# Patient Record
Sex: Female | Born: 1970
Health system: Southern US, Community
[De-identification: ages and names within clinical notes are randomized; demographics above are authoritative.]

## PROBLEM LIST (undated history)

## (undated) DIAGNOSIS — F419 Anxiety disorder, unspecified: Secondary | ICD-10-CM

## (undated) DIAGNOSIS — M792 Neuralgia and neuritis, unspecified: Secondary | ICD-10-CM

## (undated) DIAGNOSIS — K219 Gastro-esophageal reflux disease without esophagitis: Secondary | ICD-10-CM

## (undated) DIAGNOSIS — M1990 Unspecified osteoarthritis, unspecified site: Secondary | ICD-10-CM

## (undated) DIAGNOSIS — K589 Irritable bowel syndrome without diarrhea: Secondary | ICD-10-CM

## (undated) DIAGNOSIS — E079 Disorder of thyroid, unspecified: Secondary | ICD-10-CM

## (undated) DIAGNOSIS — J45909 Unspecified asthma, uncomplicated: Secondary | ICD-10-CM

## (undated) DIAGNOSIS — R42 Dizziness and giddiness: Secondary | ICD-10-CM

## (undated) DIAGNOSIS — R519 Headache, unspecified: Secondary | ICD-10-CM

## (undated) DIAGNOSIS — R112 Nausea with vomiting, unspecified: Secondary | ICD-10-CM

## (undated) DIAGNOSIS — Z9889 Other specified postprocedural states: Secondary | ICD-10-CM

## (undated) DIAGNOSIS — F429 Obsessive-compulsive disorder, unspecified: Secondary | ICD-10-CM

## (undated) HISTORY — DX: Unspecified asthma, uncomplicated: J45.909

## (undated) HISTORY — PX: WISDOM TOOTH EXTRACTION: SHX21

## (undated) HISTORY — PX: TUBAL LIGATION: SHX77

## (undated) HISTORY — DX: Unspecified osteoarthritis, unspecified site: M19.90

## (undated) HISTORY — DX: Anxiety disorder, unspecified: F41.9

## (undated) HISTORY — DX: Gastro-esophageal reflux disease without esophagitis: K21.9

## (undated) HISTORY — PX: TONSILLECTOMY: SUR1361

## (undated) HISTORY — DX: Disorder of thyroid, unspecified: E07.9

## (undated) HISTORY — DX: Obsessive-compulsive disorder, unspecified: F42.9

---

## 1998-11-30 ENCOUNTER — Other Ambulatory Visit: Admission: RE | Admit: 1998-11-30 | Discharge: 1998-11-30 | Payer: Self-pay | Admitting: Family Medicine

## 1999-12-27 ENCOUNTER — Encounter: Payer: Self-pay | Admitting: Family Medicine

## 1999-12-27 ENCOUNTER — Ambulatory Visit (HOSPITAL_COMMUNITY): Admission: RE | Admit: 1999-12-27 | Discharge: 1999-12-27 | Payer: Self-pay | Admitting: Family Medicine

## 2000-01-24 ENCOUNTER — Other Ambulatory Visit: Admission: RE | Admit: 2000-01-24 | Discharge: 2000-01-24 | Payer: Self-pay | Admitting: Family Medicine

## 2000-02-19 ENCOUNTER — Inpatient Hospital Stay (HOSPITAL_COMMUNITY): Admission: AD | Admit: 2000-02-19 | Discharge: 2000-02-19 | Payer: Self-pay | Admitting: Family Medicine

## 2000-03-05 ENCOUNTER — Ambulatory Visit (HOSPITAL_COMMUNITY): Admission: RE | Admit: 2000-03-05 | Discharge: 2000-03-05 | Payer: Self-pay | Admitting: Family Medicine

## 2000-03-05 ENCOUNTER — Encounter: Payer: Self-pay | Admitting: Family Medicine

## 2000-08-12 ENCOUNTER — Inpatient Hospital Stay (HOSPITAL_COMMUNITY): Admission: AD | Admit: 2000-08-12 | Discharge: 2000-08-12 | Payer: Self-pay | Admitting: Family Medicine

## 2000-08-14 ENCOUNTER — Inpatient Hospital Stay (HOSPITAL_COMMUNITY): Admission: AD | Admit: 2000-08-14 | Discharge: 2000-08-17 | Payer: Self-pay | Admitting: Family Medicine

## 2001-08-30 ENCOUNTER — Encounter: Payer: Self-pay | Admitting: Family Medicine

## 2001-08-30 ENCOUNTER — Inpatient Hospital Stay (HOSPITAL_COMMUNITY): Admission: AD | Admit: 2001-08-30 | Discharge: 2001-08-30 | Payer: Self-pay | Admitting: Family Medicine

## 2001-09-14 ENCOUNTER — Other Ambulatory Visit: Admission: RE | Admit: 2001-09-14 | Discharge: 2001-09-14 | Payer: Self-pay | Admitting: Family Medicine

## 2001-12-21 ENCOUNTER — Ambulatory Visit (HOSPITAL_COMMUNITY): Admission: RE | Admit: 2001-12-21 | Discharge: 2001-12-21 | Payer: Self-pay | Admitting: Family Medicine

## 2001-12-21 ENCOUNTER — Encounter: Payer: Self-pay | Admitting: Family Medicine

## 2002-02-17 ENCOUNTER — Encounter: Payer: Self-pay | Admitting: Family Medicine

## 2002-02-17 ENCOUNTER — Ambulatory Visit (HOSPITAL_COMMUNITY): Admission: RE | Admit: 2002-02-17 | Discharge: 2002-02-17 | Payer: Self-pay | Admitting: Family Medicine

## 2002-06-23 ENCOUNTER — Inpatient Hospital Stay (HOSPITAL_COMMUNITY): Admission: AD | Admit: 2002-06-23 | Discharge: 2002-06-25 | Payer: Self-pay | Admitting: Family Medicine

## 2004-04-06 ENCOUNTER — Other Ambulatory Visit: Admission: RE | Admit: 2004-04-06 | Discharge: 2004-04-06 | Payer: Self-pay | Admitting: Family Medicine

## 2004-05-09 ENCOUNTER — Ambulatory Visit (HOSPITAL_COMMUNITY): Admission: RE | Admit: 2004-05-09 | Discharge: 2004-05-09 | Payer: Self-pay | Admitting: Family Medicine

## 2004-08-14 ENCOUNTER — Ambulatory Visit (HOSPITAL_COMMUNITY): Admission: RE | Admit: 2004-08-14 | Discharge: 2004-08-14 | Payer: Self-pay | Admitting: Family Medicine

## 2004-10-12 ENCOUNTER — Inpatient Hospital Stay (HOSPITAL_COMMUNITY): Admission: AD | Admit: 2004-10-12 | Discharge: 2004-10-13 | Payer: Self-pay | Admitting: Family Medicine

## 2004-12-14 ENCOUNTER — Ambulatory Visit: Payer: Self-pay | Admitting: Family Medicine

## 2004-12-14 ENCOUNTER — Inpatient Hospital Stay (HOSPITAL_COMMUNITY): Admission: AD | Admit: 2004-12-14 | Discharge: 2004-12-16 | Payer: Self-pay | Admitting: Family Medicine

## 2006-03-28 ENCOUNTER — Other Ambulatory Visit: Admission: RE | Admit: 2006-03-28 | Discharge: 2006-03-28 | Payer: Self-pay | Admitting: Family Medicine

## 2011-04-23 ENCOUNTER — Other Ambulatory Visit: Payer: Self-pay | Admitting: Obstetrics and Gynecology

## 2011-04-23 DIAGNOSIS — N6452 Nipple discharge: Secondary | ICD-10-CM

## 2011-05-14 ENCOUNTER — Ambulatory Visit
Admission: RE | Admit: 2011-05-14 | Discharge: 2011-05-14 | Disposition: A | Discharge status: Home / Self Care | Payer: 59 | Source: Ambulatory Visit | Attending: Obstetrics and Gynecology | Admitting: Obstetrics and Gynecology

## 2011-05-14 DIAGNOSIS — N6452 Nipple discharge: Secondary | ICD-10-CM

## 2011-09-12 ENCOUNTER — Other Ambulatory Visit: Payer: Self-pay | Admitting: Family Medicine

## 2011-09-12 ENCOUNTER — Ambulatory Visit (HOSPITAL_COMMUNITY)
Admission: RE | Admit: 2011-09-12 | Discharge: 2011-09-12 | Disposition: A | Discharge status: Home / Self Care | Payer: 59 | Source: Ambulatory Visit | Attending: Family Medicine | Admitting: Family Medicine

## 2011-09-12 DIAGNOSIS — M79605 Pain in left leg: Secondary | ICD-10-CM

## 2011-09-12 DIAGNOSIS — M79609 Pain in unspecified limb: Secondary | ICD-10-CM | POA: Insufficient documentation

## 2011-09-12 DIAGNOSIS — M7989 Other specified soft tissue disorders: Secondary | ICD-10-CM

## 2011-09-27 ENCOUNTER — Other Ambulatory Visit: Payer: Self-pay | Admitting: Family Medicine

## 2011-09-27 DIAGNOSIS — Z1231 Encounter for screening mammogram for malignant neoplasm of breast: Secondary | ICD-10-CM

## 2011-12-06 ENCOUNTER — Ambulatory Visit: Payer: 59 | Attending: Physician Assistant | Admitting: Physical Therapy

## 2011-12-06 DIAGNOSIS — R5381 Other malaise: Secondary | ICD-10-CM | POA: Insufficient documentation

## 2011-12-06 DIAGNOSIS — M25569 Pain in unspecified knee: Secondary | ICD-10-CM | POA: Insufficient documentation

## 2011-12-06 DIAGNOSIS — IMO0001 Reserved for inherently not codable concepts without codable children: Secondary | ICD-10-CM | POA: Insufficient documentation

## 2011-12-10 ENCOUNTER — Encounter: Payer: 59 | Admitting: Physical Therapy

## 2012-01-16 ENCOUNTER — Ambulatory Visit
Admission: RE | Admit: 2012-01-16 | Discharge: 2012-01-16 | Disposition: A | Discharge status: Home / Self Care | Payer: 59 | Source: Ambulatory Visit | Attending: Family Medicine | Admitting: Family Medicine

## 2012-01-16 DIAGNOSIS — Z1231 Encounter for screening mammogram for malignant neoplasm of breast: Secondary | ICD-10-CM

## 2012-05-25 ENCOUNTER — Encounter (INDEPENDENT_AMBULATORY_CARE_PROVIDER_SITE_OTHER): Payer: Self-pay | Admitting: Surgery

## 2012-05-26 ENCOUNTER — Encounter (INDEPENDENT_AMBULATORY_CARE_PROVIDER_SITE_OTHER): Payer: Self-pay | Admitting: Surgery

## 2012-05-26 ENCOUNTER — Ambulatory Visit (INDEPENDENT_AMBULATORY_CARE_PROVIDER_SITE_OTHER): Payer: Commercial Managed Care - PPO | Admitting: Surgery

## 2012-05-26 VITALS — BP 94/68 | HR 73 | Temp 98.6°F | Resp 18 | Ht 67.0 in | Wt 153.0 lb

## 2012-05-26 DIAGNOSIS — R599 Enlarged lymph nodes, unspecified: Secondary | ICD-10-CM

## 2012-05-26 DIAGNOSIS — R590 Localized enlarged lymph nodes: Secondary | ICD-10-CM

## 2012-05-26 NOTE — Progress Notes (Signed)
Subjective:     Patient ID: Katherine Hays, female   DOB: 1971-04-22, 41 y.o.   MRN: [id number]  HPI She is referred by Dr. Christell Constant for evaluation of a tender nodule in her left groin. She reports has been there approximately 2 months. She denies any trauma to her lower extremities. She has had no constitutional symptoms. She denies fevers or chills. She denies any erythema or drainage of this area.   Review of Systems     Objective:   Physical Exam On exam, there is a less than 1 cm large lymph node in her left groin. It is nontender today and there is no erythema    Assessment:     Left inguinal lymphadenopathy    Plan:     I believe this is a hyperplastic lymph node. She has no other adenopathy. I discussed expected management versus surgical excision for histologic evaluation. She wishes to follow this expectantly. I believe this is totally reasonable. She will give some more time and if it is still causing her discomfort, she will come back and see me and we'll readdress excision. Again, I have a CBC on her which is completely normal

## 2012-10-15 ENCOUNTER — Encounter: Payer: Self-pay | Admitting: Gastroenterology

## 2012-10-27 ENCOUNTER — Encounter: Payer: Self-pay | Admitting: Gastroenterology

## 2012-10-27 ENCOUNTER — Ambulatory Visit (INDEPENDENT_AMBULATORY_CARE_PROVIDER_SITE_OTHER): Payer: 59 | Admitting: Gastroenterology

## 2012-10-27 ENCOUNTER — Other Ambulatory Visit (INDEPENDENT_AMBULATORY_CARE_PROVIDER_SITE_OTHER): Payer: 59

## 2012-10-27 VITALS — BP 120/78 | HR 88 | Ht 66.5 in | Wt 157.0 lb

## 2012-10-27 DIAGNOSIS — R197 Diarrhea, unspecified: Secondary | ICD-10-CM

## 2012-10-27 LAB — CBC WITH DIFFERENTIAL/PLATELET
Basophils Relative: 0.5 % (ref 0.0–3.0)
Eosinophils Absolute: 0.2 10*3/uL (ref 0.0–0.7)
HCT: 38.3 % (ref 36.0–46.0)
MCHC: 33.8 g/dL (ref 30.0–36.0)
MCV: 88.1 fl (ref 78.0–100.0)
Monocytes Relative: 7.9 % (ref 3.0–12.0)
Neutro Abs: 2.7 10*3/uL (ref 1.4–7.7)
Neutrophils Relative %: 52.5 % (ref 43.0–77.0)
Platelets: 275 10*3/uL (ref 150.0–400.0)
RBC: 4.35 Mil/uL (ref 3.87–5.11)
RDW: 12.7 % (ref 11.5–14.6)
WBC: 5.1 10*3/uL (ref 4.5–10.5)

## 2012-10-27 LAB — COMPREHENSIVE METABOLIC PANEL
Calcium: 8.9 mg/dL (ref 8.4–10.5)
Glucose, Bld: 97 mg/dL (ref 70–99)
Potassium: 4 mEq/L (ref 3.5–5.1)
Sodium: 138 mEq/L (ref 135–145)

## 2012-10-27 MED ORDER — PEG-KCL-NACL-NASULF-NA ASC-C 100 G PO SOLR: 1.0000 | Freq: Once | ORAL | Status: DC

## 2012-10-27 NOTE — Progress Notes (Signed)
HPI: This is a    very pleasant 42 year old woman whom I am meeting for the first time today.  2007 diarrhea for 2 weeks, eventually proven to have salmonella.  Ever since then intermittent diarrhea.  Recently minor rectal incontinence.  No rectal bleeding.    She will have 2-3 loose, mushy, watery stools per day.  Very rarely solid stools. Minor cramping in RUQ.    Has not tried anything to slow down her stools not imodium or fiber or probiotics.  Nephew has crohn's  Colicy pains in RUQ, occurs 1 time per month, stays about 2-3 seconds.   Drinks 3-4 sodas, coffee in am, tea at lunch.  Non etoh drinker.   Review of systems: Pertinent positive and negative review of systems were noted in the above HPI section. Complete review of systems was performed and was otherwise normal.    Past Medical History  Diagnosis Date  . Anxiety   . Asthma     Past Surgical History  Procedure Laterality Date  . Tubal ligation    . Tonsillectomy      Current Outpatient Prescriptions  Medication Sig Dispense Refill  . ALPRAZolam (XANAX PO) Take by mouth.      . Sertraline HCl (ZOLOFT PO) Take by mouth.       No current facility-administered medications for this visit.    Allergies as of 10/27/2012 - Review Complete 10/27/2012  Allergen Reaction Noted  . Codeine  05/25/2012    Family History  Problem Relation Age of Onset  . Liver disease Father   . Pancreatic cancer Maternal Uncle   . Diabetes Maternal Grandfather   . Breast cancer Paternal Grandmother   . Heart disease Paternal Grandfather   . Hyperthyroidism Mother   . Hypertension Brother   . Crohn's disease      Nephew    History   Social History  . Marital Status: Married    Spouse Name: N/A    Number of Children: 3  . Years of Education: N/A   Occupational History  . Front Office [employer]   Social History Main Topics  . Smoking status: Never Smoker   . Smokeless tobacco: Never Used  . Alcohol Use: No  .  Drug Use: No  . Sexually Active: Not on file   Other Topics Concern  . Not on file   Social History Narrative  . No narrative on file       Physical Exam: BP 120/78  Pulse 88  Ht 5' 6.5" (1.689 m)  Wt 157 lb (71.215 kg)  BMI 24.96 kg/m2  LMP 10/20/2012 Constitutional: generally well-appearing Psychiatric: alert and oriented x3 Eyes: extraocular movements intact Mouth: oral pharynx moist, no lesions Neck: supple no lymphadenopathy Cardiovascular: heart regular rate and rhythm Lungs: clear to auscultation bilaterally Abdomen: soft, nontender, nondistended, no obvious ascites, no peritoneal signs, normal bowel sounds Extremities: no lower extremity edema bilaterally Skin: no lesions on visible extremities    Assessment and plan: 43 y.o. 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 She does have Crohn's in her family and inflammatory conditions could cause similar problems as hers. She drinks 6 caffeinated beverages per day and that can also contribute loose stools. I would like to proceed with colonoscopy at her soonest convenience. In the meantime she will try to cut back on her caffeine and then possibly try a single Imodium once daily. She will get a basic set of labs today  including CBC, complete metabolic profile, celiac sprue panel, stool testing.

## 2012-10-27 NOTE — Patient Instructions (Addendum)
You will be set up for a colonoscopy for diarrhea (LEC moderate sedation). You will have labs checked today in the basement lab.  Please head down after you check out with the front desk  (stool tests for c. Diff by pcr, routine culture, ova parasites, celiac panel, cbc, cmet). Caffeine can contribute to diarrhea, try cutting back if you can. If cutting back on caffeine doesn't help, then try a single OTC imodium once every morning.                                                We are excited to introduce MyChart, a new best-in-class service that provides you online access to important information in your electronic medical record. We want to make it easier for you to view your health information - all in one secure location - when and where you need it. We expect MyChart will enhance the quality of care and service we provide.  When you register for MyChart, you can:    View your test results.    Request appointments and receive appointment reminders via email.    Request medication renewals.    View your medical history, allergies, medications and immunizations.    Communicate with your physician's office through a password-protected site.    Conveniently print information such as your medication lists.  To find out if MyChart is right for you, please talk to a member of our clinical staff today. We will gladly answer your questions about this free health and wellness tool.  If you are age 30 or older and want a member of your family to have access to your record, you must provide written consent by completing a proxy form available at our office. Please speak to our clinical staff about guidelines regarding accounts for patients younger than age 56.  As you activate your MyChart account and need any technical assistance, please call the MyChart technical support line at [phone]) or email your question to [email or web address]. If you email your question(s),  please include your name, a return phone number and the best time to reach you.  If you have non-urgent health-related questions, you can send a message to our office through MyChart at [email or web address]. If you have a medical emergency, call 911.  Thank you for using MyChart as your new health and wellness resource!   MyChart licensed from [employer],  [id number]. Patents Pending.

## 2012-10-28 LAB — CELIAC PANEL 10
Endomysial Screen: NEGATIVE
Gliadin IgA: 4.8 U/mL (ref <20)
Gliadin IgG: 10.8 U/mL (ref <20)
IgA: 167 mg/dL (ref 69–380)
Tissue Transglut Ab: 7.2 U/mL (ref <20)
Tissue Transglutaminase Ab, IgA: 3.8 U/mL (ref <20)

## 2012-11-02 ENCOUNTER — Encounter: Payer: Self-pay | Admitting: Gastroenterology

## 2012-11-02 ENCOUNTER — Ambulatory Visit (AMBULATORY_SURGERY_CENTER): Payer: 59 | Admitting: Gastroenterology

## 2012-11-02 VITALS — BP 98/58 | HR 67 | Temp 98.3°F | Resp 18 | Ht 66.5 in | Wt 157.0 lb

## 2012-11-02 DIAGNOSIS — D126 Benign neoplasm of colon, unspecified: Secondary | ICD-10-CM

## 2012-11-02 DIAGNOSIS — R197 Diarrhea, unspecified: Secondary | ICD-10-CM

## 2012-11-02 MED ORDER — SODIUM CHLORIDE 0.9 % IV SOLN: 500.0000 mL | INTRAVENOUS | Status: DC

## 2012-11-02 NOTE — Patient Instructions (Addendum)
YOU HAD AN ENDOSCOPIC PROCEDURE TODAY AT THE [facility]: Refer to the procedure report that was given to you for any specific questions about what was found during the examination.  If the procedure report does not answer your questions, please call your gastroenterologist to clarify.  If you requested that your care partner not be given the details of your procedure findings, then the procedure report has been included in a sealed envelope for you to review at your convenience later.  YOU SHOULD EXPECT: Some feelings of bloating in the abdomen. Passage of more gas than usual.  Walking can help get rid of the air that was put into your GI tract during the procedure and reduce the bloating. If you had a lower endoscopy (such as a colonoscopy or flexible sigmoidoscopy) you may notice spotting of blood in your stool or on the toilet paper. If you underwent a bowel prep for your procedure, then you may not have a normal bowel movement for a few days.  DIET: Your first meal following the procedure should be a light meal and then it is ok to progress to your normal diet.  A half-sandwich or bowl of soup is an example of a good first meal.  Heavy or fried foods are harder to digest and may make you feel nauseous or bloated.  Likewise meals heavy in dairy and vegetables can cause extra gas to form and this can also increase the bloating.  Drink plenty of fluids but you should avoid alcoholic beverages for 24 hours.  ACTIVITY: Your care partner should take you home directly after the procedure.  You should plan to take it easy, moving slowly for the rest of the day.  You can resume normal activity the day after the procedure however you should NOT DRIVE or use heavy machinery for 24 hours (because of the sedation medicines used during the test).    SYMPTOMS TO REPORT IMMEDIATELY: A gastroenterologist can be reached at any hour.  During normal business hours, 8:30 AM to 5:00 PM Monday through Friday,  call [phone].  After hours and on weekends, please call the GI answering service at [phone] who will take a message and have the physician on call contact you.   Following lower endoscopy (colonoscopy or flexible sigmoidoscopy):  Excessive amounts of blood in the stool  Significant tenderness or worsening of abdominal pains  Swelling of the abdomen that is new, acute  Fever of 100F or higher  Following upper endoscopy (EGD)  Vomiting of blood or coffee ground material  New chest pain or pain under the shoulder blades  Painful or persistently difficult swallowing  New shortness of breath  Fever of 100F or higher  Black, tarry-looking stools  FOLLOW UP: If any biopsies were taken you will be contacted by phone or by letter within the next 1-3 weeks.  Call your gastroenterologist if you have not heard about the biopsies in 3 weeks.  Our staff will call the home number listed on your records the next business day following your procedure to check on you and address any questions or concerns that you may have at that time regarding the information given to you following your procedure. This is a courtesy call and so if there is no answer at the home number and we have not heard from you through the emergency physician on call, we will assume that you have returned to your regular daily activities without incident.  SIGNATURES/CONFIDENTIALITY: You and/or your care   partner have signed paperwork which will be entered into your electronic medical record.  These signatures attest to the fact that that the information above on your After Visit Summary has been reviewed and is understood.  Full responsibility of the confidentiality of this discharge information lies with you and/or your care-partner.  

## 2012-11-02 NOTE — Op Note (Signed)
[facility] [address] [place], [address]   COLONOSCOPY PROCEDURE REPORT  PATIENT: Katherine Hays, Katherine Hays  MR#: [id number] BIRTHDATE: [birth year] , 41  yrs. old GENDER: Female ENDOSCOPIST: Rachael Fee, MD REFERRED XB:JYNW Sheron Nightingale, N.P. PROCEDURE DATE:  11/02/2012 PROCEDURE:   Colonoscopy with biopsy ASA CLASS:   Class II INDICATIONS:chronic loose stools, cousin with Crohn's. MEDICATIONS: Fentanyl 100 mcg IV, Versed 10 mg IV, and These medications were titrated to patient response per physician's verbal order  DESCRIPTION OF PROCEDURE:   After the risks benefits and alternatives of the procedure were thoroughly explained, informed consent was obtained.  A digital rectal exam revealed no abnormalities of the rectum.   The LB [id number]  endoscope was introduced through the anus and advanced to the terminal ileum which was intubated for a short distance. No adverse events experienced.   The quality of the prep was good.  The instrument was then slowly withdrawn as the colon was fully examined.   COLON FINDINGS: The mucosa appeared normal in the terminal ileum. A normal appearing cecum, ileocecal valve, and appendiceal orifice were identified.  The ascending, hepatic flexure, transverse, splenic flexure, descending, sigmoid colon and rectum appeared unremarkable.  No polyps or cancers were seen.  The colonic mucosa was randomly biopsied and sent to pathology.  Retroflexed views revealed no abnormalities. The time to cecum=4 minutes 38 seconds. Withdrawal time=7 minutes 00 seconds.  The scope was withdrawn and the procedure completed. COMPLICATIONS: There were no complications.  ENDOSCOPIC IMPRESSION: 1.   Normal mucosa in the terminal ileum 2.   Normal colon, biopsied to check for microscopic colitis.  RECOMMENDATIONS: Await final biopsy report.  Please start once daily imodium, one pill shortly after waking up every morning.   eSigned:   Rachael Fee, MD 11/02/2012 3:44 PM

## 2012-11-02 NOTE — Progress Notes (Signed)
Patient did not experience any of the following events: a burn prior to discharge; a fall within the facility; wrong site/side/patient/procedure/implant event; or a hospital transfer or hospital admission upon discharge from the facility. (G8907) Patient did not have preoperative order for IV antibiotic SSI prophylaxis. (G8918)  

## 2012-11-03 ENCOUNTER — Telehealth: Payer: Self-pay

## 2012-11-03 NOTE — Telephone Encounter (Signed)
  Follow up Call-  Call back number 11/02/2012  Post procedure Call Back phone  # [phone]  Permission to leave phone message Yes     Patient questions:  Do you have a fever, pain , or abdominal swelling? no Pain Score  0 *  Have you tolerated food without any problems? yes  Have you been able to return to your normal activities? yes  Do you have any questions about your discharge instructions: Diet   no Medications  no Follow up visit  no  Do you have questions or concerns about your Care? no  Actions: * If pain score is 4 or above: No action needed, pain <4.

## 2012-11-06 ENCOUNTER — Encounter: Payer: Self-pay | Admitting: Gastroenterology

## 2012-11-07 ENCOUNTER — Encounter: Payer: Self-pay | Admitting: Nurse Practitioner

## 2012-11-17 ENCOUNTER — Encounter: Payer: Self-pay | Admitting: *Deleted

## 2012-11-18 ENCOUNTER — Other Ambulatory Visit: Payer: Self-pay | Admitting: Obstetrics and Gynecology

## 2012-12-14 ENCOUNTER — Other Ambulatory Visit: Payer: Self-pay | Admitting: *Deleted

## 2012-12-14 MED ORDER — ALPRAZOLAM 0.5 MG PO TABS: ORAL_TABLET | ORAL | Status: DC

## 2012-12-14 NOTE — Telephone Encounter (Signed)
Called in.

## 2012-12-14 NOTE — Telephone Encounter (Signed)
LAST RF 09/18/12. LAST OV 08/28/12 WITH [facility]. PLEASE CALL IN AND LET PT KNOW

## 2012-12-14 NOTE — Telephone Encounter (Signed)
Please call in Xanax 0.5 mg 1 po bid #[phone] refills

## 2013-01-04 ENCOUNTER — Telehealth: Payer: Self-pay | Admitting: Nurse Practitioner

## 2013-01-04 MED ORDER — FLUCONAZOLE 150 MG PO TABS: 150.0000 mg | ORAL_TABLET | Freq: Once | ORAL | Status: DC

## 2013-01-04 NOTE — Telephone Encounter (Signed)
rx sent to pharmacy

## 2013-02-15 ENCOUNTER — Other Ambulatory Visit: Payer: Self-pay | Admitting: Nurse Practitioner

## 2013-02-16 NOTE — Telephone Encounter (Signed)
Last seen 08/2012   [facility]    If approved phone in and have nurse notify patient

## 2013-02-16 NOTE — Telephone Encounter (Signed)
Please call in xanax rx with 2 refills 

## 2013-02-16 NOTE — Telephone Encounter (Signed)
RX called to pharmacy.

## 2013-04-12 ENCOUNTER — Encounter: Payer: Self-pay | Admitting: General Practice

## 2013-04-12 ENCOUNTER — Ambulatory Visit (INDEPENDENT_AMBULATORY_CARE_PROVIDER_SITE_OTHER): Payer: 59 | Admitting: General Practice

## 2013-04-12 VITALS — BP 102/63 | HR 78 | Temp 97.1°F | Ht 67.0 in | Wt 159.0 lb

## 2013-04-12 DIAGNOSIS — J069 Acute upper respiratory infection, unspecified: Secondary | ICD-10-CM

## 2013-04-12 DIAGNOSIS — J322 Chronic ethmoidal sinusitis: Secondary | ICD-10-CM

## 2013-04-12 MED ORDER — AZITHROMYCIN 250 MG PO TABS: ORAL_TABLET | ORAL | Status: DC

## 2013-04-12 NOTE — Patient Instructions (Signed)

## 2013-04-12 NOTE — Progress Notes (Signed)
  Subjective:    Patient ID: Katherine Hays, female    DOB: [birth year], 42 y.o.   MRN: [id number]  Sinusitis This is a new problem. The current episode started yesterday. The problem has been gradually worsening since onset. There has been no fever. Associated symptoms include congestion, coughing, sinus pressure and sneezing. Pertinent negatives include no chills, headaches, shortness of breath or sore throat. Past treatments include nothing.  reports last menstrual cycle 2 weeks ago.     Review of Systems  Constitutional: Negative for fever and chills.  HENT: Positive for congestion, rhinorrhea, sneezing, postnasal drip and sinus pressure. Negative for sore throat.   Respiratory: Positive for cough. Negative for chest tightness and shortness of breath.   Cardiovascular: Negative for chest pain and palpitations.  Neurological: Negative for dizziness, weakness and headaches.       Objective:   Physical Exam  Constitutional: She is oriented to person, place, and time. She appears well-developed and well-nourished.  HENT:  Head: Normocephalic and atraumatic.  Right Ear: External ear normal.  Left Ear: External ear normal.  Nose: Right sinus exhibits maxillary sinus tenderness. Left sinus exhibits maxillary sinus tenderness.  Eyes: EOM are normal. Pupils are equal, round, and reactive to light.  Cardiovascular: Normal rate, regular rhythm and normal heart sounds.   Pulmonary/Chest: Effort normal and breath sounds normal.  Neurological: She is alert and oriented to person, place, and time.  Skin: Skin is warm and dry.  Psychiatric: She has a normal mood and affect.          Assessment & Plan:  1. Ethmoid sinusitis and 2. Acute upper respiratory infections of unspecified site - azithromycin (ZITHROMAX) 250 MG tablet; Take as directed  Dispense: 6 tablet; Refill: 0 -increase fluid intake -RTO if symptoms worsen or unresolved -Patient verbalized understanding Coralie Keens, FNP-C

## 2013-06-01 ENCOUNTER — Ambulatory Visit (INDEPENDENT_AMBULATORY_CARE_PROVIDER_SITE_OTHER): Payer: 59 | Admitting: *Deleted

## 2013-06-01 DIAGNOSIS — Z23 Encounter for immunization: Secondary | ICD-10-CM

## 2013-06-22 ENCOUNTER — Other Ambulatory Visit (HOSPITAL_COMMUNITY)
Admission: RE | Admit: 2013-06-22 | Discharge: 2013-06-22 | Disposition: A | Discharge status: Home / Self Care | Payer: 59 | Source: Ambulatory Visit | Attending: Advanced Practice Midwife | Admitting: Advanced Practice Midwife

## 2013-06-22 ENCOUNTER — Ambulatory Visit (INDEPENDENT_AMBULATORY_CARE_PROVIDER_SITE_OTHER): Payer: 59 | Admitting: Advanced Practice Midwife

## 2013-06-22 ENCOUNTER — Ambulatory Visit (INDEPENDENT_AMBULATORY_CARE_PROVIDER_SITE_OTHER): Payer: 59 | Admitting: General Practice

## 2013-06-22 ENCOUNTER — Encounter: Payer: Self-pay | Admitting: General Practice

## 2013-06-22 ENCOUNTER — Ambulatory Visit (INDEPENDENT_AMBULATORY_CARE_PROVIDER_SITE_OTHER): Payer: 59

## 2013-06-22 ENCOUNTER — Encounter: Payer: Self-pay | Admitting: Advanced Practice Midwife

## 2013-06-22 VITALS — BP 105/66 | HR 73 | Temp 97.0°F | Wt 156.0 lb

## 2013-06-22 VITALS — BP 110/70 | Ht 66.5 in | Wt 159.0 lb

## 2013-06-22 DIAGNOSIS — M25519 Pain in unspecified shoulder: Secondary | ICD-10-CM

## 2013-06-22 DIAGNOSIS — M542 Cervicalgia: Secondary | ICD-10-CM

## 2013-06-22 DIAGNOSIS — M25512 Pain in left shoulder: Secondary | ICD-10-CM

## 2013-06-22 DIAGNOSIS — Z01419 Encounter for gynecological examination (general) (routine) without abnormal findings: Secondary | ICD-10-CM | POA: Insufficient documentation

## 2013-06-22 DIAGNOSIS — IMO0002 Reserved for concepts with insufficient information to code with codable children: Secondary | ICD-10-CM

## 2013-06-22 DIAGNOSIS — Z1151 Encounter for screening for human papillomavirus (HPV): Secondary | ICD-10-CM | POA: Insufficient documentation

## 2013-06-22 DIAGNOSIS — S46912A Strain of unspecified muscle, fascia and tendon at shoulder and upper arm level, left arm, initial encounter: Secondary | ICD-10-CM

## 2013-06-22 MED ORDER — CYCLOBENZAPRINE HCL 5 MG PO TABS: 5.0000 mg | ORAL_TABLET | Freq: Two times a day (BID) | ORAL | Status: DC | PRN

## 2013-06-22 MED ORDER — IBUPROFEN 800 MG PO TABS: 800.0000 mg | ORAL_TABLET | Freq: Three times a day (TID) | ORAL | Status: DC | PRN

## 2013-06-22 NOTE — Progress Notes (Signed)
  Subjective:    Patient ID: Katherine Hays, female    DOB: 26-Dec-1970, 42 y.o.   MRN: [id number]  HPI Patient presents today with complaints of left neck and shoulder pain. She reports the onset as yesterday. Reports neck and shoulder pain began after attempting a shoulder roll in karate class. She reports taking ibuprofen last night with minimal relief.     Review of Systems  Constitutional: Negative for fever and chills.  Respiratory: Negative for chest tightness and shortness of breath.   Cardiovascular: Negative for chest pain and palpitations.  Musculoskeletal: Positive for myalgias and neck pain. Negative for back pain and neck stiffness.       Left neck and shoulder pain  Neurological: Negative for dizziness, weakness and headaches.       Objective:   Physical Exam  Constitutional: She is oriented to person, place, and time. She appears well-developed and well-nourished.  HENT:  Head: Normocephalic and atraumatic.  Neck: No thyromegaly present.  Grimacing noted with rotation of neck toward the left side  Cardiovascular: Normal rate, regular rhythm and normal heart sounds.   Pulmonary/Chest: Effort normal and breath sounds normal.  Musculoskeletal: She exhibits tenderness. She exhibits no edema.  Left trapezius muscle tenderness with palpation and rotation of neck  Lymphadenopathy:    She has no cervical adenopathy.  Neurological: She is alert and oriented to person, place, and time.  Skin: Skin is warm and dry.  Psychiatric: She has a normal mood and affect.   [facility] reading (PRIMARY) by Coralie Keens, FNP-C, no fracture or dislocation noted.                                       Assessment & Plan:  1. Neck pain on left side  - DG Cervical Spine Complete; Future  2. Left shoulder pain  - DG Cervical Spine Complete; Future  3. Muscle strain of left shoulder, initial encounter  - cyclobenzaprine (FLEXERIL) 5 MG tablet; Take 1 tablet (5 mg total) by mouth  2 (two) times daily as needed for muscle spasms.  Dispense: 30 tablet; Refill: 0 - ibuprofen (ADVIL,MOTRIN) 800 MG tablet; Take 1 tablet (800 mg total) by mouth every 8 (eight) hours as needed for pain.  Dispense: 30 tablet; Refill: 0 -information sheet provided and discussed on muscle strain -RTO if symptoms worsen or unresolved -Patient verbalized understanding Coralie Keens, FNP-C

## 2013-06-22 NOTE — Patient Instructions (Signed)
Muscle Strain  Muscle strain occurs when a muscle is stretched beyond its normal length. A small number of muscle fibers generally are torn. This is especially common in athletes. This happens when a sudden, violent force placed on a muscle stretches it too far. Usually, recovery from muscle strain takes 1 to 2 weeks. Complete healing will take 5 to 6 weeks.   HOME CARE INSTRUCTIONS    While awake, apply ice to the sore muscle for the first 2 days after the injury.   Put ice in a plastic bag.   Place a towel between your skin and the bag.   Leave the ice on for 15-20 minutes each hour.   Do not use the strained muscle for several days, until you no longer have pain.   You may wrap the injured area with an elastic bandage for comfort. Be careful not to wrap it too tightly. This may interfere with blood circulation or increase swelling.   Only take over-the-counter or prescription medicines for pain, discomfort, or fever as directed by your caregiver.  SEEK MEDICAL CARE IF:   You have increasing pain or swelling in the injured area.  MAKE SURE YOU:    Understand these instructions.   Will watch your condition.   Will get help right away if you are not doing well or get worse.  Document Released: 08/05/2005 Document Revised: 10/28/2011 Document Reviewed: 08/17/2011  ExitCare Patient Information 2014 [employer].

## 2013-06-22 NOTE — Progress Notes (Signed)
Katherine Hays 42 y.o.  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  Saw MD today for shoulder injury in Karate class.  Had colonoscopy this summer for chronic diarrhea s/p salmonella (gone after prep!).  Has been with same partner for 27 years.  Gets financial incentive for getting pap q year.   Past Medical History: Past Medical History  Diagnosis Date  . Anxiety   . Asthma   . OCD (obsessive compulsive disorder)     Past Surgical History: Past Surgical History  Procedure Laterality Date  . Tubal ligation    . Tonsillectomy    . Wisdom tooth extraction      Family History: Family History  Problem Relation Age of Onset  . Liver disease Father     cirrhosis  . Pancreatic cancer Maternal Uncle   . Diabetes Maternal Grandfather   . Stroke Maternal Grandfather   . Breast cancer Paternal Grandmother   . Cancer Paternal Grandmother     breast  . Heart disease Paternal Grandfather   . Stroke Paternal Grandfather   . Hyperthyroidism Mother   . Hypertension Brother   . Crohn's disease      Nephew    Social History: History  Substance Use Topics  . Smoking status: Never Smoker   . Smokeless tobacco: Never Used  . Alcohol Use: No    Allergies:  Allergies  Allergen Reactions  . Codeine       Review of Systems   Patient denies any headaches, blurred vision, shortness of breath, chest pain, abdominal pain, problems with bowel movements, urination, or intercourse.   Physical Exam: General:  Well developed, well nourished, no acute distress Skin:  Warm and dry Neck:  Midline trachea, normal thyroid Lungs; Clear to auscultation bilaterally Breast:  No dominant palpable mass, retraction, or nipple discharge.  Gets mammograms q year Cardiovascular: Regular rate and rhythm Abdomen:  Soft, non tender, no hepatosplenomegaly Pelvic:  External genitalia is normal in appearance.  The vagina is normal in appearance.   The cervix is bulbous.  Uterus is felt to be normal size, shape, and contour.  No       adnexal masses or tenderness noted. Rectal: declined   Extremities:  No swelling or varicosities noted Psych:  No mood changes   Impression:  Normal GYN exam  Plan: Will get fasting labs at work

## 2013-06-25 ENCOUNTER — Telehealth: Payer: Self-pay | Admitting: *Deleted

## 2013-06-25 MED ORDER — FLUCONAZOLE 150 MG PO TABS: 150.0000 mg | ORAL_TABLET | Freq: Once | ORAL | Status: DC

## 2013-06-25 NOTE — Telephone Encounter (Signed)
Would like a diflucan. Sure she has a yeast infection

## 2013-06-30 ENCOUNTER — Other Ambulatory Visit: Payer: Self-pay | Admitting: General Practice

## 2013-06-30 ENCOUNTER — Other Ambulatory Visit (INDEPENDENT_AMBULATORY_CARE_PROVIDER_SITE_OTHER): Payer: 59

## 2013-06-30 DIAGNOSIS — N390 Urinary tract infection, site not specified: Secondary | ICD-10-CM

## 2013-06-30 LAB — POCT URINALYSIS DIPSTICK
Bilirubin, UA: NEGATIVE
Glucose, UA: NEGATIVE
Ketones, UA: NEGATIVE
Urobilinogen, UA: NEGATIVE
pH, UA: 7.5

## 2013-06-30 LAB — POCT UA - MICROSCOPIC ONLY: Crystals, Ur, HPF, POC: NEGATIVE

## 2013-06-30 MED ORDER — CIPROFLOXACIN HCL 500 MG PO TABS: 500.0000 mg | ORAL_TABLET | Freq: Two times a day (BID) | ORAL | Status: DC

## 2013-06-30 NOTE — Progress Notes (Signed)
Employee bloodwork ordered in [facility]  Account billed

## 2013-06-30 NOTE — Addendum Note (Signed)
Addended by: Roselyn Reef on: 06/30/2013 08:45 AM   Modules accepted: Orders

## 2013-07-02 ENCOUNTER — Other Ambulatory Visit: Payer: Self-pay | Admitting: Nurse Practitioner

## 2013-07-02 LAB — CMP14+EGFR
ALT: 12 IU/L (ref 0–32)
AST: 19 IU/L (ref 0–40)
Albumin/Globulin Ratio: 1.8 (ref 1.1–2.5)
Albumin: 4.3 g/dL (ref 3.5–5.5)
Alkaline Phosphatase: 66 IU/L (ref 39–117)
BUN/Creatinine Ratio: 12 (ref 9–23)
CO2: 24 mmol/L (ref 18–29)
Chloride: 97 mmol/L (ref 97–108)
GFR calc Af Amer: 122 mL/min/{1.73_m2} (ref >59)
Potassium: 4.5 mmol/L (ref 3.5–5.2)
Sodium: 137 mmol/L (ref 134–144)
Total Bilirubin: 0.2 mg/dL (ref 0.0–1.2)

## 2013-07-02 LAB — CBC WITH DIFFERENTIAL
Basophils Absolute: 0 10*3/uL (ref 0.0–0.2)
Basos: 1 %
Eosinophils Absolute: 0.2 10*3/uL (ref 0.0–0.4)
Hemoglobin: 13.1 g/dL (ref 11.1–15.9)
Immature Grans (Abs): 0 10*3/uL (ref 0.0–0.1)
Lymphs: 28 %
MCHC: 32.7 g/dL (ref 31.5–35.7)
Monocytes: 9 %
Neutrophils Relative %: 59 %
WBC: 6.4 10*3/uL (ref 3.4–10.8)

## 2013-07-02 LAB — NMR, LIPOPROFILE
Cholesterol: 170 mg/dL (ref <200)
LDL Particle Number: 1368 nmol/L — ABNORMAL HIGH (ref <1000)
LDL Size: 20.8 nm (ref >20.5)
LDLC SERPL CALC-MCNC: 107 mg/dL — ABNORMAL HIGH (ref <100)
LP-IR Score: 44 (ref <=45)
Small LDL Particle Number: 523 nmol/L (ref <=527)

## 2013-07-02 LAB — THYROID PANEL WITH TSH
T3 Uptake Ratio: 28 % (ref 24–39)
T4, Total: 6.4 ug/dL (ref 4.5–12.0)

## 2013-07-02 LAB — URINE CULTURE

## 2013-07-05 ENCOUNTER — Telehealth: Payer: Self-pay | Admitting: Advanced Practice Midwife

## 2013-07-08 NOTE — Telephone Encounter (Signed)
Pt informed pap negative for HPV, negative for lesions and malignancy.

## 2013-07-26 ENCOUNTER — Encounter: Payer: Self-pay | Admitting: General Practice

## 2013-07-26 ENCOUNTER — Ambulatory Visit (INDEPENDENT_AMBULATORY_CARE_PROVIDER_SITE_OTHER): Payer: 59 | Admitting: General Practice

## 2013-07-26 VITALS — BP 117/70 | HR 82 | Temp 98.4°F

## 2013-07-26 DIAGNOSIS — R079 Chest pain, unspecified: Secondary | ICD-10-CM

## 2013-07-26 DIAGNOSIS — K219 Gastro-esophageal reflux disease without esophagitis: Secondary | ICD-10-CM

## 2013-07-26 MED ORDER — ESOMEPRAZOLE MAGNESIUM 20 MG PO PACK: 20.0000 mg | PACK | Freq: Every day | ORAL | Status: DC

## 2013-07-26 NOTE — Progress Notes (Signed)
   Subjective:    Patient ID: Katherine Hays, female    DOB: [birth year], 42 y.o.   MRN: [id number]  Gastrophageal Reflux She complains of belching, chest pain and heartburn. She reports no abdominal pain, no coughing, no nausea or no sore throat. This is a new problem. The current episode started 1 to 4 weeks ago. The problem occurs occasionally. The problem has been unchanged. The heartburn duration is several minutes. The heartburn is located in the substernum. The heartburn is of mild intensity. The heartburn does not wake her from sleep. The heartburn does not limit her activity. The heartburn doesn't change with position. The symptoms are aggravated by lying down and certain foods. Pertinent negatives include no anemia, fatigue, muscle weakness or orthopnea. There are no known risk factors. She has tried an antacid for the symptoms. The treatment provided moderate relief. Past procedures do not include an abdominal ultrasound or an EGD. Past invasive treatments do not include gastroplasty.      Review of Systems  Constitutional: Negative for chills and fatigue.  HENT: Negative for sore throat and trouble swallowing.   Respiratory: Negative for cough and chest tightness.   Cardiovascular: Positive for chest pain. Negative for palpitations.       Heartburn/indigestion sensation   Gastrointestinal: Positive for heartburn. Negative for nausea, vomiting, abdominal pain, diarrhea, constipation and blood in stool.  Genitourinary: Negative for dysuria and difficulty urinating.  Musculoskeletal: Negative for muscle weakness.  Neurological: Negative for dizziness, weakness and headaches.       Objective:   Physical Exam  Constitutional: She is oriented to person, place, and time. She appears well-developed and well-nourished.  HENT:  Head: Normocephalic and atraumatic.  Mouth/Throat: Oropharynx is clear and moist.  Neck: Normal range of motion. Neck supple. No thyromegaly present.    Cardiovascular: Normal rate, regular rhythm and normal heart sounds.   Pulmonary/Chest: Effort normal and breath sounds normal. No respiratory distress. She exhibits no tenderness.  Abdominal: Soft. Bowel sounds are normal. She exhibits no distension. There is no tenderness.  Lymphadenopathy:    She has no cervical adenopathy.  Neurological: She is alert and oriented to person, place, and time.  Skin: Skin is warm and dry.  Psychiatric: She has a normal mood and affect.     [facility] reading (PRIMARY) by Coralie Keens, FNP-C, no acute process noted.                       Assessment & Plan:  1. Chest pain  - EKG 12-Lead (normal sinus rhythm)  2. GERD (gastroesophageal reflux disease)  - esomeprazole (NEXIUM) 20 MG packet; Take 20 mg by mouth daily before breakfast.  Dispense: 16 each; Refill: 0 (lot # [id number], exp 09/2015, # 16 tablets) -gerd diet discussed and information sheet provided -RTO if symptoms worsen and follow up in two weeks -Patient verbalized understanding Coralie Keens, FNP-C

## 2013-07-26 NOTE — Patient Instructions (Signed)

## 2013-09-13 ENCOUNTER — Other Ambulatory Visit: Payer: Self-pay | Admitting: Nurse Practitioner

## 2013-09-27 ENCOUNTER — Other Ambulatory Visit: Payer: Self-pay | Admitting: Nurse Practitioner

## 2013-09-28 NOTE — Telephone Encounter (Signed)
Last seen 07/26/13  Mae  If approved route to nurse to call into  [place]  [phone]

## 2013-09-30 ENCOUNTER — Other Ambulatory Visit: Payer: Self-pay | Admitting: General Practice

## 2013-10-01 ENCOUNTER — Other Ambulatory Visit: Payer: Self-pay | Admitting: *Deleted

## 2013-10-01 NOTE — Telephone Encounter (Signed)
Patient last seen in office on 07-26-14. Rx last filled on 07-02-13 for #60. Please advise. If approved please route to Pool B so nurse can phone in to [place]

## 2013-10-03 MED ORDER — ALPRAZOLAM 0.5 MG PO TABS: 0.5000 mg | ORAL_TABLET | Freq: Every evening | ORAL | Status: DC | PRN

## 2013-10-03 NOTE — Telephone Encounter (Signed)
Please call in xanax with 1 refills 

## 2013-10-04 NOTE — Telephone Encounter (Signed)
rx called into pharmacy

## 2013-11-01 ENCOUNTER — Other Ambulatory Visit: Payer: Self-pay | Admitting: General Practice

## 2013-11-02 NOTE — Telephone Encounter (Signed)
Last seen 07/26/13  Mae  Requesting 90 day supply

## 2013-11-08 ENCOUNTER — Encounter: Payer: Self-pay | Admitting: Nurse Practitioner

## 2013-11-08 ENCOUNTER — Ambulatory Visit (INDEPENDENT_AMBULATORY_CARE_PROVIDER_SITE_OTHER): Payer: 59 | Admitting: Nurse Practitioner

## 2013-11-08 VITALS — BP 105/70 | HR 77 | Temp 97.7°F | Ht 66.5 in | Wt 159.0 lb

## 2013-11-08 DIAGNOSIS — R05 Cough: Secondary | ICD-10-CM

## 2013-11-08 DIAGNOSIS — J069 Acute upper respiratory infection, unspecified: Secondary | ICD-10-CM

## 2013-11-08 DIAGNOSIS — R509 Fever, unspecified: Secondary | ICD-10-CM

## 2013-11-08 DIAGNOSIS — R059 Cough, unspecified: Secondary | ICD-10-CM

## 2013-11-08 LAB — POCT INFLUENZA A/B
Influenza A, POC: NEGATIVE
Influenza B, POC: NEGATIVE

## 2013-11-08 MED ORDER — AZITHROMYCIN 250 MG PO TABS: ORAL_TABLET | ORAL | Status: DC

## 2013-11-08 NOTE — Progress Notes (Signed)
   Subjective:    Patient ID: Katherine Hays, female    DOB: [birth year], 43 y.o.   MRN: [id number]  HPI Patient children were sick over the weekend- thought maybe that her son had the flu and was given tamiflu- SHe has developed a low grade fever with cough and congestion. Wanted to make sure that she did not have the flu.    Review of Systems  HENT: Positive for congestion and sinus pressure. Negative for sore throat and trouble swallowing.   Respiratory: Positive for cough.   Cardiovascular: Negative.   Musculoskeletal: Negative.   Psychiatric/Behavioral: Negative.   All other systems reviewed and are negative.       Objective:   Physical Exam  Constitutional: She is oriented to person, place, and time. She appears well-developed and well-nourished.  HENT:  Right Ear: External ear normal.  Left Ear: External ear normal.  Nose: Nose normal.  Mouth/Throat: Oropharynx is clear and moist.  Eyes: Pupils are equal, round, and reactive to light.  Neck: Normal range of motion. Neck supple.  Cardiovascular: Normal rate, regular rhythm and normal heart sounds.   Pulmonary/Chest: Effort normal and breath sounds normal.  Abdominal: Soft. Bowel sounds are normal.  Lymphadenopathy:    She has no cervical adenopathy.  Neurological: She is alert and oriented to person, place, and time.  Skin: Skin is warm and dry.  Psychiatric: She has a normal mood and affect. Her behavior is normal. Judgment and thought content normal.   BP 105/70  Pulse 77  Temp(Src) 97.7 F (36.5 C) (Oral)  Ht 5' 6.5" (1.689 m)  Wt 159 lb (72.122 kg)  BMI 25.28 kg/m2 Results for orders placed in visit on 11/08/13  POCT INFLUENZA A/B      Result Value Ref Range   Influenza A, POC Negative     Influenza B, POC Negative            Assessment & Plan:   1. Fever, unspecified   2. Upper respiratory infection with cough and congestion    Meds ordered this encounter  Medications  . azithromycin  (ZITHROMAX Z-PAK) 250 MG tablet    Sig: As directed    Dispense:  6 each    Refill:  0    Order Specific Question:  Supervising Provider    Answer:  Chipper Herb [[id number]]   1. Take meds as prescribed 2. Use a cool mist humidifier especially during the winter months and when heat has been humid. 3. Use saline nose sprays frequently 4. Saline irrigations of the nose can be very helpful if done frequently.  * 4X daily for 1 week*  * Use of a nettie pot can be helpful with this. Follow directions with this* 5. Drink plenty of fluids 6. Keep thermostat turn down low 7.For any cough or congestion  Use plain Mucinex- regular strength or max strength is fine   * Children- consult with Pharmacist for dosing 8. For fever or aces or pains- take tylenol or ibuprofen appropriate for age and weight.  * for fevers greater than 101 orally you may alternate ibuprofen and tylenol every  3 hours.   Mary-Margaret Hassell Done, FNP

## 2013-11-08 NOTE — Patient Instructions (Signed)

## 2013-11-10 MED ORDER — METHYLPREDNISOLONE ACETATE 80 MG/ML IJ SUSP
80.0000 mg | Freq: Once | INTRAMUSCULAR | Status: AC
  Administered 2013-11-10: 80 mg via INTRAMUSCULAR

## 2013-11-10 NOTE — Addendum Note (Signed)
Addended by: Chevis Pretty on: 11/10/2013 09:23 AM   Modules accepted: Orders

## 2013-11-24 ENCOUNTER — Ambulatory Visit: Payer: 59 | Admitting: Nurse Practitioner

## 2013-12-08 ENCOUNTER — Other Ambulatory Visit: Payer: Self-pay | Admitting: Nurse Practitioner

## 2013-12-08 MED ORDER — DOXYCYCLINE HYCLATE 100 MG PO TABS: 100.0000 mg | ORAL_TABLET | Freq: Two times a day (BID) | ORAL | Status: DC

## 2013-12-28 ENCOUNTER — Other Ambulatory Visit (INDEPENDENT_AMBULATORY_CARE_PROVIDER_SITE_OTHER): Payer: 59

## 2013-12-28 ENCOUNTER — Other Ambulatory Visit: Payer: Self-pay | Admitting: *Deleted

## 2013-12-28 DIAGNOSIS — T148 Other injury of unspecified body region: Secondary | ICD-10-CM

## 2013-12-28 DIAGNOSIS — Z0000 Encounter for general adult medical examination without abnormal findings: Secondary | ICD-10-CM

## 2013-12-28 DIAGNOSIS — M2550 Pain in unspecified joint: Secondary | ICD-10-CM

## 2013-12-28 DIAGNOSIS — W57XXXA Bitten or stung by nonvenomous insect and other nonvenomous arthropods, initial encounter: Secondary | ICD-10-CM

## 2013-12-28 NOTE — Progress Notes (Signed)
Pt came in for labs only 

## 2013-12-30 LAB — LIPID PANEL
Chol/HDL Ratio: 4.2 ratio units (ref 0.0–4.4)
Cholesterol, Total: 173 mg/dL (ref 100–199)
HDL: 41 mg/dL (ref >39)
LDL Calculated: 113 mg/dL — ABNORMAL HIGH (ref 0–99)
Triglycerides: 93 mg/dL (ref 0–149)
VLDL Cholesterol Cal: 19 mg/dL (ref 5–40)

## 2013-12-30 LAB — CMP14+EGFR
ALBUMIN: 4.4 g/dL (ref 3.5–5.5)
ALT: 12 IU/L (ref 0–32)
AST: 18 IU/L (ref 0–40)
Albumin/Globulin Ratio: 2.3 (ref 1.1–2.5)
Alkaline Phosphatase: 62 IU/L (ref 39–117)
BILIRUBIN TOTAL: 0.3 mg/dL (ref 0.0–1.2)
BUN/Creatinine Ratio: 16 (ref 9–23)
BUN: 11 mg/dL (ref 6–24)
CO2: 23 mmol/L (ref 18–29)
CREATININE: 0.68 mg/dL (ref 0.57–1.00)
Calcium: 9.2 mg/dL (ref 8.7–10.2)
Chloride: 105 mmol/L (ref 97–108)
GFR calc non Af Amer: 107 mL/min/{1.73_m2} (ref >59)
GFR, EST AFRICAN AMERICAN: 123 mL/min/{1.73_m2} (ref >59)
Globulin, Total: 1.9 g/dL (ref 1.5–4.5)
Glucose: 91 mg/dL (ref 65–99)
Potassium: 4.4 mmol/L (ref 3.5–5.2)
Sodium: 143 mmol/L (ref 134–144)
TOTAL PROTEIN: 6.3 g/dL (ref 6.0–8.5)

## 2013-12-30 LAB — ARTHRITIS PANEL
Anti Nuclear Antibody(ANA): NEGATIVE
Rhuematoid fact SerPl-aCnc: 13.1 IU/mL (ref 0.0–13.9)
Sed Rate: 2 mm/hr (ref 0–32)
Uric Acid: 4.5 mg/dL (ref 2.5–7.1)

## 2013-12-30 LAB — ROCKY MTN SPOTTED FVR ABS PNL(IGG+IGM)
RMSF IgG: NEGATIVE
RMSF IgM: 0.7 index (ref 0.00–0.89)

## 2013-12-30 LAB — LYME AB/WESTERN BLOT REFLEX

## 2013-12-31 ENCOUNTER — Encounter: Payer: Self-pay | Admitting: Family Medicine

## 2013-12-31 ENCOUNTER — Ambulatory Visit (INDEPENDENT_AMBULATORY_CARE_PROVIDER_SITE_OTHER): Payer: 59 | Admitting: Family Medicine

## 2013-12-31 ENCOUNTER — Ambulatory Visit (INDEPENDENT_AMBULATORY_CARE_PROVIDER_SITE_OTHER): Payer: 59

## 2013-12-31 VITALS — BP 111/72 | HR 70 | Temp 99.1°F | Ht 65.0 in | Wt 157.0 lb

## 2013-12-31 DIAGNOSIS — M25519 Pain in unspecified shoulder: Secondary | ICD-10-CM

## 2013-12-31 DIAGNOSIS — M25512 Pain in left shoulder: Secondary | ICD-10-CM

## 2013-12-31 MED ORDER — NAPROXEN 500 MG PO TABS: 500.0000 mg | ORAL_TABLET | Freq: Two times a day (BID) | ORAL | Status: DC

## 2014-01-03 NOTE — Progress Notes (Signed)
   Subjective:    Patient ID: Katherine Hays, female    DOB: [birth year], 43 y.o.   MRN: [id number]  HPI  This 43 y.o. female presents for evaluation of c/o left shoulder pain.  Review of Systems    No chest pain, SOB, HA, dizziness, vision change, N/V, diarrhea, constipation, dysuria, urinary urgency or frequency, myalgias, arthralgias or rash.  Objective:   Physical Exam Vital signs noted  Well developed well nourished female.  HEENT - Head atraumatic Normocephalic Respiratory - Lungs CTA bilateral Cardiac - RRR S1 and S2 without murmur MS - TTP with rom left shoulder  Left shoulder xray - No fracture left shoulder     Assessment & Plan:  Left shoulder pain - Plan: DG Shoulder Left, naproxen (NAPROSYN) 500 MG tablet po bid x 10 days Circumduction exercises.  Lysbeth Penner FNP

## 2014-01-04 ENCOUNTER — Other Ambulatory Visit: Payer: Self-pay | Admitting: Family Medicine

## 2014-01-12 ENCOUNTER — Other Ambulatory Visit (INDEPENDENT_AMBULATORY_CARE_PROVIDER_SITE_OTHER): Payer: 59

## 2014-01-12 ENCOUNTER — Encounter: Payer: Self-pay | Admitting: Physician Assistant

## 2014-01-12 ENCOUNTER — Telehealth: Payer: Self-pay | Admitting: Gastroenterology

## 2014-01-12 ENCOUNTER — Ambulatory Visit (INDEPENDENT_AMBULATORY_CARE_PROVIDER_SITE_OTHER): Payer: 59 | Admitting: Physician Assistant

## 2014-01-12 VITALS — BP 102/74 | HR 82 | Ht 67.0 in | Wt 162.0 lb

## 2014-01-12 DIAGNOSIS — K625 Hemorrhage of anus and rectum: Secondary | ICD-10-CM

## 2014-01-12 DIAGNOSIS — R197 Diarrhea, unspecified: Secondary | ICD-10-CM

## 2014-01-12 DIAGNOSIS — F411 Generalized anxiety disorder: Secondary | ICD-10-CM | POA: Insufficient documentation

## 2014-01-12 DIAGNOSIS — Z8659 Personal history of other mental and behavioral disorders: Secondary | ICD-10-CM | POA: Insufficient documentation

## 2014-01-12 LAB — CBC WITH DIFFERENTIAL/PLATELET
BASOS ABS: 0 10*3/uL (ref 0.0–0.1)
BASOS PCT: 0.4 % (ref 0.0–3.0)
EOS ABS: 0.2 10*3/uL (ref 0.0–0.7)
Eosinophils Relative: 2.3 % (ref 0.0–5.0)
HEMATOCRIT: 37.6 % (ref 36.0–46.0)
HEMOGLOBIN: 12.7 g/dL (ref 12.0–15.0)
LYMPHS ABS: 2.1 10*3/uL (ref 0.7–4.0)
LYMPHS PCT: 30.1 % (ref 12.0–46.0)
MCHC: 33.8 g/dL (ref 30.0–36.0)
MCV: 89.3 fl (ref 78.0–100.0)
Monocytes Absolute: 0.7 10*3/uL (ref 0.1–1.0)
Monocytes Relative: 10.6 % (ref 3.0–12.0)
NEUTROS ABS: 4 10*3/uL (ref 1.4–7.7)
Neutrophils Relative %: 56.6 % (ref 43.0–77.0)
Platelets: 269 10*3/uL (ref 150.0–400.0)
RBC: 4.21 Mil/uL (ref 3.87–5.11)
RDW: 13 % (ref 11.5–15.5)
WBC: 7 10*3/uL (ref 4.0–10.5)

## 2014-01-12 LAB — HIGH SENSITIVITY CRP: CRP, High Sensitivity: 1.5 mg/L (ref 0.000–5.000)

## 2014-01-12 MED ORDER — HYDROCORTISONE ACETATE 25 MG RE SUPP: RECTAL | Status: DC

## 2014-01-12 MED ORDER — GLYCOPYRROLATE 2 MG PO TABS: ORAL_TABLET | ORAL | Status: DC

## 2014-01-12 NOTE — Patient Instructions (Signed)
Please go to the basement level to have your labs drawn.   We sent prescripions to [facility]. 1. Anusol HC Suppositories 2. Robinul Forte tablets for craming and loose stools.  Call us back if rectal bleeding reoccurs.

## 2014-01-12 NOTE — Progress Notes (Signed)
Subjective:    Patient ID: Katherine Hays, female    DOB: 1970-12-09, 43 y.o.   MRN: [id number]  HPI  Katherine Hays is a pleasant 43 year old white female known to Dr. Ardis Hughs. She had been seen in the past for complaints of diarrhea. She did have proven Salmonella in 2007. She underwent colonoscopy in March of 2014 this was a normal exam. She had random biopsies done as well to rule out microscopic colitis and these were negative. Celiac panel also negative. Patient comes in today stating that she felt somewhat better after the colonoscopy for few months and now over the past 6 months she feels that her symptoms have recurred. She says she has lower abdominal  discomfort and generally has 2 loose to liquid bowel movements per day. She says she has a lot of her emergency in very occasionally will have some seepage of stool. Yesterday while at work she developed abdominal cramping some urgency and had a loose stool which contained red blood and mucus. She has not had any recurrence since and has had no changes in her lower abdominal  discomfort. This episode specifically was not associated with diaphoresis nausea vomiting etc. Patient had been taking naproxen regularly for the past 3-4 days for shoulder pain no other regular aspirin or NSAIDs. Family history is pertinent for a cousin with Crohn's disease.    Review of Systems  Constitutional: Negative.   HENT: Negative.   Eyes: Negative.   Respiratory: Negative.   Gastrointestinal: Positive for diarrhea and blood in stool.  Endocrine: Negative.   Genitourinary: Negative.   Musculoskeletal: Negative.   Allergic/Immunologic: Negative.   Neurological: Negative.   Hematological: Negative.    Outpatient Prescriptions Prior to Visit  Medication Sig Dispense Refill  . ALPRAZolam (XANAX) 0.5 MG tablet Take 1 tablet (0.5 mg total) by mouth at bedtime as needed for anxiety.  60 tablet  0  . naproxen (NAPROSYN) 500 MG tablet Take 1 tablet (500 mg  total) by mouth 2 (two) times daily with a meal.  30 tablet  0  . sertraline (ZOLOFT) 50 MG tablet TAKE 1 TABLET BY MOUTH DAILY  90 tablet  0  . Albuterol Sulfate (PROAIR HFA IN) Inhale into the lungs as needed.      . ALPRAZolam (XANAX PO) Take 0.5 mg by mouth at bedtime.       . ALPRAZolam (XANAX) 0.5 MG tablet TAKE 1 TABLET BY MOUTH TWICE DAILY  60 tablet  2  . azithromycin (ZITHROMAX Z-PAK) 250 MG tablet As directed  6 each  0  . cyclobenzaprine (FLEXERIL) 5 MG tablet Take 1 tablet (5 mg total) by mouth 2 (two) times daily as needed for muscle spasms.  30 tablet  0  . doxycycline (VIBRA-TABS) 100 MG tablet Take 1 tablet (100 mg total) by mouth 2 (two) times daily.  28 tablet  0  . esomeprazole (NEXIUM) 20 MG packet Take 20 mg by mouth daily before breakfast.  16 each  0  . ibuprofen (ADVIL,MOTRIN) 800 MG tablet Take 1 tablet (800 mg total) by mouth every 8 (eight) hours as needed for pain.  30 tablet  0   No facility-administered medications prior to visit.   Allergies  Allergen Reactions  . Codeine        Patient Active Problem List   Diagnosis Date Noted  . History of OCD (obsessive compulsive disorder) 01/12/2014  . Anxiety state, unspecified 01/12/2014  . Inguinal lymphadenopathy 05/26/2012   History  Substance Use  Topics  . Smoking status: Never Smoker   . Smokeless tobacco: Never Used  . Alcohol Use: No   family history includes Breast cancer in her paternal grandmother; Cancer in her paternal grandmother; Crohn's disease in an other family member; Diabetes in her maternal grandfather; Heart disease in her paternal grandfather; Hypertension in her brother; Hyperthyroidism in her mother; Liver disease in her father; Pancreatic cancer in her maternal uncle; Stroke in her maternal grandfather and paternal grandfather.   Objective:   Physical Exam Well-developed white female in no acute distress, pleasant blood pressure 102/74 pulse 82 height 57 inches weight 162. HEENT;  nontraumatic normocephalic EOMI PERRLA sclera anicteric, Neck; supple no JVD, Cardiovascula;r regular rate and rhythm with S1-S2 no murmur or gallop, Pulmonary; clear bilaterally, Abdomen ;soft mildly tender in the lower abdomen, there is no palpable mass or hepatosplenomegaly, No guarding or rebound, Rectal; exam no external lesion noted digital exam reveals decreased anal sphincter tone no blood on the examining glove, no lesion felt, Extremities; no clubbing cyanosis or edema skin warm and dry, Psych ;mood and affect appropriate       Assessment & Plan:  #88  43 year old female with chronic mild diarrhea and lower abdominal discomfort, negative colonoscopy 1 year ago including random biopsies. 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 Prior celiac testing was also negative. #2 single episode of small volume hematochezia-etiology not clear probable local anal rectal source no evidence for proctitis on prior colonoscopy. #3 anxiety #4 decreased anal sphincter tone  Plan;We'll give patient a trial of Robinul Forte 2 mg by mouth every morning to see if this helps with urgency and loose stools #2 check CBC with differential and CRP #3 stop naproxen #4 Anusol-HC suppositories at bedtime x1 week then as needed. #5 patient is advised to call should she have recurrent episodes of rectal bleeding may need to consider flexible sigmoidoscopy.

## 2014-01-12 NOTE — Telephone Encounter (Signed)
The pt has been scheduled with Nicoletta Ba for today at 3 pm she is aware

## 2014-01-13 NOTE — Progress Notes (Signed)
i agree with the plan above 

## 2014-02-14 ENCOUNTER — Other Ambulatory Visit: Payer: Self-pay | Admitting: General Practice

## 2014-03-18 ENCOUNTER — Telehealth: Payer: Self-pay | Admitting: Nurse Practitioner

## 2014-03-18 MED ORDER — ALPRAZOLAM 0.5 MG PO TABS: 0.5000 mg | ORAL_TABLET | Freq: Every evening | ORAL | Status: DC | PRN

## 2014-03-18 NOTE — Telephone Encounter (Signed)
Please call in xanax 0.5 mg BID prn #60 with 1 refills

## 2014-03-19 NOTE — Telephone Encounter (Signed)
Called in.

## 2014-05-12 ENCOUNTER — Ambulatory Visit (INDEPENDENT_AMBULATORY_CARE_PROVIDER_SITE_OTHER): Payer: 59 | Admitting: Nurse Practitioner

## 2014-05-12 ENCOUNTER — Encounter: Payer: Self-pay | Admitting: Nurse Practitioner

## 2014-05-12 VITALS — BP 94/66 | HR 78 | Temp 97.0°F | Ht 67.0 in | Wt 161.0 lb

## 2014-05-12 DIAGNOSIS — J069 Acute upper respiratory infection, unspecified: Secondary | ICD-10-CM

## 2014-05-12 DIAGNOSIS — R0989 Other specified symptoms and signs involving the circulatory and respiratory systems: Secondary | ICD-10-CM

## 2014-05-12 DIAGNOSIS — J029 Acute pharyngitis, unspecified: Secondary | ICD-10-CM

## 2014-05-12 DIAGNOSIS — B9789 Other viral agents as the cause of diseases classified elsewhere: Secondary | ICD-10-CM

## 2014-05-12 LAB — POCT INFLUENZA A/B
INFLUENZA B, POC: NEGATIVE
Influenza A, POC: NEGATIVE

## 2014-05-12 LAB — POCT RAPID STREP A (OFFICE): Rapid Strep A Screen: NEGATIVE

## 2014-05-12 NOTE — Progress Notes (Signed)
   Subjective:    Patient ID: Katherine Hays, female    DOB: [birth year], 43 y.o.   MRN: [id number]  HPI  Patient in today c/o congestion, headache, achiness, slight nausea and diarrhea. This started 2 days ago and has gotten worse.    Review of Systems  Constitutional: Positive for appetite change and fatigue. Negative for fever.  HENT: Positive for congestion, ear pain, rhinorrhea and sore throat (intermittent).   Respiratory: Positive for cough (productive in the mornings).   Cardiovascular: Negative.   Gastrointestinal: Positive for nausea and diarrhea.  Musculoskeletal: Negative.   Neurological: Positive for headaches.  Psychiatric/Behavioral: Negative.   All other systems reviewed and are negative.      Objective:   Physical Exam  Constitutional: She is oriented to person, place, and time. She appears well-developed and well-nourished. No distress.  HENT:  Right Ear: Hearing, tympanic membrane, external ear and ear canal normal.  Left Ear: Hearing, tympanic membrane, external ear and ear canal normal.  Nose: Mucosal edema and rhinorrhea present. Right sinus exhibits no maxillary sinus tenderness and no frontal sinus tenderness. Left sinus exhibits no maxillary sinus tenderness and no frontal sinus tenderness.  Mouth/Throat: Uvula is midline and mucous membranes are normal. Posterior oropharyngeal erythema (mild) present.  Eyes: Pupils are equal, round, and reactive to light.  Neck: Normal range of motion. Neck supple.  Cardiovascular: Normal rate, regular rhythm and normal heart sounds.   Pulmonary/Chest: Effort normal and breath sounds normal.  Slight dry cough  Abdominal: Soft. Bowel sounds are normal.  Lymphadenopathy:    She has no cervical adenopathy.  Neurological: She is alert and oriented to person, place, and time.  Skin: Skin is warm and dry.  Psychiatric: She has a normal mood and affect. Her behavior is normal. Judgment and thought content normal.   BP  94/66  Pulse 78  Temp(Src) 97 F (36.1 C) (Oral)  Ht 5\' 7"  (1.702 m)  Wt 161 lb (73.029 kg)  BMI 25.21 kg/m2  . Results for orders placed in visit on 05/12/14  POCT RAPID STREP A (OFFICE)      Result Value Ref Range   Rapid Strep A Screen Negative  Negative  POCT INFLUENZA A/B      Result Value Ref Range   Influenza A, POC Negative     Influenza B, POC Negative           Assessment & Plan:   1. Sore throat   2. Chest congestion   3. Viral URI with cough    1. Take meds as prescribed 2. Use a cool mist humidifier especially during the winter months and when heat has been humid. 3. Use saline nose sprays frequently 4. Saline irrigations of the nose can be very helpful if done frequently.  * 4X daily for 1 week*  * Use of a nettie pot can be helpful with this. Follow directions with this* 5. Drink plenty of fluids 6. Keep thermostat turn down low 7.For any cough or congestion  Use plain Mucinex- regular strength or max strength is fine   * Children- consult with Pharmacist for dosing 8. For fever or aces or pains- take tylenol or ibuprofen appropriate for age and weight.  * for fevers greater than 101 orally you may alternate ibuprofen and tylenol every  3 hours.   Mary-Margaret Hassell Done, FNP

## 2014-05-12 NOTE — Patient Instructions (Signed)

## 2014-05-18 ENCOUNTER — Encounter: Payer: Self-pay | Admitting: Family Medicine

## 2014-05-18 ENCOUNTER — Encounter: Payer: Self-pay | Admitting: *Deleted

## 2014-05-18 ENCOUNTER — Ambulatory Visit (INDEPENDENT_AMBULATORY_CARE_PROVIDER_SITE_OTHER): Payer: 59 | Admitting: Family Medicine

## 2014-05-18 ENCOUNTER — Ambulatory Visit: Payer: 59 | Admitting: Family Medicine

## 2014-05-18 VITALS — BP 112/66 | HR 88 | Temp 99.5°F | Ht 67.0 in | Wt 161.0 lb

## 2014-05-18 DIAGNOSIS — R059 Cough, unspecified: Secondary | ICD-10-CM

## 2014-05-18 DIAGNOSIS — J209 Acute bronchitis, unspecified: Secondary | ICD-10-CM

## 2014-05-18 DIAGNOSIS — R05 Cough: Secondary | ICD-10-CM

## 2014-05-18 LAB — POCT CBC
Granulocyte percent: 70 %G (ref 37–80)
HEMATOCRIT: 37.8 % (ref 37.7–47.9)
HEMOGLOBIN: 12.7 g/dL (ref 12.2–16.2)
Lymph, poc: 2.5 (ref 0.6–3.4)
MCH: 30.1 pg (ref 27–31.2)
MCHC: 33.6 g/dL (ref 31.8–35.4)
MCV: 89.5 fL (ref 80–97)
MPV: 8.7 fL (ref 0–99.8)
POC Granulocyte: 6.4 (ref 2–6.9)
POC LYMPH %: 27.5 % (ref 10–50)
Platelet Count, POC: 297 10*3/uL (ref 142–424)
RBC: 4.2 M/uL (ref 4.04–5.48)
RDW, POC: 12.2 %
WBC: 9.2 10*3/uL (ref 4.6–10.2)

## 2014-05-18 MED ORDER — ALBUTEROL SULFATE (2.5 MG/3ML) 0.083% IN NEBU: 2.5000 mg | INHALATION_SOLUTION | Freq: Four times a day (QID) | RESPIRATORY_TRACT | Status: DC | PRN

## 2014-05-18 MED ORDER — BUDESONIDE-FORMOTEROL FUMARATE 80-4.5 MCG/ACT IN AERO: 2.0000 | INHALATION_SPRAY | Freq: Two times a day (BID) | RESPIRATORY_TRACT | Status: DC

## 2014-05-18 MED ORDER — AZITHROMYCIN 250 MG PO TABS: ORAL_TABLET | ORAL | Status: DC

## 2014-05-18 MED ORDER — PREDNISONE 10 MG PO TABS: ORAL_TABLET | ORAL | Status: DC

## 2014-05-18 NOTE — Patient Instructions (Addendum)
Buy OTC flonase  Take meds as directed Rinse mouth after using Symbicort!!! We will call you about CBC results Out of work today and tomorrow   Drink plenty of fluids Take antibiotic as directed Use inhaler as directed Take Tylenol as needed for aches pains and fever

## 2014-05-18 NOTE — Progress Notes (Signed)
Subjective:    Patient ID: Katherine Hays, female    DOB: [birth year], 43 y.o.   MRN: [id number]  HPI Patient here today for ongoing cough and congestion. The patient is coughing up purulent sputum which is yellow and green in color. She denies fever but does have a fever in the office today. She recently had a shot of Depo-Medrol.        Patient Active Problem List   Diagnosis Date Noted  . History of OCD (obsessive compulsive disorder) 01/12/2014  . Anxiety state, unspecified 01/12/2014  . Inguinal lymphadenopathy 05/26/2012   Outpatient Encounter Prescriptions as of 05/18/2014  Medication Sig  . ALPRAZolam (XANAX) 0.5 MG tablet Take 1 tablet (0.5 mg total) by mouth at bedtime as needed for anxiety.  . sertraline (ZOLOFT) 50 MG tablet TAKE 1 TABLET BY MOUTH DAILY  . glycopyrrolate (ROBINUL) 2 MG tablet Take 1 tab daily in the morning cramping and loose stools.  . hydrocortisone (ANUSOL-HC) 25 MG suppository Use 1 suppository per rectum x 7 days.  . [DISCONTINUED] naproxen (NAPROSYN) 500 MG tablet Take 1 tablet (500 mg total) by mouth 2 (two) times daily with a meal.    Review of Systems  Constitutional: Negative.  Negative for fever.  HENT: Positive for congestion, ear pain (pressure) and sinus pressure.   Eyes: Negative.   Respiratory: Positive for cough and wheezing.   Cardiovascular: Negative.   Gastrointestinal: Negative.   Endocrine: Negative.   Genitourinary: Negative.   Musculoskeletal: Negative.   Skin: Negative.   Allergic/Immunologic: Negative.   Neurological: Positive for headaches.  Hematological: Negative.   Psychiatric/Behavioral: Negative.        Objective:   Physical Exam  Nursing note and vitals reviewed. Constitutional: She is oriented to person, place, and time. She appears well-developed and well-nourished. No distress.  HENT:  Head: Normocephalic and atraumatic.  Right Ear: External ear normal.  Left Ear: External ear normal.    Mouth/Throat: Oropharynx is clear and moist.  Nasal congestion erythema and turbinate swelling bilateral, right red posteriorly without drainage, no sinus tenderness with pressure application, both TMs appear normal without redness  Eyes: Conjunctivae and EOM are normal. Pupils are equal, round, and reactive to light. Right eye exhibits no discharge. Left eye exhibits no discharge. No scleral icterus.  Neck: Normal range of motion. Neck supple. No JVD present. No thyromegaly present.  There are anterior cervical nodes bilaterally  Cardiovascular: Normal rate, regular rhythm and normal heart sounds.  Exam reveals no gallop and no friction rub.   No murmur heard. Pulmonary/Chest: Effort normal and breath sounds normal. No respiratory distress. She has no wheezes. She has no rales. She exhibits no tenderness.  There was no wheezing apparent but with coughing there was bronchial irritation bilaterally in each lung  Musculoskeletal: Normal range of motion. She exhibits no edema.  Lymphadenopathy:    She has cervical adenopathy.  Neurological: She is alert and oriented to person, place, and time.  Skin: Skin is warm and dry. No rash noted.  Psychiatric: She has a normal mood and affect. Her behavior is normal. Judgment and thought content normal.   BP 112/66  Pulse 88  Temp(Src) 99.5 F (37.5 C) (Oral)  Ht 5\' 7"  (1.702 m)  Wt 161 lb (73.029 kg)  BMI 25.21 kg/m2  LMP 05/04/2014  A CBC was done but results were not available when the patient left the office     Assessment & Plan:  1. Cough - POCT CBC  2. Acute bronchitis, unspecified organism  3. Bronchospasm with bronchitis, acute Meds ordered this encounter  Medications  . budesonide-formoterol (SYMBICORT) 80-4.5 MCG/ACT inhaler    Sig: Inhale 2 puffs into the lungs 2 (two) times daily.    Dispense:  1 Inhaler    Refill:  3  . predniSONE (DELTASONE) 10 MG tablet    Sig: TAPER- 1 tab by mouth four times daily for 2 days, 1 tab by  mouth three times daily for 2 days, 1 tab by mouth twice a day for 2 days, then 1 tab by mouth daily for 2 days, then stop.    Dispense:  20 tablet    Refill:  0  . albuterol (PROVENTIL) (2.5 MG/3ML) 0.083% nebulizer solution    Sig: Take 3 mLs (2.5 mg total) by nebulization every 6 (six) hours as needed for wheezing or shortness of breath.    Dispense:  150 mL    Refill:  1  . azithromycin (ZITHROMAX) 250 MG tablet    Sig: As directed    Dispense:  6 tablet    Refill:  0   Patient Instructions  Buy OTC flonase  Take meds as directed Rinse mouth after using Symbicort!!! We will call you about CBC results Out of work today and tomorrow   Drink plenty of fluids Take antibiotic as directed Use inhaler as directed Take Tylenol as needed for aches pains and fever   Arrie Senate MD

## 2014-06-07 ENCOUNTER — Other Ambulatory Visit: Payer: Self-pay | Admitting: Nurse Practitioner

## 2014-06-20 ENCOUNTER — Encounter: Payer: Self-pay | Admitting: Family Medicine

## 2014-08-04 ENCOUNTER — Telehealth: Payer: Self-pay | Admitting: *Deleted

## 2014-08-04 ENCOUNTER — Other Ambulatory Visit: Payer: Self-pay | Admitting: Nurse Practitioner

## 2014-08-04 NOTE — Telephone Encounter (Signed)
Please call in xanax with 1 refills 

## 2014-08-04 NOTE — Telephone Encounter (Signed)
Script for xanax called in.

## 2014-08-04 NOTE — Telephone Encounter (Signed)
Please advise on refill.

## 2014-08-05 NOTE — Telephone Encounter (Signed)
Phoned in.

## 2014-09-12 ENCOUNTER — Telehealth: Payer: Self-pay | Admitting: Nurse Practitioner

## 2014-09-12 MED ORDER — FLUCONAZOLE 150 MG PO TABS: 150.0000 mg | ORAL_TABLET | Freq: Once | ORAL | Status: DC

## 2014-09-12 NOTE — Telephone Encounter (Signed)
Diflucan rx sent to pharmacy.

## 2014-09-27 ENCOUNTER — Other Ambulatory Visit: Payer: Self-pay | Admitting: Nurse Practitioner

## 2014-09-27 MED ORDER — SERTRALINE HCL 50 MG PO TABS: 50.0000 mg | ORAL_TABLET | Freq: Every day | ORAL | Status: DC

## 2014-09-30 ENCOUNTER — Ambulatory Visit (INDEPENDENT_AMBULATORY_CARE_PROVIDER_SITE_OTHER): Payer: 59

## 2014-09-30 ENCOUNTER — Encounter: Payer: Self-pay | Admitting: Nurse Practitioner

## 2014-09-30 ENCOUNTER — Ambulatory Visit (INDEPENDENT_AMBULATORY_CARE_PROVIDER_SITE_OTHER): Payer: 59 | Admitting: Nurse Practitioner

## 2014-09-30 VITALS — BP 103/68 | HR 86 | Temp 97.0°F | Ht 67.0 in | Wt 161.0 lb

## 2014-09-30 DIAGNOSIS — M25511 Pain in right shoulder: Secondary | ICD-10-CM

## 2014-09-30 MED ORDER — MELOXICAM 15 MG PO TABS: 15.0000 mg | ORAL_TABLET | Freq: Every day | ORAL | Status: DC

## 2014-09-30 NOTE — Progress Notes (Signed)
   Subjective:    Patient ID: Katherine Hays, female    DOB: 12/19/1970, 44 y.o.   MRN: [id number]  HPI Patient in today c/o right shoulder pain- started 3-4 months ago- denies any injury tat she is aware of- she does exercise a lot and not sure if that has aggravated the problem. Rates pain 1-2/10. Intermittent- movement causes pain- Keeping it still stops pain. Denies any numbness or tingling distally. Has taken no meds.   Review of Systems  Constitutional: Negative.   HENT: Negative.   Respiratory: Negative.   Cardiovascular: Negative.   Genitourinary: Negative.   Neurological: Negative.   Psychiatric/Behavioral: Negative.   All other systems reviewed and are negative.      Objective:   Physical Exam  Constitutional: She is oriented to person, place, and time. She appears well-developed and well-nourished.  Cardiovascular: Normal rate, regular rhythm and normal heart sounds.   Pulmonary/Chest: Effort normal and breath sounds normal.  Musculoskeletal:  No actual point tenderness FROM passively no pain Active ROM causes pain with abduction and external  Rotation. Grips equal bil   Neurological: She is alert and oriented to person, place, and time. She has normal reflexes.  Skin: Skin is warm and dry.  Psychiatric: She has a normal mood and affect. Her behavior is normal. Judgment and thought content normal.   BP 103/68 mmHg  Pulse 86  Temp(Src) 97 F (36.1 C) (Oral)  Ht 5\' 7"  (1.702 m)  Wt 161 lb (73.029 kg)  BMI 25.21 kg/m2        Assessment & Plan:   1. Right shoulder pain    Meds ordered this encounter  Medications  . meloxicam (MOBIC) 15 MG tablet    Sig: Take 1 tablet (15 mg total) by mouth daily.    Dispense:  30 tablet    Refill:  3    Order Specific Question:  Supervising Provider    Answer:  Chipper Herb [[id number]]   Moist heat Rest rto prn  Mary-Margaret Hassell Done, FNP

## 2014-09-30 NOTE — Patient Instructions (Signed)

## 2014-11-08 ENCOUNTER — Ambulatory Visit: Payer: 59 | Admitting: Physician Assistant

## 2014-11-17 ENCOUNTER — Ambulatory Visit (INDEPENDENT_AMBULATORY_CARE_PROVIDER_SITE_OTHER): Payer: 59 | Admitting: Physician Assistant

## 2014-11-17 ENCOUNTER — Encounter: Payer: Self-pay | Admitting: Physician Assistant

## 2014-11-17 VITALS — BP 104/60 | HR 82 | Temp 97.7°F

## 2014-11-17 DIAGNOSIS — L918 Other hypertrophic disorders of the skin: Secondary | ICD-10-CM

## 2014-11-17 NOTE — Progress Notes (Signed)
   Subjective:    Patient ID: Katherine Hays, female    DOB: 1971-06-03, 44 y.o.   MRN: [id number]  HPI 44 y/o female presents for skin tag on right neck irritated by clothing and jewelry.     Review of Systems  All other systems reviewed and are negative.      Objective:   Physical Exam  Skin:  Flesh colored Skin tags on neck          Assessment & Plan:  1. Skin tags: benign appearing skin tag on right neck removed via shave excision. 2 other skin tags treated with cryosurgery. Advised patien tto use vaseline and otc hydrocortisone if needed for itch.

## 2014-11-22 ENCOUNTER — Ambulatory Visit (INDEPENDENT_AMBULATORY_CARE_PROVIDER_SITE_OTHER): Payer: 59 | Admitting: Family Medicine

## 2014-11-22 ENCOUNTER — Encounter: Payer: Self-pay | Admitting: Family Medicine

## 2014-11-22 VITALS — BP 104/66 | HR 65 | Temp 97.7°F | Ht 67.0 in | Wt 160.0 lb

## 2014-11-22 DIAGNOSIS — R1011 Right upper quadrant pain: Secondary | ICD-10-CM

## 2014-11-22 NOTE — Progress Notes (Signed)
   Subjective:    Patient ID: Katherine Hays, female    DOB: 1970-12-22, 44 y.o.   MRN: [id number]  HPI  44 year old female with right upper quadrant pain and nausea. Symptoms of been present for several days. She was given a GI cocktail in the office prior to being seen in that had no relief of her pain. The pain is described as colicky. There is no radiation but she does complain of some right shoulder pain but denies pain that interscapular Patient Active Problem List   Diagnosis Date Noted  . History of OCD (obsessive compulsive disorder) 01/12/2014  . Anxiety state, unspecified 01/12/2014  . Inguinal lymphadenopathy 05/26/2012   Outpatient Encounter Prescriptions as of 11/22/2014  Medication Sig  . albuterol (PROVENTIL) (2.5 MG/3ML) 0.083% nebulizer solution Take 3 mLs (2.5 mg total) by nebulization every 6 (six) hours as needed for wheezing or shortness of breath.  . ALPRAZolam (XANAX) 0.5 MG tablet TAKE 1 TABLET BY MOUTH TWICE DAILY  . sertraline (ZOLOFT) 50 MG tablet Take 1 tablet (50 mg total) by mouth daily.  . [DISCONTINUED] glycopyrrolate (ROBINUL) 2 MG tablet Take 1 tab daily in the morning cramping and loose stools.  . [DISCONTINUED] hydrocortisone (ANUSOL-HC) 25 MG suppository Use 1 suppository per rectum x 7 days.  . [DISCONTINUED] meloxicam (MOBIC) 15 MG tablet Take 1 tablet (15 mg total) by mouth daily.      Review of Systems  Gastrointestinal: Positive for nausea and abdominal pain.       Objective:   Physical Exam  Abdominal:  Exam confined to abdominal area. There is tenderness in the right upper quadrant but no palpable mass. Her abdomen is generally soft and benign. Bowel sounds are present.     BP 104/66 mmHg  Pulse 65  Temp(Src) 97.7 F (36.5 C) (Oral)  Ht 5\' 7"  (1.702 m)  Wt 160 lb (72.576 kg)  BMI 25.05 kg/m2  LMP 10/25/2014      Assessment & Plan:  1. RUQ pain Will send for ultrasound to rule out gallstones. The meantime I've asked her to  stay on bland or liquid diet. - US Abdomen Complete; Future

## 2014-11-23 ENCOUNTER — Ambulatory Visit (HOSPITAL_COMMUNITY)
Admission: RE | Admit: 2014-11-23 | Discharge: 2014-11-23 | Disposition: A | Discharge status: Home / Self Care | Payer: 59 | Source: Ambulatory Visit | Attending: Family Medicine | Admitting: Family Medicine

## 2014-11-23 ENCOUNTER — Other Ambulatory Visit: Payer: Self-pay | Admitting: Family Medicine

## 2014-11-23 ENCOUNTER — Ambulatory Visit: Payer: 59 | Admitting: Family Medicine

## 2014-11-23 DIAGNOSIS — R1011 Right upper quadrant pain: Secondary | ICD-10-CM | POA: Diagnosis present

## 2014-12-07 ENCOUNTER — Encounter: Payer: Self-pay | Admitting: Physician Assistant

## 2014-12-07 ENCOUNTER — Ambulatory Visit (INDEPENDENT_AMBULATORY_CARE_PROVIDER_SITE_OTHER): Payer: 59 | Admitting: Physician Assistant

## 2014-12-07 ENCOUNTER — Ambulatory Visit: Payer: 59 | Admitting: Physician Assistant

## 2014-12-07 VITALS — BP 113/68 | HR 76 | Temp 97.4°F | Ht 67.0 in | Wt 160.0 lb

## 2014-12-07 DIAGNOSIS — L219 Seborrheic dermatitis, unspecified: Secondary | ICD-10-CM

## 2014-12-07 DIAGNOSIS — L309 Dermatitis, unspecified: Secondary | ICD-10-CM

## 2014-12-07 DIAGNOSIS — L218 Other seborrheic dermatitis: Secondary | ICD-10-CM | POA: Diagnosis not present

## 2014-12-07 MED ORDER — KETOCONAZOLE 2 % EX SHAM: 1.0000 "application " | MEDICATED_SHAMPOO | CUTANEOUS | Status: DC

## 2014-12-07 MED ORDER — HYDROCORTISONE 2.5 % EX CREA: TOPICAL_CREAM | Freq: Two times a day (BID) | CUTANEOUS | Status: DC

## 2014-12-07 NOTE — Progress Notes (Signed)
   Subjective:    Patient ID: Katherine Hays, female    DOB: [birth year], 44 y.o.   MRN: [id number]  HPI 44 y/o female presents with c/o new appearing redness and rash on posterior neck x 2 days. She recently started a new    Review of Systems  Skin: Positive for color change (splotchy redness on back of neck) and rash.       Scaling in scalp       Objective:   Physical Exam  Skin:  Mild scale in scalp Blotching areas of erythema on posterior left neck. Borders are not demarcated No warmth or red streaking Slight scale          Assessment & Plan:  1. Seborrheic dermatitis of scalp  - ketoconazole (NIZORAL) 2 % shampoo; Apply 1 application topically 2 (two) times a week.  Dispense: 120 mL; Refill: 5  2. Dermatitis  - hydrocortisone 2.5 % cream; Apply topically 2 (two) times daily.  Dispense: 30 g; Refill: 0   Can take zyrtec 10mg  for itch q 24 hrs Change to [employer]  Stop using new shampoo   Voshon Petro A. Benjamin Stain PA-C

## 2014-12-07 NOTE — Progress Notes (Signed)
   Subjective:    Patient ID: Katherine Hays, female    DOB: November 12, 1970, 44 y.o.   MRN: [id number]  HPI    Review of Systems     Objective:   Physical Exam        Assessment & Plan:

## 2014-12-07 NOTE — Patient Instructions (Signed)
  USE DOVE SOAP SENSITIVE SKIN  CAN TAKE ZYRTEC 10MG  DAILY FOR ITCH IF NEEDED.      Seborrheic Dermatitis Seborrheic dermatitis involves pink or red skin with greasy, flaky scales. This is often found on the scalp, eyebrows, nose, bearded area, and on or behind the ears. It can also occur on the central chest. It often occurs where there are more oil (sebaceous) glands. This condition is also known as dandruff. When this condition affects a baby's scalp, it is called cradle cap. It may come and go for no known reason. It can occur at any time of life from infancy to old age. CAUSES  The cause is unknown. It is not the result of too little moisture or too much oil. In some people, seborrheic dermatitis flare-ups seem to be triggered by stress. It also commonly occurs in people with certain diseases such as Parkinson's disease or HIV/AIDS. SYMPTOMS   Thick scales on the scalp.  Redness on the face or in the armpits.  The skin may seem oily or dry, but moisturizers do not help.  In infants, seborrheic dermatitis appears as scaly redness that does not seem to bother the baby. In some babies, it affects only the scalp. In others, it also affects the neck creases, armpits, groin, or behind the ears.  In adults and adolescents, seborrheic dermatitis may affect only the scalp. It may look patchy or spread out, with areas of redness and flaking. Other areas commonly affected include:  Eyebrows.  Eyelids.  Forehead.  Skin behind the ears.  Outer ears.  Chest.  Armpits.  Nose creases.  Skin creases under the breasts.  Skin between the buttocks.  Groin.  Some adults and adolescents feel itching or burning in the affected areas. DIAGNOSIS  Your caregiver can usually tell what the problem is by doing a physical exam. TREATMENT   Cortisone (steroid) ointments, creams, and lotions can help decrease inflammation.  Babies can be treated with baby oil to soften the scales, then they  may be washed with baby shampoo. If this does not help, a prescription topical steroid medicine may work.  Adults can use medicated shampoos.  Your caregiver may prescribe corticosteroid cream and shampoo containing an antifungal or yeast medicine (ketoconazole). Hydrocortisone or anti-yeast cream can be rubbed directly onto seborrheic dermatitis patches. Yeast does not cause seborrheic dermatitis, but it seems to add to the problem. In infants, seborrheic dermatitis is often worst during the first year of life. It tends to disappear on its own as the child grows. However, it may return during the teenage years. In adults and adolescents, seborrheic dermatitis tends to be a long-lasting condition that comes and goes over many years. HOME CARE INSTRUCTIONS   Use prescribed medicines as directed.  In infants, do not aggressively remove the scales or flakes on the scalp with a comb or by other means. This may lead to hair loss. SEEK MEDICAL CARE IF:   The problem does not improve from the medicated shampoos, lotions, or other medicines given by your caregiver.  You have any other questions or concerns. Document Released: 08/05/2005 Document Revised: 02/04/2012 Document Reviewed: 12/25/2009 [facility] Patient Information 2015 [place], [place]. This information is not intended to replace advice given to you by your health care provider. Make sure you discuss any questions you have with your health care provider.

## 2014-12-22 ENCOUNTER — Encounter: Payer: Self-pay | Admitting: Physician Assistant

## 2014-12-22 ENCOUNTER — Ambulatory Visit: Payer: 59 | Admitting: Nurse Practitioner

## 2014-12-22 ENCOUNTER — Ambulatory Visit (INDEPENDENT_AMBULATORY_CARE_PROVIDER_SITE_OTHER): Payer: 59 | Admitting: Physician Assistant

## 2014-12-22 ENCOUNTER — Ambulatory Visit (INDEPENDENT_AMBULATORY_CARE_PROVIDER_SITE_OTHER): Payer: 59

## 2014-12-22 VITALS — BP 101/65 | HR 73 | Temp 97.2°F | Ht 67.0 in | Wt 160.0 lb

## 2014-12-22 DIAGNOSIS — R059 Cough, unspecified: Secondary | ICD-10-CM

## 2014-12-22 DIAGNOSIS — R05 Cough: Secondary | ICD-10-CM

## 2014-12-22 DIAGNOSIS — J069 Acute upper respiratory infection, unspecified: Secondary | ICD-10-CM | POA: Diagnosis not present

## 2014-12-22 MED ORDER — AZITHROMYCIN 250 MG PO TABS: ORAL_TABLET | ORAL | Status: DC

## 2014-12-22 MED ORDER — PREDNISONE 10 MG (21) PO TBPK: ORAL_TABLET | ORAL | Status: DC

## 2014-12-22 MED ORDER — ALBUTEROL SULFATE HFA 108 (90 BASE) MCG/ACT IN AERS
2.0000 | INHALATION_SPRAY | Freq: Four times a day (QID) | RESPIRATORY_TRACT | Status: DC | PRN
Start: 2014-12-22 — End: 2015-02-16

## 2014-12-22 MED ORDER — BENZONATATE 100 MG PO CAPS: 100.0000 mg | ORAL_CAPSULE | Freq: Three times a day (TID) | ORAL | Status: DC | PRN

## 2014-12-22 NOTE — Progress Notes (Addendum)
   Subjective:    Patient ID: Katherine Hays, female    DOB: 11-Jan-1971, 44 y.o.   MRN: [id number]  HPI 44 y/o female presents with c/o cough x 1 week. Has tried Musinex, albuterol inhaler and theraflu with no relief.     Review of Systems  Constitutional: Positive for chills. Negative for fever, diaphoresis and fatigue.  HENT: Positive for congestion (nasal and chest), postnasal drip, rhinorrhea, sinus pressure and sneezing. Negative for ear pain and sore throat.   Eyes: Negative.   Respiratory: Positive for cough (productive, worse at night ) and shortness of breath (episodic with cough ). Negative for wheezing.   Cardiovascular: Negative.   Gastrointestinal: Positive for diarrhea (chronic).  All other systems reviewed and are negative.      Objective:   Physical Exam  Constitutional: She is oriented to person, place, and time. She appears well-developed and well-nourished. No distress.  HENT:  Head: Normocephalic.  Cardiovascular: Normal rate, regular rhythm, normal heart sounds and intact distal pulses.  Exam reveals no gallop and no friction rub.   No murmur heard. Pulmonary/Chest: Effort normal. No respiratory distress. She has no wheezes.  Auditory crackles on anterior lobes bilaterally , more prominent on right anterior   Neurological: She is alert and oriented to person, place, and time.  Skin: She is not diaphoretic.  Psychiatric: She has a normal mood and affect. Her behavior is normal. Judgment and thought content normal.  Nursing note and vitals reviewed.         Assessment & Plan:  1. Cough  - DG Chest 2 View; WNL - albuterol (PROVENTIL HFA;VENTOLIN HFA) 108 (90 BASE) MCG/ACT inhaler; Inhale 2 puffs into the lungs every 6 (six) hours as needed for wheezing or shortness of breath.  Dispense: 1 Inhaler; Refill: 0 - predniSONE (STERAPRED UNI-PAK 21 TAB) 10 MG (21) TBPK tablet; Take as directed  Dispense: 21 tablet; Refill: 0 - azithromycin (ZITHROMAX) 250 MG  tablet; 2 pills on day 1, 1 pill on day 2-5  Dispense: 6 tablet; Refill: 0 - benzonatate (TESSALON) 100 MG capsule; Take 1 capsule (100 mg total) by mouth 3 (three) times daily as needed for cough.  Dispense: 21 capsule; Refill: 0  2. Acute upper respiratory infection  - albuterol (PROVENTIL HFA;VENTOLIN HFA) 108 (90 BASE) MCG/ACT inhaler; Inhale 2 puffs into the lungs every 6 (six) hours as needed for wheezing or shortness of breath.  Dispense: 1 Inhaler; Refill: 0 - predniSONE (STERAPRED UNI-PAK 21 TAB) 10 MG (21) TBPK tablet; Take as directed  Dispense: 21 tablet; Refill: 0 - azithromycin (ZITHROMAX) 250 MG tablet; 2 pills on day 1, 1 pill on day 2-5  Dispense: 6 tablet; Refill: 0 - benzonatate (TESSALON) 100 MG capsule; Take 1 capsule (100 mg total) by mouth 3 (three) times daily as needed for cough.  Dispense: 21 capsule; Refill: 0     RTO prn if symptoms worsen or dni.   Durwin Davisson A. Benjamin Stain PA-C

## 2014-12-22 NOTE — Patient Instructions (Signed)
Take OTC plain musinex - Drink plenty of water. Avoid caffienated drinks.

## 2014-12-23 ENCOUNTER — Emergency Department (HOSPITAL_COMMUNITY): Payer: 59

## 2014-12-23 ENCOUNTER — Emergency Department (HOSPITAL_COMMUNITY)
Admission: EM | Admit: 2014-12-23 | Discharge: 2014-12-24 | Disposition: A | Discharge status: Home / Self Care | Payer: 59 | Attending: Emergency Medicine | Admitting: Emergency Medicine

## 2014-12-23 ENCOUNTER — Encounter (HOSPITAL_COMMUNITY): Payer: Self-pay

## 2014-12-23 DIAGNOSIS — R002 Palpitations: Secondary | ICD-10-CM

## 2014-12-23 DIAGNOSIS — Z79899 Other long term (current) drug therapy: Secondary | ICD-10-CM | POA: Insufficient documentation

## 2014-12-23 DIAGNOSIS — I4949 Other premature depolarization: Secondary | ICD-10-CM | POA: Diagnosis not present

## 2014-12-23 DIAGNOSIS — J45909 Unspecified asthma, uncomplicated: Secondary | ICD-10-CM | POA: Insufficient documentation

## 2014-12-23 DIAGNOSIS — F419 Anxiety disorder, unspecified: Secondary | ICD-10-CM | POA: Insufficient documentation

## 2014-12-23 DIAGNOSIS — R079 Chest pain, unspecified: Secondary | ICD-10-CM | POA: Diagnosis present

## 2014-12-23 DIAGNOSIS — R0789 Other chest pain: Secondary | ICD-10-CM

## 2014-12-23 NOTE — ED Notes (Signed)
Pt states she has had an upper resp infection for several days, states she started having some intermittent midsternal chest discomfort with some occasional palpitations.  Pt appears anxious

## 2014-12-24 LAB — BASIC METABOLIC PANEL
Anion gap: 7 (ref 5–15)
BUN: 13 mg/dL (ref 6–20)
CALCIUM: 9.2 mg/dL (ref 8.9–10.3)
CO2: 25 mmol/L (ref 22–32)
Chloride: 108 mmol/L (ref 101–111)
Creatinine, Ser: 0.62 mg/dL (ref 0.44–1.00)
GFR calc Af Amer: >60 mL/min (ref >60)
Glucose, Bld: 138 mg/dL — ABNORMAL HIGH (ref 70–99)
Potassium: 4.2 mmol/L (ref 3.5–5.1)
SODIUM: 140 mmol/L (ref 135–145)

## 2014-12-24 LAB — TROPONIN I

## 2014-12-24 MED ORDER — HYDROCODONE-ACETAMINOPHEN 5-325 MG PO TABS
1.0000 | ORAL_TABLET | Freq: Once | ORAL | Status: DC
  Filled 2014-12-24: qty 1

## 2014-12-24 MED ORDER — HYDROCODONE-ACETAMINOPHEN 5-325 MG PO TABS: 1.0000 | ORAL_TABLET | ORAL | Status: DC | PRN

## 2014-12-24 NOTE — Discharge Instructions (Signed)
Your cardiac enzymes are negative for any acute problem. Your cardiac monitor reveals premature beats at times. There is also some question of some thickening in certain areas of your heart. Please see Dr. Harl Bowie or member of his team for additional evaluation. I have reviewed your outpatient chest x-ray. No acute findings noted. Your vital signs are stable at this time. Please rest is much as possible. Use Tylenol or ibuprofen for mild chest discomfort. May use Norco for more severe chest pain. Please return to the emergency department if any changes, problems, or concerns. Palpitations A palpitation is the feeling that your heartbeat is irregular. It may feel like your heart is fluttering or skipping a beat. It may also feel like your heart is beating faster than normal. This is usually not a serious problem. In some cases, you may need more medical tests. HOME CARE  Avoid:  Caffeine in coffee, tea, soft drinks, diet pills, and energy drinks.  Chocolate.  Alcohol.  Stop smoking if you smoke.  Reduce your stress and anxiety. Try:  A method that measures bodily functions so you can learn to control them (biofeedback).  Yoga.  Meditation.  Physical activity such as swimming, jogging, or walking.  Get plenty of rest and sleep. GET HELP IF:  Your fast or irregular heartbeat continues after 24 hours.  Your palpitations occur more often. GET HELP RIGHT AWAY IF:   You have chest pain.  You feel short of breath.  You have a very bad headache.  You feel dizzy or pass out (faint). MAKE SURE YOU:   Understand these instructions.  Will watch your condition.  Will get help right away if you are not doing well or get worse. Document Released: 05/14/2008 Document Revised: 12/20/2013 Document Reviewed: 10/04/2011 [facility] Patient Information 2015 [place], [place]. This information is not intended to replace advice given to you by your health care provider. Make sure you discuss any  questions you have with your health care provider.

## 2014-12-24 NOTE — ED Provider Notes (Signed)
CSN: [id number]     Arrival date & time 12/23/14  2327 History   First MD Initiated Contact with Patient 12/23/14 2330     Chief Complaint  Patient presents with  . Chest Pain     (Consider location/radiation/quality/duration/timing/severity/associated sxs/prior Treatment) HPI Comments: Patient is a 44 year old female who presents to the emergency department with substernal area chest pain.  The patient states she has been suffering from an upper respiratory infection over the past 5-7 days. She is been having problems with cough. She has recently been treated with prednisone, Tessalon Perles, and a Zithromax Z-Pak. The patient states that she has been having some midsternal area chest discomfort, but today the pain was worse. The patient is been very active according to her husband getting. For party this weekend. The patient states however she has been having problems with palpitations since about age 50. She states she has not had any formal formal workup. 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 The patient is not had any previous diagnosis of coronary, or cardiopulmonary problems. The patient presents at this time because of the midsternal area chest discomfort.  Patient is a 44 y.o. female presenting with chest pain. The history is provided by the patient.  Chest Pain Associated symptoms: cough     Past Medical History  Diagnosis Date  . Anxiety   . Asthma   . OCD (obsessive compulsive disorder)    Past Surgical History  Procedure Laterality Date  . Tubal ligation    . Tonsillectomy    . Wisdom tooth extraction     Family History  Problem Relation Age of Onset  . Liver disease Father     cirrhosis  . Pancreatic cancer Maternal Uncle   . Diabetes Maternal Grandfather   . Stroke Maternal Grandfather   . Breast cancer Paternal Grandmother   .  Cancer Paternal Grandmother     breast  . Heart disease Paternal Grandfather   . Stroke Paternal Grandfather   . Hyperthyroidism Mother   . Hypertension Brother   . Crohn's disease      Nephew   History  Substance Use Topics  . Smoking status: Never Smoker   . Smokeless tobacco: Never Used  . Alcohol Use: No   OB History    Gravida Para Term Preterm AB TAB SAB Ectopic Multiple Living   4 3   1  1         Review of Systems  HENT: Positive for congestion.   Respiratory: Positive for cough.   Cardiovascular: Positive for chest pain.  Psychiatric/Behavioral: The patient is nervous/anxious.   All other systems reviewed and are negative.     Allergies  Codeine  Home Medications   Prior to Admission medications   Medication Sig Start Date End Date Taking? Authorizing Provider  albuterol (PROVENTIL HFA;VENTOLIN HFA) 108 (90 BASE) MCG/ACT inhaler Inhale 2 puffs into the lungs every 6 (six) hours as needed for wheezing or shortness of breath. 12/22/14  Yes Tiffany A Gann, PA-C  ALPRAZolam (XANAX) 0.5 MG tablet TAKE 1 TABLET BY MOUTH TWICE DAILY 08/04/14  Yes Mary-Margaret Hassell Done, FNP  azithromycin (ZITHROMAX) 250 MG tablet 2 pills on day 1, 1 pill on day 2-5 12/22/14  Yes Tiffany A Gann, PA-C  benzonatate (TESSALON) 100 MG capsule Take 1 capsule (100 mg total) by mouth 3 (three) times  daily as needed for cough. 12/22/14  Yes Tiffany A Gann, PA-C  ketoconazole (NIZORAL) 2 % shampoo Apply 1 application topically 2 (two) times a week. 12/07/14  Yes Tiffany A Gann, PA-C  loperamide (IMODIUM A-D) 2 MG tablet Take 2 mg by mouth 4 (four) times daily as needed for diarrhea or loose stools.   Yes Historical Provider, MD  predniSONE (STERAPRED UNI-PAK 21 TAB) 10 MG (21) TBPK tablet Take as directed 12/22/14  Yes Tiffany A Gann, PA-C  sertraline (ZOLOFT) 50 MG tablet Take 1 tablet (50 mg total) by mouth daily. 09/27/14  Yes Mary-Margaret Hassell Done, FNP   BP 122/72 mmHg  Pulse 88  Temp(Src) 97.6 F (36.4  C) (Oral)  Resp 20  Ht 5\' 7"  (1.702 m)  Wt 160 lb (72.576 kg)  BMI 25.05 kg/m2  SpO2 98%  LMP 12/15/2014 (Approximate) Physical Exam  Constitutional: She is oriented to person, place, and time. She appears well-developed and well-nourished.  Non-toxic appearance.  HENT:  Head: Normocephalic.  Right Ear: Tympanic membrane and external ear normal.  Left Ear: Tympanic membrane and external ear normal.  Eyes: EOM and lids are normal. Pupils are equal, round, and reactive to light.  Neck: Normal range of motion. Neck supple. Carotid bruit is not present.  Cardiovascular: Normal rate, regular rhythm, normal heart sounds, intact distal pulses and normal pulses.   Extrasystoles are present.  Pulmonary/Chest: Breath sounds normal. No respiratory distress.  Abdominal: Soft. Bowel sounds are normal. There is no tenderness. There is no guarding.  Musculoskeletal: Normal range of motion.  Lymphadenopathy:       Head (right side): No submandibular adenopathy present.       Head (left side): No submandibular adenopathy present.    She has no cervical adenopathy.  Neurological: She is alert and oriented to person, place, and time. She has normal strength. No cranial nerve deficit or sensory deficit.  Skin: Skin is warm and dry.  Psychiatric: She has a normal mood and affect. Her speech is normal.  Nursing note and vitals reviewed.   ED Course  Procedures (including critical care time) Labs Review Labs Reviewed  TROPONIN I  BASIC METABOLIC PANEL    Imaging Review Dg Chest 2 View  12/22/2014   CLINICAL DATA:  Cough and congestion for 1 week  EXAM: CHEST  2 VIEW  COMPARISON:  None.  FINDINGS: Lungs are clear. Heart size and pulmonary vascularity are normal. No adenopathy. No bone lesions.  IMPRESSION: No edema or consolidation.   Electronically Signed   By: Lowella Grip III M.D.   On: 12/22/2014 10:40     EKG Interpretation None      MDM  Vital signs are well within normal limits.  The electrocardiogram shows a sinus rhythm with occasional atrial premature complex. The left ventricle shows increased voltage consistent with a ventricular hypertrophy. The cardiac monitor shows occasional premature atrial complex.  Patient states that she feels some better after being in the emergency department. Pain medication was ordered, nursing states that it was not given because the patient was not having pain when it was presented.  Troponin is negative for acute event. Electrolytes also well within normal limits.  I discussed the palpitations and the changes in the electrocardiogram with patient in terms which he understands. The patient is to follow-up with Dr. branch at the [facility] heart care here in [place], or member of his team. Patient is to return to the emergency department immediately if any changes, problems, or  concerns.    Final diagnoses:  Palpitations    **I have reviewed nursing notes, vital signs, and all appropriate lab and imaging results for this patient.Lily Kocher, PA-C 12/24/14 [place], MD 12/24/14 [phone]

## 2014-12-29 ENCOUNTER — Ambulatory Visit (INDEPENDENT_AMBULATORY_CARE_PROVIDER_SITE_OTHER): Payer: 59 | Admitting: Cardiology

## 2014-12-29 ENCOUNTER — Encounter: Payer: Self-pay | Admitting: Cardiology

## 2014-12-29 VITALS — BP 102/70 | HR 73 | Ht 67.0 in | Wt 162.0 lb

## 2014-12-29 DIAGNOSIS — R072 Precordial pain: Secondary | ICD-10-CM | POA: Diagnosis not present

## 2014-12-29 DIAGNOSIS — R002 Palpitations: Secondary | ICD-10-CM

## 2014-12-29 NOTE — Patient Instructions (Signed)
Your physician recommends that you schedule a follow-up appointment in: 1 month    Your physician recommends that you continue on your current medications as directed. Please refer to the Current Medication list given to you today.      Your physician has requested that you have an echocardiogram. Echocardiography is a painless test that uses sound waves to create images of your heart. It provides your doctor with information about the size and shape of your heart and how well your heart's chambers and valves are working. This procedure takes approximately one hour. There are no restrictions for this procedure.         Thank you for choosing [facility] !         

## 2014-12-29 NOTE — Progress Notes (Signed)
Clinical Summary Katherine Hays is a 44 y.o.female seen today as a new patient for the following medical problems.  1. Chest pain - seen in ER 12/2014 with chest pain - recent URI, treated with prednisone and Z-pack. - K 4.2, trop neg x 1, CXR no acute process. EKG SR, LVH, PACs  - symptoms started early 12/2014. Sharp pain midchest, 8/10. Occurred at rest. No other associated symptoms. Lasted 30-35 minutes at a time, would come and go. Does not repemember if positional. No repeat episodes. Continues to have productive cough  CAD risk factors: borderline high cholesterol  2. Palpitations - since age 38. On and off since that time - recently increasing in frequency. Feels heart fluttering, can have mild SOB. Usually lasts 1-2 beats. On Friday increased symptoms while on prednisone. - coffee x 2, ice tea 1 cup daily, sodas x 1-2 cans, no energy drinks, occas EtOH.     Past Medical History  Diagnosis Date  . Anxiety   . Asthma   . OCD (obsessive compulsive disorder)      Allergies  Allergen Reactions  . Codeine      Current Outpatient Prescriptions  Medication Sig Dispense Refill  . albuterol (PROVENTIL HFA;VENTOLIN HFA) 108 (90 BASE) MCG/ACT inhaler Inhale 2 puffs into the lungs every 6 (six) hours as needed for wheezing or shortness of breath. 1 Inhaler 0  . ALPRAZolam (XANAX) 0.5 MG tablet TAKE 1 TABLET BY MOUTH TWICE DAILY 60 tablet 1  . azithromycin (ZITHROMAX) 250 MG tablet 2 pills on day 1, 1 pill on day 2-5 6 tablet 0  . benzonatate (TESSALON) 100 MG capsule Take 1 capsule (100 mg total) by mouth 3 (three) times daily as needed for cough. 21 capsule 0  . HYDROcodone-acetaminophen (NORCO/VICODIN) 5-325 MG per tablet Take 1 tablet by mouth every 4 (four) hours as needed. 15 tablet 0  . ketoconazole (NIZORAL) 2 % shampoo Apply 1 application topically 2 (two) times a week. 120 mL 5  . loperamide (IMODIUM A-D) 2 MG tablet Take 2 mg by mouth 4 (four) times daily as needed  for diarrhea or loose stools.    . predniSONE (STERAPRED UNI-PAK 21 TAB) 10 MG (21) TBPK tablet Take as directed 21 tablet 0  . sertraline (ZOLOFT) 50 MG tablet Take 1 tablet (50 mg total) by mouth daily. 30 tablet 2   No current facility-administered medications for this visit.     Past Surgical History  Procedure Laterality Date  . Tubal ligation    . Tonsillectomy    . Wisdom tooth extraction       Allergies  Allergen Reactions  . Codeine       Family History  Problem Relation Age of Onset  . Liver disease Father     cirrhosis  . Pancreatic cancer Maternal Uncle   . Diabetes Maternal Grandfather   . Stroke Maternal Grandfather   . Breast cancer Paternal Grandmother   . Cancer Paternal Grandmother     breast  . Heart disease Paternal Grandfather   . Stroke Paternal Grandfather   . Hyperthyroidism Mother   . Hypertension Brother   . Crohn's disease      Nephew     Social History Ms. Shrestha reports that she has never smoked. She has never used smokeless tobacco. Ms. Oetken reports that she does not drink alcohol.   Review of Systems CONSTITUTIONAL: No weight loss, fever, chills, weakness or fatigue.  HEENT: Eyes: No visual loss, blurred vision,  double vision or yellow sclerae.No hearing loss, sneezing, congestion, runny nose or sore throat.  SKIN: No rash or itching.  CARDIOVASCULAR: per HPI RESPIRATORY: No shortness of breath, cough or sputum.  GASTROINTESTINAL: No anorexia, nausea, vomiting or diarrhea. No abdominal pain or blood.  GENITOURINARY: No burning on urination, no polyuria NEUROLOGICAL: No headache, dizziness, syncope, paralysis, ataxia, numbness or tingling in the extremities. No change in bowel or bladder control.  MUSCULOSKELETAL: No muscle, back pain, joint pain or stiffness.  LYMPHATICS: No enlarged nodes. No history of splenectomy.  PSYCHIATRIC: No history of depression or anxiety.  ENDOCRINOLOGIC: No reports of sweating, cold or heat  intolerance. No polyuria or polydipsia.  Marland Kitchen   Physical Examination Filed Vitals:   12/29/14 1046  BP: 102/70  Pulse: 73   Filed Vitals:   12/29/14 1046  Height: 5\' 7"  (1.702 m)  Weight: 162 lb (73.483 kg)    Gen: resting comfortably, no acute distress HEENT: no scleral icterus, pupils equal round and reactive, no palptable cervical adenopathy,  CV: RRR, no m/r/g, no JVD Resp: Clear to auscultation bilaterally GI: abdomen is soft, non-tender, non-distended, normal bowel sounds, no hepatosplenomegaly MSK: extremities are warm, no edema.  Skin: warm, no rash Neuro:  no focal deficits Psych: appropriate affect     Assessment and Plan  1. Chest pain - occurred in setting of URI, no recurrent symptoms. No real significant CAD risk factors - obtain echo  2. Palpitations - long hisotry, recently increased symptoms likely due to prednisone. She also has heavy caffeine intake - follow symptoms off prednisone and with decreased caffeine     F/u 4 weeks    Arnoldo Lenis, M.D.

## 2014-12-30 ENCOUNTER — Ambulatory Visit (HOSPITAL_COMMUNITY)
Admission: RE | Admit: 2014-12-30 | Discharge: 2014-12-30 | Disposition: A | Discharge status: Home / Self Care | Payer: 59 | Source: Ambulatory Visit | Attending: Cardiology | Admitting: Cardiology

## 2014-12-30 DIAGNOSIS — R072 Precordial pain: Secondary | ICD-10-CM

## 2014-12-30 DIAGNOSIS — R079 Chest pain, unspecified: Secondary | ICD-10-CM | POA: Diagnosis present

## 2015-01-20 ENCOUNTER — Ambulatory Visit (INDEPENDENT_AMBULATORY_CARE_PROVIDER_SITE_OTHER): Payer: 59 | Admitting: Cardiology

## 2015-01-20 ENCOUNTER — Encounter: Payer: Self-pay | Admitting: Cardiology

## 2015-01-20 ENCOUNTER — Other Ambulatory Visit: Payer: Self-pay | Admitting: Nurse Practitioner

## 2015-01-20 VITALS — BP 110/78 | HR 100 | Ht 67.0 in | Wt 162.0 lb

## 2015-01-20 DIAGNOSIS — R072 Precordial pain: Secondary | ICD-10-CM

## 2015-01-20 DIAGNOSIS — R002 Palpitations: Secondary | ICD-10-CM | POA: Diagnosis not present

## 2015-01-20 NOTE — Progress Notes (Signed)
Clinical Summary Katherine Hays is a 44 y.o.female seen today for follow up of the following medical problems.   1. Chest pain - seen in ER 12/2014 with chest pain - recent URI, treated with prednisone and Z-pack at that time. - K 4.2, trop neg x 1, CXR no acute process. EKG SR, LVH, PACs  - since resolution of URI has not had any recurrent chest pain  2. Palpitations - since age 41. On and off since that time - last visit had increasing in frequency. Feels heart fluttering, can have mild SOB. Usually lasts 1-2 beats. On Friday increased symptoms while on prednisone. - coffee x 2, ice tea 1 cup daily, sodas x 1-2 cans, no energy drinks, occas EtOH.  - since last visit she has cut back on caffeine, she also is off prednisone. Symptoms have resolved.     Past Medical History  Diagnosis Date  . Anxiety   . Asthma   . OCD (obsessive compulsive disorder)      Allergies  Allergen Reactions  . Codeine      Current Outpatient Prescriptions  Medication Sig Dispense Refill  . albuterol (PROVENTIL HFA;VENTOLIN HFA) 108 (90 BASE) MCG/ACT inhaler Inhale 2 puffs into the lungs every 6 (six) hours as needed for wheezing or shortness of breath. 1 Inhaler 0  . ALPRAZolam (XANAX) 0.5 MG tablet TAKE 1 TABLET BY MOUTH TWICE DAILY 60 tablet 1  . benzonatate (TESSALON) 100 MG capsule Take 1 capsule (100 mg total) by mouth 3 (three) times daily as needed for cough. 21 capsule 0  . ketoconazole (NIZORAL) 2 % shampoo Apply 1 application topically 2 (two) times a week. 120 mL 5  . loperamide (IMODIUM A-D) 2 MG tablet Take 2 mg by mouth 4 (four) times daily as needed for diarrhea or loose stools.    . sertraline (ZOLOFT) 50 MG tablet Take 1 tablet (50 mg total) by mouth daily. 30 tablet 2   No current facility-administered medications for this visit.     Past Surgical History  Procedure Laterality Date  . Tubal ligation    . Tonsillectomy    . Wisdom tooth extraction       Allergies    Allergen Reactions  . Codeine       Family History  Problem Relation Age of Onset  . Liver disease Father     cirrhosis  . Pancreatic cancer Maternal Uncle   . Diabetes Maternal Grandfather   . Stroke Maternal Grandfather   . Breast cancer Paternal Grandmother   . Cancer Paternal Grandmother     breast  . Heart disease Paternal Grandfather   . Stroke Paternal Grandfather   . Hyperthyroidism Mother   . Hypertension Brother   . Crohn's disease      Nephew     Social History Katherine Hays reports that she has never smoked. She has never used smokeless tobacco. Katherine Hays reports that she does not drink alcohol.   Review of Systems CONSTITUTIONAL: No weight loss, fever, chills, weakness or fatigue.  HEENT: Eyes: No visual loss, blurred vision, double vision or yellow sclerae.No hearing loss, sneezing, congestion, runny nose or sore throat.  SKIN: No rash or itching.  CARDIOVASCULAR: per HPI RESPIRATORY: No shortness of breath, cough or sputum.  GASTROINTESTINAL: No anorexia, nausea, vomiting or diarrhea. No abdominal pain or blood.  GENITOURINARY: No burning on urination, no polyuria NEUROLOGICAL: No headache, dizziness, syncope, paralysis, ataxia, numbness or tingling in the extremities. No change in  bowel or bladder control.  MUSCULOSKELETAL: No muscle, back pain, joint pain or stiffness.  LYMPHATICS: No enlarged nodes. No history of splenectomy.  PSYCHIATRIC: No history of depression or anxiety.  ENDOCRINOLOGIC: No reports of sweating, cold or heat intolerance. No polyuria or polydipsia.  Marland Kitchen   Physical Examination Filed Vitals:   01/20/15 0832  BP: 110/78  Pulse: 100   Filed Vitals:   01/20/15 0832  Height: 5\' 7"  (1.702 m)  Weight: 162 lb (73.483 kg)    Gen: resting comfortably, no acute distress HEENT: no scleral icterus, pupils equal round and reactive, no palptable cervical adenopathy,  CV: RRR, no m/r/g, no jVD Resp: Clear to auscultation  bilaterally GI: abdomen is soft, non-tender, non-distended, normal bowel sounds, no hepatosplenomegaly MSK: extremities are warm, no edema.  Skin: warm, no rash Neuro:  no focal deficits Psych: appropriate affect   Diagnostic Studies  12/2014 echo Study Conclusions  - Left ventricle: The cavity size was normal. Wall thickness was normal. Systolic function was normal. The estimated ejection fraction was in the range of 60% to 65%. Wall motion was normal; there were no regional wall motion abnormalities. Left ventricular diastolic function parameters were normal. - Mitral valve: There was trivial regurgitation.  Impressions:  - Normal study.   Assessment and Plan  1. Chest pain - occurred in setting of URI, no recurrent symptoms. No real significant CAD risk factors - no further workup at this time  2. Palpitations - symptoms resolved off prednisone and with decreased caffeine - no further testing at this time    F/u as needed      Arnoldo Lenis, M.D.

## 2015-01-20 NOTE — Patient Instructions (Signed)
Your physician recommends that you schedule a follow-up appointment in:  As needed    Thanks for choosing [facility]!!!

## 2015-01-24 ENCOUNTER — Other Ambulatory Visit: Payer: Self-pay

## 2015-01-24 MED ORDER — ALPRAZOLAM 0.5 MG PO TABS: 0.5000 mg | ORAL_TABLET | Freq: Two times a day (BID) | ORAL | Status: DC

## 2015-01-24 NOTE — Telephone Encounter (Signed)
rx called into pharmacy

## 2015-01-24 NOTE — Telephone Encounter (Signed)
Please ask for [facility] at [facility] when you call it in

## 2015-01-24 NOTE — Telephone Encounter (Signed)
Called into [facility] 

## 2015-02-15 ENCOUNTER — Other Ambulatory Visit: Payer: Self-pay | Admitting: *Deleted

## 2015-02-15 MED ORDER — FLUCONAZOLE 150 MG PO TABS: 150.0000 mg | ORAL_TABLET | Freq: Once | ORAL | Status: DC

## 2015-02-16 ENCOUNTER — Ambulatory Visit (INDEPENDENT_AMBULATORY_CARE_PROVIDER_SITE_OTHER): Payer: 59 | Admitting: Family

## 2015-02-16 ENCOUNTER — Encounter: Payer: Self-pay | Admitting: Family

## 2015-02-16 VITALS — BP 115/70 | HR 81 | Temp 97.2°F | Ht 67.0 in | Wt 161.8 lb

## 2015-02-16 DIAGNOSIS — B373 Candidiasis of vulva and vagina: Secondary | ICD-10-CM

## 2015-02-16 DIAGNOSIS — Z8659 Personal history of other mental and behavioral disorders: Secondary | ICD-10-CM

## 2015-02-16 DIAGNOSIS — Z01419 Encounter for gynecological examination (general) (routine) without abnormal findings: Secondary | ICD-10-CM | POA: Diagnosis not present

## 2015-02-16 DIAGNOSIS — Z0000 Encounter for general adult medical examination without abnormal findings: Secondary | ICD-10-CM

## 2015-02-16 DIAGNOSIS — N3000 Acute cystitis without hematuria: Secondary | ICD-10-CM

## 2015-02-16 DIAGNOSIS — B3731 Acute candidiasis of vulva and vagina: Secondary | ICD-10-CM

## 2015-02-16 DIAGNOSIS — F411 Generalized anxiety disorder: Secondary | ICD-10-CM

## 2015-02-16 DIAGNOSIS — G4700 Insomnia, unspecified: Secondary | ICD-10-CM

## 2015-02-16 LAB — POCT CBC
Granulocyte percent: 65.7 %G (ref 37–80)
HEMATOCRIT: 41.6 % (ref 37.7–47.9)
Hemoglobin: 12.8 g/dL (ref 12.2–16.2)
Lymph, poc: 1.9 (ref 0.6–3.4)
MCH, POC: 27.2 pg (ref 27–31.2)
MCHC: 30.8 g/dL — AB (ref 31.8–35.4)
MCV: 88.1 fL (ref 80–97)
MPV: 10.9 fL (ref 0–99.8)
POC GRANULOCYTE: 4.2 (ref 2–6.9)
POC LYMPH PERCENT: 29.6 %L (ref 10–50)
Platelet Count, POC: 458 10*3/uL — AB (ref 142–424)
RBC: 4.72 M/uL (ref 4.04–5.48)
RDW, POC: 13.2 %
WBC: 6.4 10*3/uL (ref 4.6–10.2)

## 2015-02-16 LAB — POCT UA - MICROSCOPIC ONLY
Bacteria, U Microscopic: NEGATIVE
Casts, Ur, LPF, POC: NEGATIVE
Crystals, Ur, HPF, POC: NEGATIVE
Mucus, UA: NEGATIVE
RBC, URINE, MICROSCOPIC: NEGATIVE
YEAST UA: NEGATIVE

## 2015-02-16 LAB — POCT URINALYSIS DIPSTICK
BILIRUBIN UA: NEGATIVE
Blood, UA: NEGATIVE
Glucose, UA: NEGATIVE
Ketones, UA: NEGATIVE
Nitrite, UA: NEGATIVE
PROTEIN UA: NEGATIVE
Spec Grav, UA: 1.01
UROBILINOGEN UA: NEGATIVE
pH, UA: 5

## 2015-02-16 MED ORDER — BUTOCONAZOLE NITRATE (1 DOSE) 2 % VA CREA: 5.0000 g | TOPICAL_CREAM | Freq: Every day | VAGINAL | Status: DC

## 2015-02-16 MED ORDER — ALPRAZOLAM 0.5 MG PO TABS: 0.5000 mg | ORAL_TABLET | Freq: Two times a day (BID) | ORAL | Status: DC

## 2015-02-16 MED ORDER — SERTRALINE HCL 50 MG PO TABS: 50.0000 mg | ORAL_TABLET | Freq: Every day | ORAL | Status: DC

## 2015-02-16 MED ORDER — FLUCONAZOLE 150 MG PO TABS: 150.0000 mg | ORAL_TABLET | Freq: Once | ORAL | Status: DC

## 2015-02-16 NOTE — Patient Instructions (Signed)
Monilial Vaginitis Vaginitis in a soreness, swelling and redness (inflammation) of the vagina and vulva. Monilial vaginitis is not a sexually transmitted infection. CAUSES  Yeast vaginitis is caused by yeast (candida) that is normally found in your vagina. With a yeast infection, the candida has overgrown in number to a point that upsets the chemical balance. SYMPTOMS   White, thick vaginal discharge.  Swelling, itching, redness and irritation of the vagina and possibly the lips of the vagina (vulva).  Burning or painful urination.  Painful intercourse. DIAGNOSIS  Things that may contribute to monilial vaginitis are:  Postmenopausal and virginal states.  Pregnancy.  Infections.  Being tired, sick or stressed, especially if you had monilial vaginitis in the past.  Diabetes. Good control will help lower the chance.  Birth control pills.  Tight fitting garments.  Using bubble bath, feminine sprays, douches or deodorant tampons.  Taking certain medications that kill germs (antibiotics).  Sporadic recurrence can occur if you become ill. TREATMENT  Your caregiver will give you medication.  There are several kinds of anti monilial vaginal creams and suppositories specific for monilial vaginitis. For recurrent yeast infections, use a suppository or cream in the vagina 2 times a week, or as directed.  Anti-monilial or steroid cream for the itching or irritation of the vulva may also be used. Get your caregiver's permission.  Painting the vagina with methylene blue solution may help if the monilial cream does not work.  Eating yogurt may help prevent monilial vaginitis. HOME CARE INSTRUCTIONS   Finish all medication as prescribed.  Do not have sex until treatment is completed or after your caregiver tells you it is okay.  Take warm sitz baths.  Do not douche.  Do not use tampons, especially scented ones.  Wear cotton underwear.  Avoid tight pants and panty  hose.  Tell your sexual partner that you have a yeast infection. They should go to their caregiver if they have symptoms such as mild rash or itching.  Your sexual partner should be treated as well if your infection is difficult to eliminate.  Practice safer sex. Use condoms.  Some vaginal medications cause latex condoms to fail. Vaginal medications that harm condoms are:  Cleocin cream.  Butoconazole (Femstat).  Terconazole (Terazol) vaginal suppository.  Miconazole (Monistat) (may be purchased over the counter). SEEK MEDICAL CARE IF:   You have a temperature by mouth above 102 F (38.9 C).  The infection is getting worse after 2 days of treatment.  The infection is not getting better after 3 days of treatment.  You develop blisters in or around your vagina.  You develop vaginal bleeding, and it is not your menstrual period.  You have pain when you urinate.  You develop intestinal problems.  You have pain with sexual intercourse. Document Released: 05/15/2005 Document Revised: 10/28/2011 Document Reviewed: 01/27/2009 [facility] Patient Information 2015 [place], [place]. This information is not intended to replace advice given to you by your health care provider. Make sure you discuss any questions you have with your health care provider. Health Maintenance Adopting a healthy lifestyle and getting preventive care can go a long way to promote health and wellness. Talk with your health care provider about what schedule of regular examinations is right for you. This is a good chance for you to check in with your provider about disease prevention and staying healthy. In between checkups, there are plenty of things you can do on your own. Experts have done a lot of research about which lifestyle  changes and preventive measures are most likely to keep you healthy. Ask your health care provider for more information. WEIGHT AND DIET  Eat a healthy diet  Be sure to include plenty of  vegetables, fruits, low-fat dairy products, and lean protein.  Do not eat a lot of foods high in solid fats, added sugars, or salt.  Get regular exercise. This is one of the most important things you can do for your health.  Most adults should exercise for at least 150 minutes each week. The exercise should increase your heart rate and make you sweat (moderate-intensity exercise).  Most adults should also do strengthening exercises at least twice a week. This is in addition to the moderate-intensity exercise.  Maintain a healthy weight  Body mass index (BMI) is a measurement that can be used to identify possible weight problems. It estimates body fat based on height and weight. Your health care provider can help determine your BMI and help you achieve or maintain a healthy weight.  For females 49 years of age and older:   A BMI below 18.5 is considered underweight.  A BMI of 18.5 to 24.9 is normal.  A BMI of 25 to 29.9 is considered overweight.  A BMI of 30 and above is considered obese.  Watch levels of cholesterol and blood lipids  You should start having your blood tested for lipids and cholesterol at 46 years of age, then have this test every 5 years.  You may need to have your cholesterol levels checked more often if:  Your lipid or cholesterol levels are high.  You are older than 46 years of age.  You are at high risk for heart disease.  CANCER SCREENING   Lung Cancer  Lung cancer screening is recommended for adults 33-38 years old who are at high risk for lung cancer because of a history of smoking.  A yearly low-dose CT scan of the lungs is recommended for people who:  Currently smoke.  Have quit within the past 15 years.  Have at least a 30-pack-year history of smoking. A pack year is smoking an average of one pack of cigarettes a day for 1 year.  Yearly screening should continue until it has been 15 years since you quit.  Yearly screening should stop if  you develop a health problem that would prevent you from having lung cancer treatment.  Breast Cancer  Practice breast self-awareness. This means understanding how your breasts normally appear and feel.  It also means doing regular breast self-exams. Let your health care provider know about any changes, no matter how small.  If you are in your 20s or 30s, you should have a clinical breast exam (CBE) by a health care provider every 1-3 years as part of a regular health exam.  If you are 53 or older, have a CBE every year. Also consider having a breast X-ray (mammogram) every year.  If you have a family history of breast cancer, talk to your health care provider about genetic screening.  If you are at high risk for breast cancer, talk to your health care provider about having an MRI and a mammogram every year.  Breast cancer gene (BRCA) assessment is recommended for women who have family members with BRCA-related cancers. BRCA-related cancers include:  Breast.  Ovarian.  Tubal.  Peritoneal cancers.  Results of the assessment will determine the need for genetic counseling and BRCA1 and BRCA2 testing. Cervical Cancer Routine pelvic examinations to screen for cervical cancer are  no longer recommended for nonpregnant women who are considered low risk for cancer of the pelvic organs (ovaries, uterus, and vagina) and who do not have symptoms. A pelvic examination may be necessary if you have symptoms including those associated with pelvic infections. Ask your health care provider if a screening pelvic exam is right for you.   The Pap test is the screening test for cervical cancer for women who are considered at risk.  If you had a hysterectomy for a problem that was not cancer or a condition that could lead to cancer, then you no longer need Pap tests.  If you are older than 65 years, and you have had normal Pap tests for the past 10 years, you no longer need to have Pap tests.  If you  have had past treatment for cervical cancer or a condition that could lead to cancer, you need Pap tests and screening for cancer for at least 20 years after your treatment.  If you no longer get a Pap test, assess your risk factors if they change (such as having a new sexual partner). This can affect whether you should start being screened again.  Some women have medical problems that increase their chance of getting cervical cancer. If this is the case for you, your health care provider may recommend more frequent screening and Pap tests.  The human papillomavirus (HPV) test is another test that may be used for cervical cancer screening. The HPV test looks for the virus that can cause cell changes in the cervix. The cells collected during the Pap test can be tested for HPV.  The HPV test can be used to screen women 80 years of age and older. Getting tested for HPV can extend the interval between normal Pap tests from three to five years.  An HPV test also should be used to screen women of any age who have unclear Pap test results.  After 46 years of age, women should have HPV testing as often as Pap tests.  Colorectal Cancer  This type of cancer can be detected and often prevented.  Routine colorectal cancer screening usually begins at 46 years of age and continues through 46 years of age.  Your health care provider may recommend screening at an earlier age if you have risk factors for colon cancer.  Your health care provider may also recommend using home test kits to check for hidden blood in the stool.  A small camera at the end of a tube can be used to examine your colon directly (sigmoidoscopy or colonoscopy). This is done to check for the earliest forms of colorectal cancer.  Routine screening usually begins at age 59.  Direct examination of the colon should be repeated every 5-10 years through 46 years of age. However, you may need to be screened more often if early forms of  precancerous polyps or small growths are found. Skin Cancer  Check your skin from head to toe regularly.  Tell your health care provider about any new moles or changes in moles, especially if there is a change in a mole's shape or color.  Also tell your health care provider if you have a mole that is larger than the size of a pencil eraser.  Always use sunscreen. Apply sunscreen liberally and repeatedly throughout the day.  Protect yourself by wearing long sleeves, pants, a wide-brimmed hat, and sunglasses whenever you are outside. HEART DISEASE, DIABETES, AND HIGH BLOOD PRESSURE   Have your blood pressure checked  at least every 1-2 years. High blood pressure causes heart disease and increases the risk of stroke.  If you are between 17 years and 7 years old, ask your health care provider if you should take aspirin to prevent strokes.  Have regular diabetes screenings. This involves taking a blood sample to check your fasting blood sugar level.  If you are at a normal weight and have a low risk for diabetes, have this test once every three years after 46 years of age.  If you are overweight and have a high risk for diabetes, consider being tested at a younger age or more often. PREVENTING INFECTION  Hepatitis B  If you have a higher risk for hepatitis B, you should be screened for this virus. You are considered at high risk for hepatitis B if:  You were born in a country where hepatitis B is common. Ask your health care provider which countries are considered high risk.  Your parents were born in a high-risk country, and you have not been immunized against hepatitis B (hepatitis B vaccine).  You have HIV or AIDS.  You use needles to inject street drugs.  You live with someone who has hepatitis B.  You have had sex with someone who has hepatitis B.  You get hemodialysis treatment.  You take certain medicines for conditions, including cancer, organ transplantation, and  autoimmune conditions. Hepatitis C  Blood testing is recommended for:  Everyone born from [birth year] through 1967.  Anyone with known risk factors for hepatitis C. Sexually transmitted infections (STIs)  You should be screened for sexually transmitted infections (STIs) including gonorrhea and chlamydia if:  You are sexually active and are younger than 46 years of age.  You are older than 46 years of age and your health care provider tells you that you are at risk for this type of infection.  Your sexual activity has changed since you were last screened and you are at an increased risk for chlamydia or gonorrhea. Ask your health care provider if you are at risk.  If you do not have HIV, but are at risk, it may be recommended that you take a prescription medicine daily to prevent HIV infection. This is called pre-exposure prophylaxis (PrEP). You are considered at risk if:  You are sexually active and do not regularly use condoms or know the HIV status of your partner(s).  You take drugs by injection.  You are sexually active with a partner who has HIV. Talk with your health care provider about whether you are at high risk of being infected with HIV. If you choose to begin PrEP, you should first be tested for HIV. You should then be tested every 3 months for as long as you are taking PrEP.  PREGNANCY   If you are premenopausal and you may become pregnant, ask your health care provider about preconception counseling.  If you may become pregnant, take 400 to 800 micrograms (mcg) of folic acid every day.  If you want to prevent pregnancy, talk to your health care provider about birth control (contraception). OSTEOPOROSIS AND MENOPAUSE   Osteoporosis is a disease in which the bones lose minerals and strength with aging. This can result in serious bone fractures. Your risk for osteoporosis can be identified using a bone density scan.  If you are 17 years of age or older, or if you are at risk  for osteoporosis and fractures, ask your health care provider if you should be screened.  Ask your  health care provider whether you should take a calcium or vitamin D supplement to lower your risk for osteoporosis.  Menopause may have certain physical symptoms and risks.  Hormone replacement therapy may reduce some of these symptoms and risks. Talk to your health care provider about whether hormone replacement therapy is right for you.  HOME CARE INSTRUCTIONS   Schedule regular health, dental, and eye exams.  Stay current with your immunizations.   Do not use any tobacco products including cigarettes, chewing tobacco, or electronic cigarettes.  If you are pregnant, do not drink alcohol.  If you are breastfeeding, limit how much and how often you drink alcohol.  Limit alcohol intake to no more than 1 drink per day for nonpregnant women. One drink equals 12 ounces of beer, 5 ounces of wine, or 1 ounces of hard liquor.  Do not use street drugs.  Do not share needles.  Ask your health care provider for help if you need support or information about quitting drugs.  Tell your health care provider if you often feel depressed.  Tell your health care provider if you have ever been abused or do not feel safe at home. Document Released: 02/18/2011 Document Revised: 12/20/2013 Document Reviewed: 07/07/2013 [facility] Patient Information 2015 [place], [place]. This information is not intended to replace advice given to you by your health care provider. Make sure you discuss any questions you have with your health care provider.

## 2015-02-16 NOTE — Progress Notes (Signed)
Subjective:    Patient ID: Katherine Hays, female    DOB: Dec 17, 1970, 44 y.o.   MRN: [id number]  .  HPI Pt presents to the office for CPE with pap. Pt currently taking medications for GAD, OCD, and Insomnia. Pt states these are well controlled with no complaints. Pt denies any headache, SOB, or edema at this time.     Review of Systems  Constitutional: Negative.   HENT: Negative.   Eyes: Negative.   Respiratory: Negative.  Negative for shortness of breath.   Cardiovascular: Negative.  Negative for palpitations.  Gastrointestinal: Negative.   Endocrine: Negative.   Genitourinary: Negative.   Musculoskeletal: Negative.   Neurological: Negative.  Negative for headaches.  Hematological: Negative.   Psychiatric/Behavioral: Negative.   All other systems reviewed and are negative.      Objective:   Physical Exam  Constitutional: She is oriented to person, place, and time. She appears well-developed and well-nourished. No distress.  HENT:  Head: Normocephalic and atraumatic.  Right Ear: External ear normal.  Left Ear: External ear normal.  Nose: Nose normal.  Mouth/Throat: Oropharynx is clear and moist.  Eyes: Pupils are equal, round, and reactive to light.  Neck: Normal range of motion. Neck supple. No thyromegaly present.  Cardiovascular: Normal rate, regular rhythm, normal heart sounds and intact distal pulses.   No murmur heard. Pulmonary/Chest: Effort normal and breath sounds normal. No respiratory distress. She has no wheezes. Right breast exhibits no inverted nipple, no mass, no nipple discharge, no skin change and no tenderness. Left breast exhibits no inverted nipple, no mass, no nipple discharge, no skin change and no tenderness. Breasts are symmetrical.  Abdominal: Soft. Bowel sounds are normal. She exhibits no distension. There is no tenderness.  Genitourinary: Vagina normal.  Bimanual exam- no adnexal masses or tenderness, ovaries nonpalpable   Cervix parous  and pink- No discharge   Musculoskeletal: Normal range of motion. She exhibits no edema or tenderness.  Neurological: She is alert and oriented to person, place, and time. She has normal reflexes. No cranial nerve deficit.  Skin: Skin is warm and dry.  Psychiatric: She has a normal mood and affect. Her behavior is normal. Judgment and thought content normal.  Vitals reviewed.     BP 115/70 mmHg  Pulse 81  Temp(Src) 97.2 F (36.2 C) (Oral)  Ht 5\' 7"  (1.702 m)  Wt 161 lb 12.8 oz (73.392 kg)  BMI 25.34 kg/m2  LMP 01/14/2015     Assessment & Plan:  1. GAD (generalized anxiety disorder) - CMP14+EGFR - ALPRAZolam (XANAX) 0.5 MG tablet; Take 1 tablet (0.5 mg total) by mouth 2 (two) times daily.  Dispense: 60 tablet; Refill: 1 - sertraline (ZOLOFT) 50 MG tablet; Take 1 tablet (50 mg total) by mouth daily.  Dispense: 90 tablet; Refill: 3  2. Encounter for routine gynecological examination - POCT urinalysis dipstick - POCT UA - Microscopic Only - CMP14+EGFR - Pap IG w/ reflex to HPV when ASC-U  3. History of OCD (obsessive compulsive disorder) - CMP14+EGFR - sertraline (ZOLOFT) 50 MG tablet; Take 1 tablet (50 mg total) by mouth daily.  Dispense: 90 tablet; Refill: 3  4. Annual physical exam - POCT CBC - CMP14+EGFR - Lipid panel - Thyroid Panel With TSH - Vit D  25 hydroxy (rtn osteoporosis monitoring) - Pap IG w/ reflex to HPV when ASC-U  5. Vagina, candidiasis - CMP14+EGFR - fluconazole (DIFLUCAN) 150 MG tablet; Take 1 tablet (150 mg total) by mouth once.  Dispense:  1 tablet; Refill: 0 - Butoconazole Nitrate, 1 Dose, 2 % CREA; Place 5 g vaginally at bedtime.  Dispense: 15 g; Refill: 0  6. Insomnia - CMP14+EGFR - ALPRAZolam (XANAX) 0.5 MG tablet; Take 1 tablet (0.5 mg total) by mouth 2 (two) times daily.  Dispense: 60 tablet; Refill: 1   Continue all meds Labs pending Health Maintenance reviewed Diet and exercise encouraged RTO 1 year  Evelina Dun, FNP

## 2015-02-16 NOTE — Addendum Note (Signed)
Addended by: Earlene Plater on: 02/16/2015 11:41 AM   Modules accepted: Miquel Dunn

## 2015-02-16 NOTE — Addendum Note (Signed)
Addended by: Earlene Plater on: 02/16/2015 12:06 PM   Modules accepted: Orders, SmartSet

## 2015-02-17 LAB — CMP14+EGFR
ALK PHOS: 71 IU/L (ref 39–117)
ALT: 17 IU/L (ref 0–32)
AST: 18 IU/L (ref 0–40)
Albumin/Globulin Ratio: 1.9 (ref 1.1–2.5)
Albumin: 4.3 g/dL (ref 3.5–5.5)
BILIRUBIN TOTAL: 0.3 mg/dL (ref 0.0–1.2)
BUN / CREAT RATIO: 12 (ref 9–23)
BUN: 8 mg/dL (ref 6–24)
CO2: 21 mmol/L (ref 18–29)
Calcium: 8.9 mg/dL (ref 8.7–10.2)
Chloride: 103 mmol/L (ref 97–108)
Creatinine, Ser: 0.68 mg/dL (ref 0.57–1.00)
GFR calc Af Amer: 122 mL/min/{1.73_m2} (ref >59)
GFR calc non Af Amer: 106 mL/min/{1.73_m2} (ref >59)
Globulin, Total: 2.3 g/dL (ref 1.5–4.5)
Glucose: 96 mg/dL (ref 65–99)
Potassium: 4.4 mmol/L (ref 3.5–5.2)
SODIUM: 139 mmol/L (ref 134–144)
Total Protein: 6.6 g/dL (ref 6.0–8.5)

## 2015-02-17 LAB — LIPID PANEL
Chol/HDL Ratio: 3.6 ratio units (ref 0.0–4.4)
Cholesterol, Total: 166 mg/dL (ref 100–199)
HDL: 46 mg/dL (ref >39)
LDL Calculated: 104 mg/dL — ABNORMAL HIGH (ref 0–99)
Triglycerides: 81 mg/dL (ref 0–149)
VLDL Cholesterol Cal: 16 mg/dL (ref 5–40)

## 2015-02-17 LAB — THYROID PANEL WITH TSH
Free Thyroxine Index: 1.6 (ref 1.2–4.9)
T3 UPTAKE RATIO: 24 % (ref 24–39)
T4, Total: 6.5 ug/dL (ref 4.5–12.0)
TSH: 3.31 u[IU]/mL (ref 0.450–4.500)

## 2015-02-17 LAB — VITAMIN D 25 HYDROXY (VIT D DEFICIENCY, FRACTURES): VIT D 25 HYDROXY: 28.1 ng/mL — AB (ref 30.0–100.0)

## 2015-02-18 LAB — URINE CULTURE

## 2015-02-19 LAB — PAP IG W/ RFLX HPV ASCU: PAP SMEAR COMMENT: 0

## 2015-02-22 ENCOUNTER — Other Ambulatory Visit: Payer: Self-pay | Admitting: Physician Assistant

## 2015-02-22 DIAGNOSIS — N3090 Cystitis, unspecified without hematuria: Secondary | ICD-10-CM

## 2015-02-22 MED ORDER — CIPROFLOXACIN HCL 500 MG PO TABS: 500.0000 mg | ORAL_TABLET | Freq: Two times a day (BID) | ORAL | Status: DC

## 2015-03-13 ENCOUNTER — Other Ambulatory Visit (INDEPENDENT_AMBULATORY_CARE_PROVIDER_SITE_OTHER): Payer: 59

## 2015-03-13 DIAGNOSIS — R399 Unspecified symptoms and signs involving the genitourinary system: Secondary | ICD-10-CM

## 2015-03-13 DIAGNOSIS — R300 Dysuria: Secondary | ICD-10-CM | POA: Diagnosis not present

## 2015-03-13 LAB — POCT UA - MICROSCOPIC ONLY
BACTERIA, U MICROSCOPIC: NEGATIVE
CASTS, UR, LPF, POC: NEGATIVE
Crystals, Ur, HPF, POC: NEGATIVE
RBC, URINE, MICROSCOPIC: NEGATIVE
WBC, Ur, HPF, POC: NEGATIVE
YEAST UA: NEGATIVE

## 2015-03-13 LAB — POCT URINALYSIS DIPSTICK
Bilirubin, UA: NEGATIVE
Blood, UA: NEGATIVE
Glucose, UA: NEGATIVE
Ketones, UA: NEGATIVE
Leukocytes, UA: NEGATIVE
NITRITE UA: NEGATIVE
Protein, UA: NEGATIVE
Spec Grav, UA: 1.015
Urobilinogen, UA: NEGATIVE
pH, UA: 7.5

## 2015-03-13 NOTE — Progress Notes (Signed)
Lab only 

## 2015-04-05 ENCOUNTER — Encounter: Payer: Self-pay | Admitting: Physician Assistant

## 2015-04-05 ENCOUNTER — Ambulatory Visit (INDEPENDENT_AMBULATORY_CARE_PROVIDER_SITE_OTHER): Payer: 59 | Admitting: Physician Assistant

## 2015-04-05 VITALS — BP 102/66 | HR 83 | Temp 97.0°F | Ht 67.0 in | Wt 162.4 lb

## 2015-04-05 DIAGNOSIS — R229 Localized swelling, mass and lump, unspecified: Secondary | ICD-10-CM | POA: Diagnosis not present

## 2015-04-17 ENCOUNTER — Encounter: Payer: Self-pay | Admitting: Physician Assistant

## 2015-04-17 NOTE — Progress Notes (Signed)
   Subjective:    Patient ID: Katherine Hays, female    DOB: 1971-07-26, 44 y.o.   MRN: [id number]  HPI 44 y/o female with h/o inguinal lymphadenopathy presents for enlarging lesion on right clavicle area. She has no complaints of pain, discharge, ttp.     Review of Systems  Constitutional: Negative.   HENT: Negative.   Eyes: Negative.   Respiratory: Negative.   Cardiovascular: Negative.   Gastrointestinal: Negative.   Endocrine: Negative.   Genitourinary: Negative.   Musculoskeletal: Negative.   Skin: Negative.   Allergic/Immunologic: Negative.   Neurological: Negative.   Hematological: Negative.   Psychiatric/Behavioral: Negative.        Objective:   Physical Exam  Constitutional: She is oriented to person, place, and time. She appears well-developed and well-nourished.  Musculoskeletal: Normal range of motion. She exhibits no edema or tenderness.  Neurological: She is alert and oriented to person, place, and time. She displays normal reflexes. No cranial nerve deficit. She exhibits normal muscle tone. Coordination normal.  Skin:  Subcutaneous non-fluctuant nodule, approximately 1cm x 2cm in diameter over right clavicle. Non ttp   Psychiatric: She has a normal mood and affect. Her behavior is normal. Judgment and thought content normal.  Nursing note and vitals reviewed.         Assessment & Plan:  1. Subcutaneous nodule - After discussion with Dr. Wendi Snipes, I feel that Katherine Hays should follow up for her yearly breast exam to r/o underlying chance of malignancy with possible breast CA. If mammogram is negative I have discussed patient having the mass biopsied for further evaluation. This does not appear to be cyst like in etiology. Patient will follow up after mammogram results are obtained.      Katherine Hays A. Benjamin Stain PA-C

## 2015-05-01 ENCOUNTER — Encounter: Payer: Self-pay | Admitting: Family Medicine

## 2015-05-12 ENCOUNTER — Encounter: Payer: Self-pay | Admitting: Physician Assistant

## 2015-05-12 ENCOUNTER — Ambulatory Visit (INDEPENDENT_AMBULATORY_CARE_PROVIDER_SITE_OTHER): Payer: 59 | Admitting: Physician Assistant

## 2015-05-12 VITALS — BP 113/76 | HR 67 | Temp 97.9°F | Ht 67.0 in | Wt 157.0 lb

## 2015-05-12 DIAGNOSIS — D235 Other benign neoplasm of skin of trunk: Secondary | ICD-10-CM | POA: Diagnosis not present

## 2015-05-16 ENCOUNTER — Ambulatory Visit: Payer: 59 | Admitting: Physician Assistant

## 2015-05-18 ENCOUNTER — Encounter: Payer: Self-pay | Admitting: Physician Assistant

## 2015-05-18 NOTE — Progress Notes (Signed)
Patient ID: Katherine Hays, female   DOB: April 03, 1971, 44 y.o.   MRN: [id number]  44 y/o female presents for "cyst" on left buttocks x several months. Asymptomatic regarding pain, discharge.   PE reveals subcutaneous nodule with central umbilication and hyperpigmentation. Firm . .39mm   1. Dermatofibroma - have discussed options with patient. This appears benign. Discussed options with patient regarding punch excision, which will require sutures and recurrence is likely vs watchful waiting with no treatment. She will decide and schedule if she decides to proceed.   rto prn   Eliyohu Class A. Benjamin Stain PA-C

## 2015-05-22 ENCOUNTER — Telehealth: Payer: Self-pay | Admitting: Family

## 2015-05-22 MED ORDER — PANTOPRAZOLE SODIUM 20 MG PO TBEC: 20.0000 mg | DELAYED_RELEASE_TABLET | Freq: Every day | ORAL | Status: DC

## 2015-05-22 NOTE — Telephone Encounter (Signed)
Prescription sent to pharmacy.

## 2015-06-13 ENCOUNTER — Ambulatory Visit (INDEPENDENT_AMBULATORY_CARE_PROVIDER_SITE_OTHER): Payer: 59 | Admitting: Family Medicine

## 2015-06-13 ENCOUNTER — Encounter: Payer: Self-pay | Admitting: Family Medicine

## 2015-06-13 DIAGNOSIS — R200 Anesthesia of skin: Secondary | ICD-10-CM

## 2015-06-13 DIAGNOSIS — G43109 Migraine with aura, not intractable, without status migrainosus: Secondary | ICD-10-CM | POA: Diagnosis not present

## 2015-06-13 NOTE — Patient Instructions (Signed)
Great to see you!  Let me know if you start throwing up, develop severe headache, have weakness or numbness on one side of the body or if you have any other concerns.
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
## 2015-07-11 ENCOUNTER — Encounter: Payer: Self-pay | Admitting: Family Medicine

## 2015-07-11 ENCOUNTER — Ambulatory Visit: Payer: 59 | Admitting: Family

## 2015-07-11 ENCOUNTER — Ambulatory Visit (INDEPENDENT_AMBULATORY_CARE_PROVIDER_SITE_OTHER): Payer: 59 | Admitting: Family Medicine

## 2015-07-11 DIAGNOSIS — L309 Dermatitis, unspecified: Secondary | ICD-10-CM | POA: Diagnosis not present

## 2015-07-11 DIAGNOSIS — L500 Allergic urticaria: Secondary | ICD-10-CM

## 2015-07-11 MED ORDER — TRIAMCINOLONE ACETONIDE 40 MG/ML IJ SUSP
40.0000 mg | Freq: Once | INTRAMUSCULAR | Status: AC
  Administered 2015-07-11: 40 mg via INTRAMUSCULAR

## 2015-07-11 MED ORDER — TRIAMCINOLONE ACETONIDE 0.5 % EX CREA: 1.0000 "application " | TOPICAL_CREAM | Freq: Three times a day (TID) | CUTANEOUS | Status: DC

## 2015-07-11 MED ORDER — TRIAMCINOLONE ACETONIDE 0.5 % EX OINT: 1.0000 "application " | TOPICAL_OINTMENT | Freq: Two times a day (BID) | CUTANEOUS | Status: DC

## 2015-07-11 NOTE — Progress Notes (Addendum)
   HPI  Patient presents today here with complaints of rash.  Patient's when she's had 2 days of itchy red rash on her right chest wall below her right breast, She denies any pain, fever, chills, sweats, difficulty breathing. She states that it is very itchy and has improved slightly with the application of lotion. She cannot think of any new detergents, or other possible allergic exposures.   PMH: Smoking status noted ROS: Per HPI  Objective: BP 124/77 mmHg  Pulse 92  Temp(Src) 97.9 F (36.6 C) (Oral)  Wt 162 lb 12.8 oz (73.846 kg) Gen: NAD, alert, cooperative with exam HEENT: NCAT Ext: No edema, warm Neuro: Alert and oriented, No gross deficits Skin: Right chest wall beneath her breast with approximately 4 cm x 6 cm erythematous macular confluent rash, no excoriations, no fluctuance, no drainage   Assessment and plan:  # Eczematous dermatitis -- Updated, see below Treat with topical steroids and frequent lotion Send prescription for Kenalog ointment Close follow-up if worsening, consider developing shingles as well as yeast dermatitis given the location    Meds ordered this encounter  Medications  . triamcinolone ointment (KENALOG) 0.5 %    Sig: Apply 1 application topically 2 (two) times daily.    Dispense:  30 g    Refill:  0    Laroy Apple, MD [facility] 07/11/2015, 8:26 AM     Interval update  Pateint returned for recheck, rash is spreading onto R shoulder, farther on abdomen, and L shoulder as well.   Exam: Similar to this maculopapular rash on right upper abdomen, right shoulder, left shoulder, the area on her right upper abdomen under her breast has gotten larger.   Assessment and plan More consistent with urticaria Discontinue Kenalog ointment Given shot of IM Kenalog in clinic Double antihistamines with daily Zyrtec, twice a day Pepcid Encouraged her to look for any possible new allergen. Reasons to return for care  were seek emergency medical care reviewed detail  Laroy Apple, MD [facility] 07/11/2015, 5:05 PM

## 2015-07-11 NOTE — Addendum Note (Signed)
Addended by: Timmothy Euler on: 07/11/2015 09:05 AM   Modules accepted: Orders, Medications

## 2015-07-11 NOTE — Addendum Note (Signed)
Addended by: Karle Plumber on: 07/11/2015 04:43 PM   Modules accepted: Orders

## 2015-07-11 NOTE — Patient Instructions (Signed)
  Try daily claritin or zyrtec Twice daily Pepcid (OTC) twice daily Look for the new allergen    Hives Hives are itchy, red, swollen areas of the skin. They can vary in size and location on your body. Hives can come and go for hours or several days (acute hives) or for several weeks (chronic hives). Hives do not spread from person to person (noncontagious). They may get worse with scratching, exercise, and emotional stress. CAUSES   Allergic reaction to food, additives, or drugs.  Infections, including the common cold.  Illness, such as vasculitis, lupus, or thyroid disease.  Exposure to sunlight, heat, or cold.  Exercise.  Stress.  Contact with chemicals. SYMPTOMS   Red or white swollen patches on the skin. The patches may change size, shape, and location quickly and repeatedly.  Itching.  Swelling of the hands, feet, and face. This may occur if hives develop deeper in the skin. DIAGNOSIS  Your caregiver can usually tell what is wrong by performing a physical exam. Skin or blood tests may also be done to determine the cause of your hives. In some cases, the cause cannot be determined. TREATMENT  Mild cases usually get better with medicines such as antihistamines. Severe cases may require an emergency epinephrine injection. If the cause of your hives is known, treatment includes avoiding that trigger.  HOME CARE INSTRUCTIONS   Avoid causes that trigger your hives.  Take antihistamines as directed by your caregiver to reduce the severity of your hives. Non-sedating or low-sedating antihistamines are usually recommended. Do not drive while taking an antihistamine.  Take any other medicines prescribed for itching as directed by your caregiver.  Wear loose-fitting clothing.  Keep all follow-up appointments as directed by your caregiver. SEEK MEDICAL CARE IF:   You have persistent or severe itching that is not relieved with medicine.  You have painful or swollen  joints. SEEK IMMEDIATE MEDICAL CARE IF:   You have a fever.  Your tongue or lips are swollen.  You have trouble breathing or swallowing.  You feel tightness in the throat or chest.  You have abdominal pain. These problems may be the first sign of a life-threatening allergic reaction. Call your local emergency services (911 in [place] MAKE SURE YOU:   Understand these instructions.  Will watch your condition.  Will get help right away if you are not doing well or get worse.   This information is not intended to replace advice given to you by your health care provider. Make sure you discuss any questions you have with your health care provider.   Document Released: 08/05/2005 Document Revised: 08/10/2013 Document Reviewed: 10/29/2011 Elsevier Interactive Patient Education [employer].

## 2015-07-18 ENCOUNTER — Encounter: Payer: Self-pay | Admitting: Family

## 2015-07-18 ENCOUNTER — Ambulatory Visit (INDEPENDENT_AMBULATORY_CARE_PROVIDER_SITE_OTHER): Payer: 59 | Admitting: Family

## 2015-07-18 VITALS — BP 105/66 | HR 73 | Temp 97.3°F | Ht 67.0 in | Wt 159.0 lb

## 2015-07-18 DIAGNOSIS — H8113 Benign paroxysmal vertigo, bilateral: Secondary | ICD-10-CM

## 2015-07-18 MED ORDER — FLUTICASONE PROPIONATE 50 MCG/ACT NA SUSP: 2.0000 | Freq: Every day | NASAL | Status: DC

## 2015-07-18 MED ORDER — MECLIZINE HCL 50 MG PO TABS: 50.0000 mg | ORAL_TABLET | Freq: Three times a day (TID) | ORAL | Status: DC | PRN

## 2015-07-18 NOTE — Progress Notes (Signed)
   Subjective:    Patient ID: Katherine Hays, female    DOB: 07/21/1971, 44 y.o.   MRN: [id number]  Pt presents to the office today with onset of dizziness. Pt states she had something similar "years ago".   Dizziness This is a new problem. The current episode started yesterday. The problem occurs constantly. The problem has been waxing and waning. Associated symptoms include nausea and vomiting. Pertinent negatives include no anorexia, chills, congestion, coughing, headaches, myalgias, swollen glands or visual change. The symptoms are aggravated by bending and twisting. She has tried rest for the symptoms. The treatment provided mild relief.      Review of Systems  Constitutional: Negative.  Negative for chills.  HENT: Negative.  Negative for congestion.   Eyes: Negative.   Respiratory: Negative.  Negative for cough and shortness of breath.   Cardiovascular: Negative.  Negative for palpitations.  Gastrointestinal: Positive for nausea and vomiting. Negative for anorexia.  Endocrine: Negative.   Genitourinary: Negative.   Musculoskeletal: Negative.  Negative for myalgias.  Neurological: Positive for dizziness. Negative for headaches.  Hematological: Negative.   Psychiatric/Behavioral: Negative.   All other systems reviewed and are negative.      Objective:   Physical Exam  Constitutional: She is oriented to person, place, and time. She appears well-developed and well-nourished. No distress.  HENT:  Head: Normocephalic and atraumatic.  Right Ear: External ear normal.  Left Ear: External ear normal.  Mouth/Throat: Oropharynx is clear and moist.  Nasal passage erythemas with mild swelling    Eyes: Pupils are equal, round, and reactive to light.  Neck: Normal range of motion. Neck supple. No thyromegaly present.  Cardiovascular: Normal rate, regular rhythm, normal heart sounds and intact distal pulses.   No murmur heard. Pulmonary/Chest: Effort normal and breath sounds  normal. No respiratory distress. She has no wheezes.  Abdominal: Soft. Bowel sounds are normal. She exhibits no distension. There is no tenderness.  Musculoskeletal: Normal range of motion. She exhibits no edema or tenderness.  Neurological: She is alert and oriented to person, place, and time. She has normal reflexes. No cranial nerve deficit.  Skin: Skin is warm and dry.  Psychiatric: She has a normal mood and affect. Her behavior is normal. Judgment and thought content normal.  Vitals reviewed.   BP 105/66 mmHg  Pulse 73  Temp(Src) 97.3 F (36.3 C) (Oral)  Ht 5\' 7"  (1.702 m)  Wt 159 lb (72.122 kg)  BMI 24.90 kg/m2       Assessment & Plan:  1. BPV (benign positional vertigo), bilateral -Falls precaution discussed -Avoid fast position changes or bending -Exercises discussed and handout given to patient -If continues will refer to PT -RTO prn  - meclizine (ANTIVERT) 50 MG tablet; Take 1 tablet (50 mg total) by mouth 3 (three) times daily as needed.  Dispense: 60 tablet; Refill: 0 - fluticasone (FLONASE) 50 MCG/ACT nasal spray; Place 2 sprays into both nostrils daily.  Dispense: 16 g; Refill: [place], FNP

## 2015-07-18 NOTE — Patient Instructions (Signed)
Benign Positional Vertigo Vertigo is the feeling that you or your surroundings are moving when they are not. Benign positional vertigo is the most common form of vertigo. The cause of this condition is not serious (is benign). This condition is triggered by certain movements and positions (is positional). This condition can be dangerous if it occurs while you are doing something that could endanger you or others, such as driving.  CAUSES In many cases, the cause of this condition is not known. It may be caused by a disturbance in an area of the inner ear that helps your brain to sense movement and balance. This disturbance can be caused by a viral infection (labyrinthitis), head injury, or repetitive motion. RISK FACTORS This condition is more likely to develop in:  Women.  People who are 50 years of age or older. SYMPTOMS Symptoms of this condition usually happen when you move your head or your eyes in different directions. Symptoms may start suddenly, and they usually last for less than a minute. Symptoms may include:  Loss of balance and falling.  Feeling like you are spinning or moving.  Feeling like your surroundings are spinning or moving.  Nausea and vomiting.  Blurred vision.  Dizziness.  Involuntary eye movement (nystagmus). Symptoms can be mild and cause only slight annoyance, or they can be severe and interfere with daily life. Episodes of benign positional vertigo may return (recur) over time, and they may be triggered by certain movements. Symptoms may improve over time. DIAGNOSIS This condition is usually diagnosed by medical history and a physical exam of the head, neck, and ears. You may be referred to a health care provider who specializes in ear, nose, and throat (ENT) problems (otolaryngologist) or a provider who specializes in disorders of the nervous system (neurologist). You may have additional testing, including:  MRI.  A CT scan.  Eye movement tests. Your  health care provider may ask you to change positions quickly while he or she watches you for symptoms of benign positional vertigo, such as nystagmus. Eye movement may be tested with an electronystagmogram (ENG), caloric stimulation, the Dix-Hallpike test, or the roll test.  An electroencephalogram (EEG). This records electrical activity in your brain.  Hearing tests. TREATMENT Usually, your health care provider will treat this by moving your head in specific positions to adjust your inner ear back to normal. Surgery may be needed in severe cases, but this is rare. In some cases, benign positional vertigo may resolve on its own in 2-4 weeks. HOME CARE INSTRUCTIONS Safety  Move slowly.Avoid sudden body or head movements.  Avoid driving.  Avoid operating heavy machinery.  Avoid doing any tasks that would be dangerous to you or others if a vertigo episode would occur.  If you have trouble walking or keeping your balance, try using a cane for stability. If you feel dizzy or unstable, sit down right away.  Return to your normal activities as told by your health care provider. Ask your health care provider what activities are safe for you. General Instructions  Take over-the-counter and prescription medicines only as told by your health care provider.  Avoid certain positions or movements as told by your health care provider.  Drink enough fluid to keep your urine clear or pale yellow.  Keep all follow-up visits as told by your health care provider. This is important. SEEK MEDICAL CARE IF:  You have a fever.  Your condition gets worse or you develop new symptoms.  Your family or friends   notice any behavioral changes.  Your nausea or vomiting gets worse.  You have numbness or a "pins and needles" sensation. SEEK IMMEDIATE MEDICAL CARE IF:  You have difficulty speaking or moving.  You are always dizzy.  You faint.  You develop severe headaches.  You have weakness in your  legs or arms.  You have changes in your hearing or vision.  You develop a stiff neck.  You develop sensitivity to light.   This information is not intended to replace advice given to you by your health care provider. Make sure you discuss any questions you have with your health care provider.   Document Released: 05/13/2006 Document Revised: 04/26/2015 Document Reviewed: 11/28/2014 Elsevier Interactive Patient Education 2016 Elsevier Inc[employer]  

## 2015-07-27 ENCOUNTER — Ambulatory Visit (INDEPENDENT_AMBULATORY_CARE_PROVIDER_SITE_OTHER): Payer: 59 | Admitting: Family Medicine

## 2015-07-27 ENCOUNTER — Encounter: Payer: Self-pay | Admitting: Family Medicine

## 2015-07-27 VITALS — BP 108/71 | HR 68 | Temp 97.1°F | Ht 67.0 in | Wt 162.4 lb

## 2015-07-27 DIAGNOSIS — H8111 Benign paroxysmal vertigo, right ear: Secondary | ICD-10-CM | POA: Diagnosis not present

## 2015-07-27 NOTE — Patient Instructions (Addendum)
Great to see you!  Try the epley maneuvers if you need them  Let me know if the numbness gets worse or anything worries you.  Benign Positional Vertigo Vertigo is the feeling that you or your surroundings are moving when they are not. Benign positional vertigo is the most common form of vertigo. The cause of this condition is not serious (is benign). This condition is triggered by certain movements and positions (is positional). This condition can be dangerous if it occurs while you are doing something that could endanger you or others, such as driving.  CAUSES In many cases, the cause of this condition is not known. It may be caused by a disturbance in an area of the inner ear that helps your brain to sense movement and balance. This disturbance can be caused by a viral infection (labyrinthitis), head injury, or repetitive motion. RISK FACTORS This condition is more likely to develop in:  Women.  People who are 33 years of age or older. SYMPTOMS Symptoms of this condition usually happen when you move your head or your eyes in different directions. Symptoms may start suddenly, and they usually last for less than a minute. Symptoms may include:  Loss of balance and falling.  Feeling like you are spinning or moving.  Feeling like your surroundings are spinning or moving.  Nausea and vomiting.  Blurred vision.  Dizziness.  Involuntary eye movement (nystagmus). Symptoms can be mild and cause only slight annoyance, or they can be severe and interfere with daily life. Episodes of benign positional vertigo may return (recur) over time, and they may be triggered by certain movements. Symptoms may improve over time. DIAGNOSIS This condition is usually diagnosed by medical history and a physical exam of the head, neck, and ears. You may be referred to a health care provider who specializes in ear, nose, and throat (ENT) problems (otolaryngologist) or a provider who specializes in disorders of  the nervous system (neurologist). You may have additional testing, including:  MRI.  A CT scan.  Eye movement tests. Your health care provider may ask you to change positions quickly while he or she watches you for symptoms of benign positional vertigo, such as nystagmus. Eye movement may be tested with an electronystagmogram (ENG), caloric stimulation, the Dix-Hallpike test, or the roll test.  An electroencephalogram (EEG). This records electrical activity in your brain.  Hearing tests. TREATMENT Usually, your health care provider will treat this by moving your head in specific positions to adjust your inner ear back to normal. Surgery may be needed in severe cases, but this is rare. In some cases, benign positional vertigo may resolve on its own in 2-4 weeks. HOME CARE INSTRUCTIONS Safety  Move slowly.Avoid sudden body or head movements.  Avoid driving.  Avoid operating heavy machinery.  Avoid doing any tasks that would be dangerous to you or others if a vertigo episode would occur.  If you have trouble walking or keeping your balance, try using a cane for stability. If you feel dizzy or unstable, sit down right away.  Return to your normal activities as told by your health care provider. Ask your health care provider what activities are safe for you. General Instructions  Take over-the-counter and prescription medicines only as told by your health care provider.  Avoid certain positions or movements as told by your health care provider.  Drink enough fluid to keep your urine clear or pale yellow.  Keep all follow-up visits as told by your health care provider.  This is important. SEEK MEDICAL CARE IF:  You have a fever.  Your condition gets worse or you develop new symptoms.  Your family or friends notice any behavioral changes.  Your nausea or vomiting gets worse.  You have numbness or a "pins and needles" sensation. SEEK IMMEDIATE MEDICAL CARE IF:  You have  difficulty speaking or moving.  You are always dizzy.  You faint.  You develop severe headaches.  You have weakness in your legs or arms.  You have changes in your hearing or vision.  You develop a stiff neck.  You develop sensitivity to light.   This information is not intended to replace advice given to you by your health care provider. Make sure you discuss any questions you have with your health care provider.   Document Released: 05/13/2006 Document Revised: 04/26/2015 Document Reviewed: 11/28/2014 Elsevier Interactive Patient Education [employer].

## 2015-07-27 NOTE — Progress Notes (Signed)
   HPI  Patient presents today here to discuss dizziness and right-sided facial numbness.  Patient explains that for the last 2 weeks she's had dizziness with room spinning sensation when she turns her head to the right. It's worse in the morning and by the time she goes to bed at completely resolved. She was seen a few days ago and have been doing Epley's maneuvers to try and correct the problem on her own without much improvement. She had recurrent symptoms this morning which caused her to come in today.  She also complains of right-sided facial numbness that seems to be associated with vertigo. She has no loss of motor function, no difficulty swallowing or talking Her smile appears normal to her.  She denies fever, chills, sweats, or symptoms of illness  PMH: Smoking status noted ROS: Per HPI  Objective: BP 108/71 mmHg  Pulse 68  Temp(Src) 97.1 F (36.2 C) (Oral)  Ht 5\' 7"  (1.702 m)  Wt 162 lb 6.4 oz (73.664 kg)  BMI 25.43 kg/m2 Gen: NAD, alert, cooperative with exam HEENT: NCAT, EOMI, PERRL, TMs normal bilaterally  CV: RRR, good S1/S2, no murmur Resp: CTABL, no wheezes, non-labored Ext: No edema, warm Neuro: Alert and oriented, vertiginous symptoms reproduced with Epley's maneuver on rightward head turn, strength 5/5 and sensation intact in all 4 extremities, cranial nerves II through XII intact  Epley's maneuvers performed in the clinic with some improvement in symptoms  Assessment and plan:  # benign positional vertigo Discussed Epley's maneuvers, also discussed the possibility of a viral infection involving the vestibule and possibly the trigeminal nerve with her facial numbness. Neurologic exam is very reassuring She has no other signs of illness. Follow-up as needed, return if worsening or does not improve as expected    Laroy Apple, MD [facility] 07/27/2015, 10:27 AM

## 2015-08-09 ENCOUNTER — Telehealth: Payer: Self-pay | Admitting: Family

## 2015-08-09 MED ORDER — FLUCONAZOLE 150 MG PO TABS: 150.0000 mg | ORAL_TABLET | ORAL | Status: DC

## 2015-08-09 NOTE — Telephone Encounter (Signed)
Prescription sent to pharmacy.

## 2015-09-12 ENCOUNTER — Other Ambulatory Visit: Payer: Self-pay | Admitting: Family

## 2015-09-12 ENCOUNTER — Telehealth: Payer: Self-pay | Admitting: *Deleted

## 2015-09-12 ENCOUNTER — Ambulatory Visit (INDEPENDENT_AMBULATORY_CARE_PROVIDER_SITE_OTHER): Payer: 59 | Admitting: Family

## 2015-09-12 ENCOUNTER — Ambulatory Visit (INDEPENDENT_AMBULATORY_CARE_PROVIDER_SITE_OTHER): Payer: 59

## 2015-09-12 VITALS — BP 113/68 | HR 70 | Temp 97.8°F | Ht 67.0 in | Wt 166.0 lb

## 2015-09-12 DIAGNOSIS — M546 Pain in thoracic spine: Secondary | ICD-10-CM

## 2015-09-12 DIAGNOSIS — M545 Low back pain, unspecified: Secondary | ICD-10-CM

## 2015-09-12 LAB — POCT UA - MICROSCOPIC ONLY
Bacteria, U Microscopic: NEGATIVE
CASTS, UR, LPF, POC: NEGATIVE
Crystals, Ur, HPF, POC: NEGATIVE
Mucus, UA: NEGATIVE
YEAST UA: NEGATIVE

## 2015-09-12 LAB — POCT URINALYSIS DIPSTICK
BILIRUBIN UA: NEGATIVE
Glucose, UA: NEGATIVE
Ketones, UA: NEGATIVE
NITRITE UA: NEGATIVE
PH UA: 7
PROTEIN UA: NEGATIVE
Spec Grav, UA: 1.01
UROBILINOGEN UA: NEGATIVE

## 2015-09-12 MED ORDER — NAPROXEN 500 MG PO TABS: 500.0000 mg | ORAL_TABLET | Freq: Two times a day (BID) | ORAL | Status: DC

## 2015-09-12 MED ORDER — SULFAMETHOXAZOLE-TRIMETHOPRIM 800-160 MG PO TABS: 1.0000 | ORAL_TABLET | Freq: Two times a day (BID) | ORAL | Status: DC

## 2015-09-12 NOTE — Patient Instructions (Signed)
Osteoporosis  Osteoporosis is the thinning and loss of density in the bones. Osteoporosis makes the bones more brittle, fragile, and likely to break (fracture). Over time, osteoporosis can cause the bones to become so weak that they fracture after a simple fall. The bones most likely to fracture are the bones in the hip, wrist, and spine.  CAUSES   The exact cause is not known.  RISK FACTORS  Anyone can develop osteoporosis. You may be at greater risk if you have a family history of the condition or have poor nutrition. You may also have a higher risk if you are:   · Female.    · 50 years old or older.  · A smoker.  · Not physically active.    · White or Asian.  · Slender.  SIGNS AND SYMPTOMS   A fracture might be the first sign of the disease, especially if it results from a fall or injury that would not usually cause a bone to break. Other signs and symptoms include:   · Low back and neck pain.  · Stooped posture.  · Height loss.  DIAGNOSIS   To make a diagnosis, your health care provider may:  · Take a medical history.  · Perform a physical exam.  · Order tests, such as:    A bone mineral density test.    A dual-energy X-ray absorptiometry test.  TREATMENT   The goal of osteoporosis treatment is to strengthen your bones to reduce your risk of a fracture. Treatment may involve:  · Making lifestyle changes, such as:    Eating a diet rich in calcium.    Doing weight-bearing and muscle-strengthening exercises.    Stopping tobacco use.    Limiting alcohol intake.  · Taking medicine to slow the process of bone loss or to increase bone density.  · Monitoring your levels of calcium and vitamin D.  HOME CARE INSTRUCTIONS  · Include calcium and vitamin D in your diet. Calcium is important for bone health, and vitamin D helps the body absorb calcium.  · Perform weight-bearing and muscle-strengthening exercises as directed by your health care provider.  · Do not use any tobacco products, including cigarettes, chewing  tobacco, and electronic cigarettes. If you need help quitting, ask your health care provider.  · Limit your alcohol intake.  · Take medicines only as directed by your health care provider.  · Keep all follow-up visits as directed by your health care provider. This is important.  · Take precautions at home to lower your risk of falling, such as:    Keeping rooms well lit and clutter free.    Installing safety rails on stairs.    Using rubber mats in the bathroom and other areas that are often wet or slippery.  SEEK IMMEDIATE MEDICAL CARE IF:   You fall or injure yourself.      This information is not intended to replace advice given to you by your health care provider. Make sure you discuss any questions you have with your health care provider.     Document Released: 05/15/2005 Document Revised: 08/26/2014 Document Reviewed: 01/13/2014  Elsevier Interactive Patient Education ©2016 [employer] Inc[employer]

## 2015-09-12 NOTE — Addendum Note (Signed)
Addended by: Earlene Plater on: 09/12/2015 05:46 PM   Modules accepted: Orders

## 2015-09-12 NOTE — Telephone Encounter (Signed)
Aware of results at visit.

## 2015-09-12 NOTE — Progress Notes (Addendum)
   Subjective:    Patient ID: Katherine Hays, female    DOB: 12-16-1970, 45 y.o.   MRN: [id number]  Back Pain This is a new problem. The current episode started more than 1 year ago. The problem occurs intermittently. The problem is unchanged. The pain is present in the thoracic spine. The quality of the pain is described as aching. The pain is at a severity of 1/10. The pain is mild. The pain is the same all the time. The symptoms are aggravated by bending and twisting. Pertinent negatives include no bladder incontinence, bowel incontinence, dysuria, headaches, leg pain, numbness or tingling. She has tried bed rest and NSAIDs for the symptoms. The treatment provided mild relief.      Review of Systems  Constitutional: Negative.   HENT: Negative.   Eyes: Negative.   Respiratory: Negative.  Negative for shortness of breath.   Cardiovascular: Negative.  Negative for palpitations.  Gastrointestinal: Negative.  Negative for bowel incontinence.  Endocrine: Negative.   Genitourinary: Negative.  Negative for bladder incontinence and dysuria.  Musculoskeletal: Positive for back pain.  Neurological: Negative.  Negative for tingling, numbness and headaches.  Hematological: Negative.   Psychiatric/Behavioral: Negative.   All other systems reviewed and are negative.      Objective:   Physical Exam  Constitutional: She is oriented to person, place, and time. She appears well-developed and well-nourished. No distress.  HENT:  Head: Normocephalic and atraumatic.  Right Ear: External ear normal.  Left Ear: External ear normal.  Nose: Nose normal.  Mouth/Throat: Oropharynx is clear and moist.  Eyes: Pupils are equal, round, and reactive to light.  Neck: Normal range of motion. Neck supple. No thyromegaly present.  Cardiovascular: Normal rate, regular rhythm, normal heart sounds and intact distal pulses.   No murmur heard. Pulmonary/Chest: Effort normal and breath sounds normal. No  respiratory distress. She has no wheezes.  Abdominal: Soft. Bowel sounds are normal. She exhibits no distension. There is no tenderness.  Musculoskeletal: Normal range of motion. She exhibits no edema or tenderness.  Full ROM of back   Neurological: She is alert and oriented to person, place, and time. She has normal reflexes. No cranial nerve deficit.  Skin: Skin is warm and dry.  Psychiatric: She has a normal mood and affect. Her behavior is normal. Judgment and thought content normal.  Vitals reviewed.   Lumbar Xray- WNL Preliminary reading by Evelina Dun, FNP [facility]   BP 113/68 mmHg  Pulse 70  Temp(Src) 97.8 F (36.6 C) (Oral)  Ht 5\' 7"  (1.702 m)  Wt 166 lb (75.297 kg)  BMI 25.99 kg/m2     Assessment & Plan:  1. Midline thoracic back pain -Rest -Encouraged exercise -Weight lose  - naproxen (NAPROSYN) 500 MG tablet; Take 1 tablet (500 mg total) by mouth 2 (two) times daily with a meal.  Dispense: 60 tablet; Refill: 1 - POCT UA - Microscopic Only - POCT urinalysis dipstick  Discussed osteoporosis risk since pt's grandmother had osteoporosis. Discussed importance of calcium and vit d in diet Weight bearing exercises Avoid smoking and alcohol RTO prn   Evelina Dun, FNP

## 2015-09-14 LAB — URINE CULTURE

## 2015-09-22 ENCOUNTER — Encounter: Payer: Self-pay | Admitting: Family

## 2015-09-22 ENCOUNTER — Ambulatory Visit (INDEPENDENT_AMBULATORY_CARE_PROVIDER_SITE_OTHER): Payer: 59 | Admitting: Family

## 2015-09-22 VITALS — BP 104/62 | HR 77 | Temp 97.0°F | Ht 67.0 in | Wt 166.0 lb

## 2015-09-22 DIAGNOSIS — N926 Irregular menstruation, unspecified: Secondary | ICD-10-CM

## 2015-09-22 DIAGNOSIS — N959 Unspecified menopausal and perimenopausal disorder: Secondary | ICD-10-CM | POA: Diagnosis not present

## 2015-09-22 MED ORDER — NORGESTIM-ETH ESTRAD TRIPHASIC 0.18/0.215/0.25 MG-25 MCG PO TABS: 1.0000 | ORAL_TABLET | Freq: Every day | ORAL | Status: DC

## 2015-09-22 NOTE — Progress Notes (Signed)
   Subjective:    Patient ID: Katherine Hays, female    DOB: 1970-10-20, 45 y.o.   MRN: [id number]  HPI PT presents to the office today with irregular menstrual cycle. Over the last 6 weeks, pt states she has had her "period every other week". PT states she has had moderate bleeding. Pt states since she had her tubal ligation she sometimes would have irregular periods, but not like this. Pt denies any cramps or abdominal pain.    Review of Systems  Constitutional: Negative.   HENT: Negative.   Eyes: Negative.   Respiratory: Negative.  Negative for shortness of breath.   Cardiovascular: Negative.  Negative for palpitations.  Gastrointestinal: Negative.   Endocrine: Negative.   Genitourinary: Negative.   Musculoskeletal: Negative.   Neurological: Negative.  Negative for headaches.  Hematological: Negative.   Psychiatric/Behavioral: Negative.   All other systems reviewed and are negative.      Objective:   Physical Exam  Constitutional: She is oriented to person, place, and time. She appears well-developed and well-nourished. No distress.  HENT:  Head: Normocephalic and atraumatic.  Right Ear: External ear normal.  Left Ear: External ear normal.  Mouth/Throat: Oropharynx is clear and moist.  Eyes: Pupils are equal, round, and reactive to light.  Neck: Normal range of motion. Neck supple. No thyromegaly present.  Cardiovascular: Normal rate, regular rhythm, normal heart sounds and intact distal pulses.   No murmur heard. Pulmonary/Chest: Effort normal and breath sounds normal. No respiratory distress. She has no wheezes.  Abdominal: Soft. Bowel sounds are normal. She exhibits no distension. There is no tenderness.  Musculoskeletal: Normal range of motion. She exhibits no edema or tenderness.  Neurological: She is alert and oriented to person, place, and time. She has normal reflexes. No cranial nerve deficit.  Skin: Skin is warm and dry.  Psychiatric: She has a normal mood  and affect. Her behavior is normal. Judgment and thought content normal.  Vitals reviewed.   BP 104/62 mmHg  Pulse 77  Temp(Src) 97 F (36.1 C) (Oral)  Ht 5\' 7"  (1.702 m)  Wt 166 lb (75.297 kg)  BMI 25.99 kg/m2       Assessment & Plan:  1. Irregular menstrual cycle -Pt given birth control pills for a 3 month trial -Pt states she does not smoke and discussed risks of thromboembolic event - Norgestimate-Ethinyl Estradiol Triphasic (ORTHO TRI-CYCLEN LO) 0.18/0.215/0.25 MG-25 MCG tab; Take 1 tablet by mouth daily.  Dispense: 3 Package; Refill: 1  2. Premenopausal patient -Discussed menopause symptoms - Norgestimate-Ethinyl Estradiol Triphasic (ORTHO TRI-CYCLEN LO) 0.18/0.215/0.25 MG-25 MCG tab; Take 1 tablet by mouth daily.  Dispense: 3 Package; Refill: [place], FNP

## 2015-09-22 NOTE — Patient Instructions (Signed)

## 2015-09-28 ENCOUNTER — Encounter: Payer: Self-pay | Admitting: Family Medicine

## 2015-09-28 ENCOUNTER — Ambulatory Visit (INDEPENDENT_AMBULATORY_CARE_PROVIDER_SITE_OTHER): Payer: 59 | Admitting: Family Medicine

## 2015-09-28 VITALS — BP 110/72 | HR 71 | Temp 97.0°F | Ht 67.0 in | Wt 170.2 lb

## 2015-09-28 DIAGNOSIS — Z20828 Contact with and (suspected) exposure to other viral communicable diseases: Secondary | ICD-10-CM

## 2015-09-28 DIAGNOSIS — J029 Acute pharyngitis, unspecified: Secondary | ICD-10-CM

## 2015-09-28 LAB — POCT INFLUENZA A/B
Influenza A, POC: NEGATIVE
Influenza B, POC: NEGATIVE

## 2015-09-28 MED ORDER — OSELTAMIVIR PHOSPHATE 75 MG PO CAPS: 75.0000 mg | ORAL_CAPSULE | Freq: Two times a day (BID) | ORAL | Status: DC

## 2015-09-28 NOTE — Progress Notes (Signed)
BP 110/72 mmHg  Pulse 71  Temp(Src) 97 F (36.1 C) (Oral)  Ht 5\' 7"  (1.702 m)  Wt 170 lb 3.2 oz (77.202 kg)  BMI 26.65 kg/m2  LMP 09/28/2015   Subjective:    Patient ID: Katherine Hays, female    DOB: 1970/10/16, 45 y.o.   MRN: [id number]  HPI: Katherine Hays is a 45 y.o. female presenting on 09/28/2015 for Chills and Headache   HPI Sore throat Patient has a child that has influenza positive and has been sick over the past 2 days. He has been around her in sleeping in bed with her and she started having symptoms about 2 hours ago. She denies any fevers but has had chills and has had some mild achiness starting up. She also has a little bit of a sore throat or tickle start in the back of her throat. She denies any nasal drainage as yet. She thinks it might be the start of the flu coming on and wants get tested for it.  Relevant past medical, surgical, family and social history reviewed and updated as indicated. Interim medical history since our last visit reviewed. Allergies and medications reviewed and updated.  Review of Systems  Constitutional: Positive for chills. Negative for fever.  HENT: Positive for sore throat. Negative for congestion, ear discharge, ear pain, postnasal drip, rhinorrhea, sinus pressure and sneezing.   Eyes: Negative for pain, redness and visual disturbance.  Respiratory: Positive for cough. Negative for chest tightness and shortness of breath.   Cardiovascular: Negative for chest pain and leg swelling.  Genitourinary: Negative for dysuria and difficulty urinating.  Musculoskeletal: Positive for myalgias. Negative for back pain and gait problem.  Skin: Negative for color change and rash.  Neurological: Negative for light-headedness and headaches.  Psychiatric/Behavioral: Negative for behavioral problems and agitation.  All other systems reviewed and are negative.   Per HPI unless specifically indicated above     Medication List       This  list is accurate as of: 09/28/15 12:04 PM.  Always use your most recent med list.               ALPRAZolam 0.5 MG tablet  Commonly known as:  XANAX  Take 1 tablet (0.5 mg total) by mouth 2 (two) times daily.     loperamide 2 MG tablet  Commonly known as:  IMODIUM A-D  Take 2 mg by mouth 4 (four) times daily as needed for diarrhea or loose stools. Reported on 09/22/2015     Norgestimate-Ethinyl Estradiol Triphasic 0.18/0.215/0.25 MG-25 MCG tab  Commonly known as:  ORTHO TRI-CYCLEN LO  Take 1 tablet by mouth daily.     pantoprazole 20 MG tablet  Commonly known as:  PROTONIX  Take 1 tablet (20 mg total) by mouth daily.     sertraline 50 MG tablet  Commonly known as:  ZOLOFT  Take 1 tablet (50 mg total) by mouth daily.           Objective:    BP 110/72 mmHg  Pulse 71  Temp(Src) 97 F (36.1 C) (Oral)  Ht 5\' 7"  (1.702 m)  Wt 170 lb 3.2 oz (77.202 kg)  BMI 26.65 kg/m2  LMP 09/28/2015  Wt Readings from Last 3 Encounters:  09/28/15 170 lb 3.2 oz (77.202 kg)  09/22/15 166 lb (75.297 kg)  09/12/15 166 lb (75.297 kg)    Physical Exam  Constitutional: She is oriented to person, place, and time. She appears well-developed and well-nourished.  No distress.  HENT:  Right Ear: Tympanic membrane, external ear and ear canal normal.  Left Ear: Tympanic membrane, external ear and ear canal normal.  Nose: No mucosal edema or rhinorrhea. No epistaxis. Right sinus exhibits no maxillary sinus tenderness and no frontal sinus tenderness. Left sinus exhibits no maxillary sinus tenderness and no frontal sinus tenderness.  Mouth/Throat: Uvula is midline and mucous membranes are normal. Posterior oropharyngeal edema present. No oropharyngeal exudate, posterior oropharyngeal erythema or tonsillar abscesses.  Eyes: Conjunctivae and EOM are normal.  Cardiovascular: Normal rate, regular rhythm, normal heart sounds and intact distal pulses.   No murmur heard. Pulmonary/Chest: Effort normal and breath  sounds normal. No respiratory distress. She has no wheezes.  Musculoskeletal: Normal range of motion. She exhibits no edema or tenderness.  Neurological: She is alert and oriented to person, place, and time. Coordination normal.  Skin: Skin is warm and dry. No rash noted. She is not diaphoretic.  Psychiatric: She has a normal mood and affect. Her behavior is normal.  Vitals reviewed.   Results for orders placed or performed in visit on 09/28/15  POCT Influenza A/B  Result Value Ref Range   Influenza A, POC Negative Negative   Influenza B, POC Negative Negative      Assessment & Plan:   Problem List Items Addressed This Visit    None    Visit Diagnoses    Exposure to the flu    -  Primary    Relevant Medications    oseltamivir (TAMIFLU) 75 MG capsule    Other Relevant Orders    POCT Influenza A/B    Acute pharyngitis, unspecified etiology        Given prescription for Tamiflu and if symptoms start coming more then go pick it up.    Relevant Medications    oseltamivir (TAMIFLU) 75 MG capsule        Follow up plan: Return if symptoms worsen or fail to improve.  Counseling provided for all of the vaccine components Orders Placed This Encounter  Procedures  . POCT Influenza A/B    Caryl Pina, MD [facility] 09/28/2015, 12:04 PM

## 2015-09-29 ENCOUNTER — Other Ambulatory Visit: Payer: Self-pay | Admitting: Family

## 2015-09-29 MED ORDER — NORGESTIMATE-ETH ESTRADIOL 0.25-35 MG-MCG PO TABS: 1.0000 | ORAL_TABLET | Freq: Every day | ORAL | Status: DC

## 2015-10-04 DIAGNOSIS — N938 Other specified abnormal uterine and vaginal bleeding: Secondary | ICD-10-CM | POA: Diagnosis not present

## 2015-10-11 ENCOUNTER — Other Ambulatory Visit: Payer: Self-pay | Admitting: Family

## 2015-10-11 DIAGNOSIS — F411 Generalized anxiety disorder: Secondary | ICD-10-CM

## 2015-10-11 DIAGNOSIS — G4700 Insomnia, unspecified: Secondary | ICD-10-CM

## 2015-10-12 MED ORDER — ALPRAZOLAM 0.5 MG PO TABS: 0.5000 mg | ORAL_TABLET | Freq: Two times a day (BID) | ORAL | Status: DC

## 2015-10-12 NOTE — Telephone Encounter (Signed)
I already refilled this today to be called to pharmacy

## 2015-10-12 NOTE — Telephone Encounter (Signed)
Noted  

## 2015-10-16 MED FILL — ALPRAZolam 0.5 MG TABS: 0.5 | 30 days supply | Qty: 60 | Fill #0

## 2015-11-09 DIAGNOSIS — Z30430 Encounter for insertion of intrauterine contraceptive device: Secondary | ICD-10-CM | POA: Diagnosis not present

## 2015-11-13 ENCOUNTER — Ambulatory Visit (INDEPENDENT_AMBULATORY_CARE_PROVIDER_SITE_OTHER): Payer: 59 | Admitting: Family Medicine

## 2015-11-13 ENCOUNTER — Encounter: Payer: Self-pay | Admitting: Family Medicine

## 2015-11-13 VITALS — BP 103/69 | HR 95 | Temp 98.9°F | Ht 67.0 in | Wt 163.4 lb

## 2015-11-13 DIAGNOSIS — L03012 Cellulitis of left finger: Secondary | ICD-10-CM | POA: Diagnosis not present

## 2015-11-13 DIAGNOSIS — IMO0001 Reserved for inherently not codable concepts without codable children: Secondary | ICD-10-CM

## 2015-11-13 MED ORDER — SULFAMETHOXAZOLE-TRIMETHOPRIM 800-160 MG PO TABS: 1.0000 | ORAL_TABLET | Freq: Two times a day (BID) | ORAL | Status: DC

## 2015-11-13 NOTE — Progress Notes (Signed)
BP 103/69 mmHg  Pulse 95  Temp(Src) 98.9 F (37.2 C) (Oral)  Ht 5\' 7"  (1.702 m)  Wt 163 lb 6.4 oz (74.118 kg)  BMI 25.59 kg/m2  LMP 10/30/2015 (Approximate)   Subjective:    Patient ID: Katherine Hays, female    DOB: [birth year], 45 y.o.   MRN: [id number]  HPI: Katherine Hays is a 45 y.o. female presenting on 11/13/2015 for Pain and swelling around nailbed of 3rd finger   HPI Pain and swelling around the nail bed Patient has been having pain and swelling around the nail bed of her middle finger on her left hand for the past 1 week. She thinks she may have cut it initially and then she tried to use sitz baths and alcohol to clean it but it just kept getting worse. She is also had some purulent drainage out of the site yesterday. She denies any fevers or chills or swelling or infection anywhere else.  Relevant past medical, surgical, family and social history reviewed and updated as indicated. Interim medical history since our last visit reviewed. Allergies and medications reviewed and updated.  Review of Systems  Constitutional: Negative for fever and chills.  HENT: Negative for congestion, ear discharge and ear pain.   Eyes: Negative for redness and visual disturbance.  Respiratory: Negative for chest tightness and shortness of breath.   Cardiovascular: Negative for chest pain and leg swelling.  Genitourinary: Negative for dysuria and difficulty urinating.  Musculoskeletal: Negative for back pain and gait problem.  Skin: Positive for color change and wound. Negative for rash.  Neurological: Negative for light-headedness and headaches.  Psychiatric/Behavioral: Negative for behavioral problems and agitation.  All other systems reviewed and are negative.   Per HPI unless specifically indicated above     Medication List       This list is accurate as of: 11/13/15  8:26 AM.  Always use your most recent med list.               ALPRAZolam 0.5 MG tablet  Commonly  known as:  XANAX  Take 1 tablet (0.5 mg total) by mouth 2 (two) times daily.     levonorgestrel 20 MCG/24HR IUD  Commonly known as:  MIRENA  1 each by Intrauterine route once.     loperamide 2 MG tablet  Commonly known as:  IMODIUM A-D  Take 2 mg by mouth 4 (four) times daily as needed for diarrhea or loose stools. Reported on 09/22/2015     pantoprazole 20 MG tablet  Commonly known as:  PROTONIX  Take 1 tablet (20 mg total) by mouth daily.     sertraline 50 MG tablet  Commonly known as:  ZOLOFT  Take 1 tablet (50 mg total) by mouth daily.     sulfamethoxazole-trimethoprim 800-160 MG tablet  Commonly known as:  BACTRIM DS,SEPTRA DS  Take 1 tablet by mouth 2 (two) times daily.           Objective:    BP 103/69 mmHg  Pulse 95  Temp(Src) 98.9 F (37.2 C) (Oral)  Ht 5\' 7"  (1.702 m)  Wt 163 lb 6.4 oz (74.118 kg)  BMI 25.59 kg/m2  LMP 10/30/2015 (Approximate)  Wt Readings from Last 3 Encounters:  11/13/15 163 lb 6.4 oz (74.118 kg)  09/28/15 170 lb 3.2 oz (77.202 kg)  09/22/15 166 lb (75.297 kg)    Physical Exam  Constitutional: She is oriented to person, place, and time. She appears well-developed and well-nourished. No  distress.  Eyes: Conjunctivae and EOM are normal. Pupils are equal, round, and reactive to light.  Cardiovascular: Normal rate, regular rhythm, normal heart sounds and intact distal pulses.   No murmur heard. Pulmonary/Chest: Effort normal and breath sounds normal. No respiratory distress. She has no wheezes.  Musculoskeletal: Normal range of motion. She exhibits no edema or tenderness.  Neurological: She is alert and oriented to person, place, and time. Coordination normal.  Skin: Skin is warm and dry. No rash noted. She is not diaphoretic. There is erythema (Swelling and tenderness and erythema on the lateral paronychia of middle finger on left hand. No induration or fluctuance noted on exam.).  Psychiatric: She has a normal mood and affect. Her behavior  is normal.  Nursing note and vitals reviewed.     Assessment & Plan:   Problem List Items Addressed This Visit    None    Visit Diagnoses    Paronychia of third finger of left hand    -  Primary    Relevant Medications    sulfamethoxazole-trimethoprim (BACTRIM DS,SEPTRA DS) 800-160 MG tablet       Follow up plan: Return if symptoms worsen or fail to improve.  Counseling provided for all of the vaccine components No orders of the defined types were placed in this encounter.    Caryl Pina, MD [facility] 11/13/2015, 8:26 AM

## 2015-11-23 ENCOUNTER — Ambulatory Visit (INDEPENDENT_AMBULATORY_CARE_PROVIDER_SITE_OTHER): Payer: 59 | Admitting: Nurse Practitioner

## 2015-11-23 VITALS — BP 104/69 | HR 89 | Temp 97.1°F | Ht 67.0 in | Wt 163.0 lb

## 2015-11-23 DIAGNOSIS — J209 Acute bronchitis, unspecified: Secondary | ICD-10-CM | POA: Diagnosis not present

## 2015-11-23 MED ORDER — AZITHROMYCIN 250 MG PO TABS: ORAL_TABLET | ORAL | Status: DC

## 2015-11-23 MED ORDER — METHYLPREDNISOLONE ACETATE 80 MG/ML IJ SUSP
80.0000 mg | Freq: Once | INTRAMUSCULAR | Status: AC
  Administered 2015-11-23: 80 mg via INTRAMUSCULAR

## 2015-11-23 NOTE — Progress Notes (Signed)
   Subjective:    Patient ID: Katherine Hays, female    DOB: 03-21-1971, 45 y.o.   MRN: [id number]  HPI Patient comes in today c/o cough and wheezing. Cough is non productive- she denies fever. Just started yesterday. Her daughter has been sick for over a week.    Review of Systems  Constitutional: Negative.   HENT: Negative.   Respiratory: Negative.   Cardiovascular: Negative.   Gastrointestinal: Negative.   Genitourinary: Negative.   Neurological: Negative.   Psychiatric/Behavioral: Negative.   All other systems reviewed and are negative.      Objective:   Physical Exam  Constitutional: She is oriented to person, place, and time. She appears well-developed and well-nourished.  HENT:  Right Ear: External ear normal.  Left Ear: External ear normal.  Nose: Nose normal.  Mouth/Throat: Oropharynx is clear and moist.  Eyes: Pupils are equal, round, and reactive to light.  Neck: Normal range of motion. Neck supple.  Cardiovascular: Normal rate, regular rhythm and normal heart sounds.   Pulmonary/Chest: Effort normal. She has wheezes (expiratory wheezes bil bases- louder on right then left).  Dry dry cough  Neurological: She is alert and oriented to person, place, and time.  Skin: Skin is warm.  Psychiatric: She has a normal mood and affect. Her behavior is normal. Judgment and thought content normal.    BP 104/69 mmHg  Pulse 89  Temp(Src) 97.1 F (36.2 C) (Oral)  Ht 5\' 7"  (1.702 m)  Wt 163 lb (73.936 kg)  BMI 25.52 kg/m2  LMP 10/30/2015 (Approximate)       Assessment & Plan:  1. Acute bronchitis, unspecified organism 1. Take meds as prescribed 2. Use a cool mist humidifier especially during the winter months and when heat has been humid. 3. Use saline nose sprays frequently 4. Saline irrigations of the nose can be very helpful if done frequently.  * 4X daily for 1 week*  * Use of a nettie pot can be helpful with this. Follow directions with this* 5. Drink  plenty of fluids 6. Keep thermostat turn down low 7.For any cough or congestion  Use plain Mucinex- regular strength or max strength is fine   * Children- consult with Pharmacist for dosing 8. For fever or aces or pains- take tylenol or ibuprofen appropriate for age and weight.  * for fevers greater than 101 orally you may alternate ibuprofen and tylenol every  3 hours.   - azithromycin (ZITHROMAX Z-PAK) 250 MG tablet; As directed  Dispense: 1 each; Refill: 0 - methylPREDNISolone acetate (DEPO-MEDROL) injection 80 mg; Inject 1 mL (80 mg total) into the muscle once.  Mary-Margaret Hassell Done, FNP

## 2015-11-23 NOTE — Patient Instructions (Signed)

## 2015-11-24 ENCOUNTER — Encounter: Payer: Self-pay | Admitting: Nurse Practitioner

## 2015-11-24 ENCOUNTER — Ambulatory Visit (INDEPENDENT_AMBULATORY_CARE_PROVIDER_SITE_OTHER): Payer: 59 | Admitting: Nurse Practitioner

## 2015-11-24 VITALS — BP 120/73 | HR 100 | Temp 101.6°F | Ht 67.0 in | Wt 163.0 lb

## 2015-11-24 DIAGNOSIS — R52 Pain, unspecified: Secondary | ICD-10-CM

## 2015-11-24 DIAGNOSIS — J069 Acute upper respiratory infection, unspecified: Secondary | ICD-10-CM

## 2015-11-24 LAB — CULTURE, GROUP A STREP

## 2015-11-24 LAB — VERITOR FLU A/B WAIVED
Influenza A: NEGATIVE
Influenza B: NEGATIVE

## 2015-11-24 LAB — RAPID STREP SCREEN (MED CTR MEBANE ONLY): STREP GP A AG, IA W/REFLEX: NEGATIVE

## 2015-11-24 NOTE — Progress Notes (Signed)
   Subjective:    Patient ID: Katherine Hays, female    DOB: 08/14/1971, 45 y.o.   MRN: [id number]  HPI  Patient was seen yesterday with cough- was dx with bronchitis and was given steroid shot and z pak- last night she developed body aches and fever of 100.8. Here today to make sure does not have the flu.   Review of Systems  Constitutional: Negative for fever and chills.  HENT: Positive for congestion. Negative for ear discharge, sore throat and trouble swallowing.   Respiratory: Positive for cough. Negative for shortness of breath.   Genitourinary: Negative.   Neurological: Negative.   Psychiatric/Behavioral: Negative.   All other systems reviewed and are negative.      Objective:   Physical Exam  Constitutional: She is oriented to person, place, and time. She appears well-developed and well-nourished. No distress.  HENT:  Right Ear: Hearing, tympanic membrane, external ear and ear canal normal.  Left Ear: Hearing, tympanic membrane, external ear and ear canal normal.  Nose: Mucosal edema and rhinorrhea present. Right sinus exhibits no maxillary sinus tenderness and no frontal sinus tenderness. Left sinus exhibits no maxillary sinus tenderness and no frontal sinus tenderness.  Mouth/Throat: Uvula is midline, oropharynx is clear and moist and mucous membranes are normal.  Cardiovascular: Normal rate, regular rhythm and normal heart sounds.   Pulmonary/Chest: Effort normal and breath sounds normal.  Neurological: She is alert and oriented to person, place, and time.  Skin: Skin is warm.  Psychiatric: She has a normal mood and affect. Her behavior is normal. Judgment and thought content normal.    BP 120/73 mmHg  Pulse 100  Temp(Src) 101.6 F (38.7 C) (Oral)  Ht 5\' 7"  (1.702 m)  Wt 163 lb (73.936 kg)  BMI 25.52 kg/m2  LMP 10/30/2015 (Approximate) Flu a/B- negative Strep negative      Assessment & Plan:  1. Body aches - Veritor Flu A/B Waived - Rapid strep screen  (not at [facility])  2. Upper respiratory infection with cough and congestion 1. Take meds as prescribed 2. Use a cool mist humidifier especially during the winter months and when heat has been humid. 3. Use saline nose sprays frequently 4. Saline irrigations of the nose can be very helpful if done frequently.  * 4X daily for 1 week*  * Use of a nettie pot can be helpful with this. Follow directions with this* 5. Drink plenty of fluids 6. Keep thermostat turn down low 7.For any cough or congestion  Use plain Mucinex- regular strength or max strength is fine   * Children- consult with Pharmacist for dosing 8. For fever or aces or pains- take tylenol or ibuprofen appropriate for age and weight.  * for fevers greater than 101 orally you may alternate ibuprofen and tylenol every  3 hours.   Continue zithromax as rx Alternate motrin and tylenol every 3 hours for fever RTO prn  Mary-Margaret Hassell Done, FNP

## 2015-12-11 ENCOUNTER — Telehealth: Payer: Self-pay | Admitting: Family

## 2015-12-11 DIAGNOSIS — Z8659 Personal history of other mental and behavioral disorders: Secondary | ICD-10-CM

## 2015-12-11 DIAGNOSIS — F411 Generalized anxiety disorder: Secondary | ICD-10-CM

## 2015-12-11 MED ORDER — SERTRALINE HCL 50 MG PO TABS: 50.0000 mg | ORAL_TABLET | Freq: Every day | ORAL | Status: DC

## 2015-12-11 MED FILL — SERTRALINE HCL 50 MG TABLET: 50 | 90 days supply | Qty: 90 | Fill #0

## 2015-12-11 NOTE — Telephone Encounter (Signed)
Pt is aware.  

## 2015-12-11 NOTE — Telephone Encounter (Signed)
Prescription sent to pharmacy.

## 2015-12-20 MED FILL — ALPRAZolam 0.5 MG TABS: 0.5 | 30 days supply | Qty: 60 | Fill #1

## 2015-12-20 MED FILL — PANTOPRAZOLE SOD DR 20 MG T: 20 | 90 days supply | Qty: 90 | Fill #1

## 2016-01-12 ENCOUNTER — Ambulatory Visit (INDEPENDENT_AMBULATORY_CARE_PROVIDER_SITE_OTHER): Payer: 59 | Admitting: Nurse Practitioner

## 2016-01-12 ENCOUNTER — Encounter: Payer: Self-pay | Admitting: Nurse Practitioner

## 2016-01-12 VITALS — BP 95/63 | HR 81 | Temp 97.6°F | Ht 67.0 in | Wt 163.0 lb

## 2016-01-12 DIAGNOSIS — M545 Low back pain: Secondary | ICD-10-CM | POA: Diagnosis not present

## 2016-01-12 LAB — URINALYSIS, COMPLETE
Bilirubin, UA: NEGATIVE
Glucose, UA: NEGATIVE
Leukocytes, UA: NEGATIVE
NITRITE UA: NEGATIVE
Protein, UA: NEGATIVE
SPEC GRAV UA: 1.025 (ref 1.005–1.030)
Urobilinogen, Ur: 1 mg/dL (ref 0.2–1.0)
pH, UA: 6 (ref 5.0–7.5)

## 2016-01-12 LAB — MICROSCOPIC EXAMINATION

## 2016-01-12 NOTE — Patient Instructions (Signed)

## 2016-01-12 NOTE — Progress Notes (Signed)
   Subjective:    Patient ID: Katherine Hays, female    DOB: [birth year], 45 y.o.   MRN: [id number]  HPI Patoent in today c/o left lower back pain- last time she had this pain she had a UTI that was positive with culture- back started hurting 2-3 days ago but is worsening today- pain has been constant today- rates pain 2/10. Nothing seems to increase pain or make it better. SHe does have urinary frequency and urgency.    Review of Systems  Constitutional: Negative.   HENT: Negative.   Respiratory: Negative.   Cardiovascular: Negative.   Gastrointestinal: Negative for nausea, vomiting, abdominal pain, diarrhea and constipation.  Genitourinary: Positive for dysuria, urgency and frequency.  Neurological: Negative.   Psychiatric/Behavioral: Negative.   All other systems reviewed and are negative.      Objective:   Physical Exam  Constitutional: She is oriented to person, place, and time. She appears well-developed and well-nourished. No distress.  Cardiovascular: Normal rate, regular rhythm and normal heart sounds.   Pulmonary/Chest: Effort normal and breath sounds normal.  Abdominal: Soft. Bowel sounds are normal. There is no tenderness.  Genitourinary:  No CVA tenderness  Neurological: She is alert and oriented to person, place, and time.  Skin: Skin is warm.  Psychiatric: She has a normal mood and affect. Her behavior is normal. Judgment and thought content normal.    BP 95/63 mmHg  Pulse 81  Temp(Src) 97.6 F (36.4 C) (Oral)  Ht 5\' 7"  (1.702 m)  Wt 163 lb (73.936 kg)  BMI 25.52 kg/m2  Urine clear     Assessment & Plan:   1. Low back pain, unspecified back pain laterality, with sciatica presence unspecified    Motrin OTC Moist heat Rest  rto prn  Mary-Margaret Hassell Done, FNP

## 2016-01-18 ENCOUNTER — Other Ambulatory Visit: Payer: Self-pay

## 2016-01-18 DIAGNOSIS — L814 Other melanin hyperpigmentation: Secondary | ICD-10-CM | POA: Diagnosis not present

## 2016-01-18 DIAGNOSIS — D485 Neoplasm of uncertain behavior of skin: Secondary | ICD-10-CM | POA: Diagnosis not present

## 2016-01-18 DIAGNOSIS — D239 Other benign neoplasm of skin, unspecified: Secondary | ICD-10-CM | POA: Diagnosis not present

## 2016-01-19 MED FILL — VANIQA 13.9% CREAM: 13.9 | 90 days supply | Qty: 45 | Fill #0

## 2016-01-19 MED FILL — TRI-LUMA CREAM: 0.01-4-0.05 | 30 days supply | Qty: 30 | Fill #0

## 2016-02-14 ENCOUNTER — Ambulatory Visit (INDEPENDENT_AMBULATORY_CARE_PROVIDER_SITE_OTHER): Payer: 59 | Admitting: Physician Assistant

## 2016-02-14 ENCOUNTER — Encounter: Payer: Self-pay | Admitting: Physician Assistant

## 2016-02-14 VITALS — BP 107/75 | HR 75 | Temp 97.3°F | Ht 67.0 in | Wt 166.8 lb

## 2016-02-14 DIAGNOSIS — S60922A Unspecified superficial injury of left hand, initial encounter: Secondary | ICD-10-CM | POA: Diagnosis not present

## 2016-02-14 DIAGNOSIS — S60921A Unspecified superficial injury of right hand, initial encounter: Secondary | ICD-10-CM | POA: Diagnosis not present

## 2016-02-14 DIAGNOSIS — W5501XA Bitten by cat, initial encounter: Secondary | ICD-10-CM

## 2016-02-14 DIAGNOSIS — Z23 Encounter for immunization: Secondary | ICD-10-CM

## 2016-02-14 MED ORDER — TETANUS-DIPHTH-ACELL PERTUSSIS 5-2.5-18.5 LF-MCG/0.5 IM SUSP: 0.5000 mL | Freq: Once | INTRAMUSCULAR | Status: DC

## 2016-02-14 MED ORDER — AMOXICILLIN-POT CLAVULANATE 875-125 MG PO TABS: 1.0000 | ORAL_TABLET | Freq: Two times a day (BID) | ORAL | Status: DC

## 2016-02-14 NOTE — Patient Instructions (Signed)

## 2016-02-14 NOTE — Progress Notes (Signed)
Subjective:     Patient ID: Katherine Hays, female   DOB: 24-Oct-1970, 45 y.o.   MRN: [id number]  HPI Pt with new kittens at home Several bites to both hands from playing with kittens  Review of Systems No pain, redness, swelling, or drainage from the bites    Objective:   Physical Exam Multiple bites to the fingers of both hands No surrounding erythema, edema, induration, or drainage noted to any of the bites FROM of all fingers    Assessment:     1. Cat bite, initial encounter        Plan:     Pt will pick up Augmentin if any S/S of infection Tetanus updated today Wound care reviewed F/U prn

## 2016-02-16 ENCOUNTER — Ambulatory Visit: Payer: 59 | Admitting: Family

## 2016-03-04 MED FILL — ALPRAZolam 0.5 MG TABS: 0.5 | 30 days supply | Qty: 60 | Fill #2

## 2016-03-04 MED FILL — SERTRALINE HCL 50 MG TABLET: 50 | 90 days supply | Qty: 90 | Fill #1

## 2016-03-20 ENCOUNTER — Other Ambulatory Visit: Payer: Self-pay

## 2016-03-20 ENCOUNTER — Other Ambulatory Visit: Payer: Self-pay | Admitting: Family

## 2016-03-20 ENCOUNTER — Other Ambulatory Visit: Payer: 59

## 2016-03-20 ENCOUNTER — Ambulatory Visit: Payer: 59

## 2016-03-20 DIAGNOSIS — R300 Dysuria: Secondary | ICD-10-CM | POA: Diagnosis not present

## 2016-03-20 LAB — URINALYSIS, COMPLETE
Bilirubin, UA: NEGATIVE
Glucose, UA: NEGATIVE
Ketones, UA: NEGATIVE
Nitrite, UA: NEGATIVE
PH UA: 5.5 (ref 5.0–7.5)
Protein, UA: NEGATIVE
Specific Gravity, UA: 1.02 (ref 1.005–1.030)
Urobilinogen, Ur: 0.2 mg/dL (ref 0.2–1.0)

## 2016-03-20 LAB — MICROSCOPIC EXAMINATION

## 2016-03-20 MED ORDER — SULFAMETHOXAZOLE-TRIMETHOPRIM 800-160 MG PO TABS: 1.0000 | ORAL_TABLET | Freq: Two times a day (BID) | ORAL | 0 refills | Status: DC

## 2016-03-25 ENCOUNTER — Other Ambulatory Visit: Payer: Self-pay

## 2016-03-25 DIAGNOSIS — D485 Neoplasm of uncertain behavior of skin: Secondary | ICD-10-CM | POA: Diagnosis not present

## 2016-03-25 DIAGNOSIS — D239 Other benign neoplasm of skin, unspecified: Secondary | ICD-10-CM | POA: Diagnosis not present

## 2016-03-25 DIAGNOSIS — L281 Prurigo nodularis: Secondary | ICD-10-CM | POA: Diagnosis not present

## 2016-03-28 ENCOUNTER — Other Ambulatory Visit: Payer: Self-pay

## 2016-03-28 ENCOUNTER — Other Ambulatory Visit: Payer: Self-pay | Admitting: Family

## 2016-03-28 ENCOUNTER — Other Ambulatory Visit: Payer: 59

## 2016-03-28 DIAGNOSIS — M545 Low back pain: Secondary | ICD-10-CM

## 2016-03-28 DIAGNOSIS — R319 Hematuria, unspecified: Secondary | ICD-10-CM

## 2016-03-28 LAB — URINALYSIS, COMPLETE
BILIRUBIN UA: NEGATIVE
Glucose, UA: NEGATIVE
NITRITE UA: NEGATIVE
PH UA: 6 (ref 5.0–7.5)
Protein, UA: NEGATIVE
Specific Gravity, UA: 1.02 (ref 1.005–1.030)
UUROB: 0.2 mg/dL (ref 0.2–1.0)

## 2016-03-28 LAB — MICROSCOPIC EXAMINATION

## 2016-03-28 MED ORDER — CIPROFLOXACIN HCL 500 MG PO TABS: 500.0000 mg | ORAL_TABLET | Freq: Two times a day (BID) | ORAL | 0 refills | Status: DC

## 2016-03-28 NOTE — Progress Notes (Signed)
Cipro Prescription sent to pharmacy   

## 2016-03-29 LAB — URINE CULTURE: ORGANISM ID, BACTERIA: NO GROWTH

## 2016-05-01 ENCOUNTER — Other Ambulatory Visit: Payer: Self-pay | Admitting: *Deleted

## 2016-05-01 DIAGNOSIS — F411 Generalized anxiety disorder: Secondary | ICD-10-CM

## 2016-05-01 DIAGNOSIS — G4700 Insomnia, unspecified: Secondary | ICD-10-CM

## 2016-05-02 MED ORDER — ALPRAZOLAM 0.5 MG PO TABS: 0.5000 mg | ORAL_TABLET | Freq: Two times a day (BID) | ORAL | 3 refills | Status: DC

## 2016-05-02 MED FILL — ALPRAZolam 0.5 MG TABS: 0.5 | 30 days supply | Qty: 60 | Fill #0

## 2016-05-02 NOTE — Telephone Encounter (Signed)
Rx called into pharmacy and pt is aware. 

## 2016-05-25 DIAGNOSIS — H5213 Myopia, bilateral: Secondary | ICD-10-CM | POA: Diagnosis not present

## 2016-05-25 DIAGNOSIS — H52223 Regular astigmatism, bilateral: Secondary | ICD-10-CM | POA: Diagnosis not present

## 2016-05-25 DIAGNOSIS — H524 Presbyopia: Secondary | ICD-10-CM | POA: Diagnosis not present

## 2016-06-07 ENCOUNTER — Encounter: Payer: Self-pay | Admitting: Physician Assistant

## 2016-06-07 ENCOUNTER — Ambulatory Visit (INDEPENDENT_AMBULATORY_CARE_PROVIDER_SITE_OTHER): Payer: 59 | Admitting: Physician Assistant

## 2016-06-07 DIAGNOSIS — Z8659 Personal history of other mental and behavioral disorders: Secondary | ICD-10-CM

## 2016-06-07 DIAGNOSIS — F411 Generalized anxiety disorder: Secondary | ICD-10-CM

## 2016-06-07 MED ORDER — SERTRALINE HCL 50 MG PO TABS: 50.0000 mg | ORAL_TABLET | Freq: Every day | ORAL | 3 refills | Status: DC

## 2016-06-07 MED FILL — SERTRALINE HCL 50 MG TABLET: 50 | 90 days supply | Qty: 90 | Fill #0

## 2016-06-07 NOTE — Patient Instructions (Addendum)
Major Depressive Disorder Major depressive disorder is a mental illness. It also may be called clinical depression or unipolar depression. Major depressive disorder usually causes feelings of sadness, hopelessness, or helplessness. Some people with this disorder do not feel particularly sad but lose interest in doing things they used to enjoy (anhedonia). Major depressive disorder also can cause physical symptoms. It can interfere with work, school, relationships, and other normal everyday activities. The disorder varies in severity but is longer lasting and more serious than the sadness we all feel from time to time in our lives. Major depressive disorder often is triggered by stressful life events or major life changes. Examples of these triggers include divorce, loss of your job or home, a move, and the death of a family member or close friend. Sometimes this disorder occurs for no obvious reason at all. People who have family members with major depressive disorder or bipolar disorder are at higher risk for developing this disorder, with or without life stressors. Major depressive disorder can occur at any age. It may occur just once in your life (single episode major depressive disorder). It may occur multiple times (recurrent major depressive disorder). SYMPTOMS People with major depressive disorder have either anhedonia or depressed mood on nearly a daily basis for at least 2 weeks or longer. Symptoms of depressed mood include:  Feelings of sadness (blue or down in the dumps) or emptiness.  Feelings of hopelessness or helplessness.  Tearfulness or episodes of crying (may be observed by others).  Irritability (children and adolescents). In addition to depressed mood or anhedonia or both, people with this disorder have at least four of the following symptoms:  Difficulty sleeping or sleeping too much.   Significant change (increase or decrease) in appetite or weight.   Lack of energy or  motivation.  Feelings of guilt and worthlessness.   Difficulty concentrating, remembering, or making decisions.  Unusually slow movement (psychomotor retardation) or restlessness (as observed by others).   Recurrent wishes for death, recurrent thoughts of self-harm (suicide), or a suicide attempt. People with major depressive disorder commonly have persistent negative thoughts about themselves, other people, and the world. People with severe major depressive disorder may experiencedistorted beliefs or perceptions about the world (psychotic delusions). They also may see or hear things that are not real (psychotic hallucinations). DIAGNOSIS Major depressive disorder is diagnosed through an assessment by your health care provider. Your health care provider will ask aboutaspects of your daily life, such as mood,sleep, and appetite, to see if you have the diagnostic symptoms of major depressive disorder. Your health care provider may ask about your medical history and use of alcohol or drugs, including prescription medicines. Your health care provider also may do a physical exam and blood work. This is because certain medical conditions and the use of certain substances can cause major depressive disorder-like symptoms (secondary depression). Your health care provider also may refer you to a mental health specialist for further evaluation and treatment. TREATMENT It is important to recognize the symptoms of major depressive disorder and seek treatment. The following treatments can be prescribed for this disorder:   Medicine. Antidepressant medicines usually are prescribed. Antidepressant medicines are thought to correct chemical imbalances in the brain that are commonly associated with major depressive disorder. Other types of medicine may be added if the symptoms do not respond to antidepressant medicines alone or if psychotic delusions or hallucinations occur.  Talk therapy. Talk therapy can be  helpful in treating major depressive disorder by providing   support, education, and guidance. Certain types of talk therapy also can help with negative thinking (cognitive behavioral therapy) and with relationship issues that trigger this disorder (interpersonal therapy). A mental health specialist can help determine which treatment is best for you. Most people with major depressive disorder do well with a combination of medicine and talk therapy. Treatments involving electrical stimulation of the brain can be used in situations with extremely severe symptoms or when medicine and talk therapy do not work over time. These treatments include electroconvulsive therapy, transcranial magnetic stimulation, and vagal nerve stimulation.   This information is not intended to replace advice given to you by your health care provider. Make sure you discuss any questions you have with your health care provider.   Document Released: 11/30/2012 Document Revised: 08/26/2014 Document Reviewed: 11/30/2012 Elsevier Interactive Patient Education 2016 [employer]  

## 2016-06-10 NOTE — Progress Notes (Signed)
BP 102/65   Pulse 63   Temp 98.6 F (37 C) (Oral)   Ht 5\' 7"  (1.702 m)   Wt 168 lb 6.4 oz (76.4 kg)   BMI 26.38 kg/m    Subjective:    Patient ID: Katherine Hays, female    DOB: October 03, 1970, 45 y.o.   MRN: [id number]  HPI: Katherine Hays is a 45 y.o. female presenting on 06/07/2016 for No chief complaint on file. This patient has had significant increase in depression symptoms. She reports that she is having very little motivation and wants to stay home all the time. She does not like to go out and do things with her family. She has always been anxious about very crowded spaces but particularly now doesn't want to go out anywhere. She has had long-term treatment with her Zoloft at 50 mg for her obsessive-compulsive thinking. But at this point it is having an increase in that symptomatology also.  Her pHQ9 score is 12.  Depression screen [facility] 2/9 06/07/2016 11/13/2015 09/28/2015  Decreased Interest 2 0 0  Down, Depressed, Hopeless 2 0 0  PHQ - 2 Score 4 0 0  Altered sleeping 3 - -  Tired, decreased energy 3 - -  Change in appetite 0 - -  Feeling bad or failure about yourself  2 - -  Trouble concentrating 0 - -  Moving slowly or fidgety/restless 0 - -  Suicidal thoughts 0 - -  PHQ-9 Score 12 - -      Relevant past medical, surgical, family and social history reviewed and updated as indicated. Allergies and medications reviewed and updated.  Past Medical History:  Diagnosis Date  . Anxiety   . Asthma   . OCD (obsessive compulsive disorder)     Past Surgical History:  Procedure Laterality Date  . TONSILLECTOMY    . TUBAL LIGATION    . WISDOM TOOTH EXTRACTION      Review of Systems  Constitutional: Negative.   HENT: Negative.   Eyes: Negative.   Respiratory: Negative.   Gastrointestinal: Negative.   Genitourinary: Negative.   Psychiatric/Behavioral: Positive for decreased concentration and dysphoric mood. Negative for sleep disturbance and suicidal ideas. The  patient is nervous/anxious. The patient is not hyperactive.       Medication List       Accurate as of 06/07/16 11:59 PM. Always use your most recent med list.          ALPRAZolam 0.5 MG tablet Commonly known as:  XANAX Take 1 tablet (0.5 mg total) by mouth 2 (two) times daily.   levonorgestrel 20 MCG/24HR IUD Commonly known as:  MIRENA 1 each by Intrauterine route once.   pantoprazole 20 MG tablet Commonly known as:  PROTONIX Take 1 tablet (20 mg total) by mouth daily.   sertraline 50 MG tablet Commonly known as:  ZOLOFT Take 1 tablet (50 mg total) by mouth daily.   TRI-LUMA 0.01-4-0.05 % Crea Generic drug:  Fluocin-Hydroquinone-Tretinoin   VANIQA 13.9 % cream Generic drug:  Eflornithine HCl          Objective:    BP 102/65   Pulse 63   Temp 98.6 F (37 C) (Oral)   Ht 5\' 7"  (1.702 m)   Wt 168 lb 6.4 oz (76.4 kg)   BMI 26.38 kg/m   Allergies  Allergen Reactions  . Codeine     Physical Exam  Constitutional: She is oriented to person, place, and time. She appears well-developed and well-nourished.  HENT:  Head: Normocephalic and atraumatic.  Eyes: Conjunctivae and EOM are normal. Pupils are equal, round, and reactive to light.  Cardiovascular: Normal rate, regular rhythm, normal heart sounds and intact distal pulses.   Pulmonary/Chest: Effort normal and breath sounds normal.  Abdominal: Soft. Bowel sounds are normal.  Neurological: She is alert and oriented to person, place, and time. She has normal reflexes.  Skin: Skin is warm and dry. No rash noted.  Psychiatric: Her speech is normal and behavior is normal. Judgment and thought content normal. Her mood appears anxious. She exhibits a depressed mood. She expresses no suicidal plans and no homicidal plans.  Nursing note and vitals reviewed.       Assessment & Plan:   1. GAD (generalized anxiety disorder) - sertraline (ZOLOFT) 50 MG tablet; Take 1 tablet (50 mg total) by mouth daily.  Dispense: 90  tablet; Refill: 3  2. History of OCD (obsessive compulsive disorder) - sertraline (ZOLOFT) 50 MG tablet; Take 1 tablet (50 mg total) by mouth daily.  Dispense: 90 tablet; Refill: 3   Continue all other maintenance medications as listed above.  Follow up plan: Return in about 4 weeks (around 07/05/2016).  No orders of the defined types were placed in this encounter.   Educational handout given for depression  Terald Sleeper PA-C [facility] [address]  [place], [place] [address] [phone]   06/10/2016, 11:19 AM

## 2016-06-12 ENCOUNTER — Telehealth: Payer: Self-pay | Admitting: Family

## 2016-06-12 DIAGNOSIS — Z8659 Personal history of other mental and behavioral disorders: Secondary | ICD-10-CM

## 2016-06-12 DIAGNOSIS — F411 Generalized anxiety disorder: Secondary | ICD-10-CM

## 2016-06-12 MED ORDER — SERTRALINE HCL 100 MG PO TABS: 100.0000 mg | ORAL_TABLET | Freq: Every day | ORAL | 11 refills | Status: DC

## 2016-06-12 NOTE — Telephone Encounter (Signed)
Med sent, patient aware 

## 2016-06-27 ENCOUNTER — Ambulatory Visit (INDEPENDENT_AMBULATORY_CARE_PROVIDER_SITE_OTHER): Payer: 59 | Admitting: Family Medicine

## 2016-06-27 ENCOUNTER — Encounter: Payer: Self-pay | Admitting: Family Medicine

## 2016-06-27 VITALS — BP 116/77 | HR 80 | Temp 97.5°F | Ht 67.0 in | Wt 165.0 lb

## 2016-06-27 DIAGNOSIS — M2560 Stiffness of unspecified joint, not elsewhere classified: Secondary | ICD-10-CM

## 2016-06-27 DIAGNOSIS — L719 Rosacea, unspecified: Secondary | ICD-10-CM

## 2016-06-27 MED ORDER — METRONIDAZOLE 1 % EX GEL: Freq: Every day | CUTANEOUS | 0 refills | Status: DC

## 2016-06-27 MED FILL — metroNIDAZOLE 1 % GEL: 1 | 30 days supply | Qty: 60 | Fill #0

## 2016-06-27 NOTE — Progress Notes (Signed)
   HPI  Patient presents today here with rash and joint pains.  Patient has had a rash on her face 2 months. She's struggled to control it. She's had some improvement transiently with Abreva and with mild facial soaps. She states that after the breathing has been burning a little bit.  She is also concerned about rheumatoid arthritis with multiple joint pains and stiffness. Her stiffness lasts less than 5 minutes. She is joint pain in the right hip, bilateral hands, and low back. She has several family members that are positive for rheumatoid arthritis.   PMH: Smoking status noted ROS: Per HPI  Objective: BP 116/77   Pulse 80   Temp 97.5 F (36.4 C) (Oral)   Ht 5\' 7"  (1.702 m)   Wt 165 lb (74.8 kg)   BMI 25.84 kg/m  Gen: NAD, alert, cooperative with exam HEENT: NCAT CV: RRR, good S1/S2, no murmur Resp: CTABL, no wheezes, non-labored Ext: No edema, warm Neuro: Alert and oriented, No gross deficits  Skin:  5-10 papules on bilateral nasolabial folds, no pustules  Assessment and plan:  # Rosacea I think the rash is most consistent with rosacea Trial of metronidazole gel  # Joint stiffness and pains Multiple joint pains and joint stiffness. Although joint stiffness is not sound as significant as is typical with inflammatory arthritis we'll go ahead and rule out rheumatoid arthritis with a sedimentation rate and ANA panel.     Orders Placed This Encounter  Procedures  . Sedimentation rate  . ANA Comprehensive Panel    Meds ordered this encounter  Medications  . metroNIDAZOLE (METROGEL) 1 % gel    Sig: Apply topically daily.    Dispense:  60 g    Refill:  [place], MD [facility] 06/27/2016, 6:48 PM

## 2016-06-27 NOTE — Patient Instructions (Signed)
Try metronidazole gel once daily for the rash  I will let you know the labs as soon as they are available.

## 2016-06-28 LAB — ANA COMPREHENSIVE PANEL
ENA RNP Ab: <0.2 AI (ref 0.0–0.9)
ENA SM Ab Ser-aCnc: <0.2 AI (ref 0.0–0.9)
Scleroderma SCL-70: <0.2 AI (ref 0.0–0.9)
dsDNA Ab: <1 IU/mL (ref 0–9)

## 2016-06-28 LAB — SEDIMENTATION RATE: SED RATE: 2 mm/h (ref 0–32)

## 2016-07-12 ENCOUNTER — Ambulatory Visit (INDEPENDENT_AMBULATORY_CARE_PROVIDER_SITE_OTHER): Payer: 59 | Admitting: Family Medicine

## 2016-07-12 ENCOUNTER — Encounter: Payer: Self-pay | Admitting: Family Medicine

## 2016-07-12 VITALS — BP 105/73 | HR 71 | Temp 97.8°F | Ht 67.0 in | Wt 168.8 lb

## 2016-07-12 DIAGNOSIS — S96812A Strain of other specified muscles and tendons at ankle and foot level, left foot, initial encounter: Secondary | ICD-10-CM

## 2016-07-12 NOTE — Patient Instructions (Signed)
1. Eccentric heel drop description a. Stand with the heel of the affected foot beyond the edge of the step or platform with the foot plantar flexed. Slowly lower the heel, bringing the foot into dorsiflexion. b. Perform the exercise both with the knee straight (gastrocnemius) and with the knee bent 45 degrees (soleus). c. Avoid concentric exercise by raising the foot back to the plantar flexed starting position using the unaffected foot, and hands if a railing is available. 2. Number of exercises a. Perform 3 sets of 15 repetitions with straight knees, then 3 sets of 15 repetitions with bent knees. b. Perform this cycle twice daily (180 drops/day). c. Continue the program 7 days per week for 12 to 24 weeks. 3. Exercise progression a. The exercises must be uncomfortable; when heel drops are no longer painful add weight by using a backpack, dumbbells, or a weight machine.

## 2016-07-12 NOTE — Progress Notes (Signed)
BP 105/73   Pulse 71   Temp 97.8 F (36.6 C) (Oral)   Ht 5\' 7"  (1.702 m)   Wt 168 lb 12.8 oz (76.6 kg)   BMI 26.44 kg/m    Subjective:    Patient ID: Katherine Hays, female    DOB: 1971-07-24, 45 y.o.   MRN: [id number]  HPI: AVY SIMONYAN is a 45 y.o. female presenting on 07/12/2016 for torn muscle in leg?   HPI Torn muscle in leg  Patient was walking yesterday and felt a pop in the back of her left leg and since then she has had a knot there and painful walking. She still has strength and is able to walk but just has a lot of pain and tenderness in the back of her calf when she walks. She denies any redness or warmth but there is a swelling or not in the back of her mid calf. There are no overlying skin changes. She denies any numbness or weakness or change of color in her feet or toes. She has not had any long travels or trips.  Relevant past medical, surgical, family and social history reviewed and updated as indicated. Interim medical history since our last visit reviewed. Allergies and medications reviewed and updated.  Review of Systems  Constitutional: Negative for chills and fever.  HENT: Negative for congestion, ear discharge and ear pain.   Respiratory: Negative for chest tightness and shortness of breath.   Cardiovascular: Negative for chest pain and leg swelling.  Musculoskeletal: Positive for myalgias. Negative for arthralgias, back pain, gait problem and joint swelling.  Skin: Negative for rash.  Neurological: Negative for light-headedness and headaches.  Psychiatric/Behavioral: Negative for agitation and behavioral problems.  All other systems reviewed and are negative.   Per HPI unless specifically indicated above      Objective:    BP 105/73   Pulse 71   Temp 97.8 F (36.6 C) (Oral)   Ht 5\' 7"  (1.702 m)   Wt 168 lb 12.8 oz (76.6 kg)   BMI 26.44 kg/m   Wt Readings from Last 3 Encounters:  07/12/16 168 lb 12.8 oz (76.6 kg)  06/27/16 165  lb (74.8 kg)  06/07/16 168 lb 6.4 oz (76.4 kg)    Physical Exam  Constitutional: She is oriented to person, place, and time. She appears well-developed and well-nourished. No distress.  Eyes: Conjunctivae are normal.  Pulmonary/Chest: Breath sounds normal.  Musculoskeletal: Normal range of motion. She exhibits no edema.       Left lower leg: She exhibits tenderness and swelling. She exhibits no bony tenderness.       Legs: Neurological: She is alert and oriented to person, place, and time. Coordination normal.  Skin: Skin is warm and dry. No ecchymosis and no rash noted. She is not diaphoretic. No erythema.  Psychiatric: She has a normal mood and affect. Her behavior is normal.  Nursing note and vitals reviewed.     Assessment & Plan:   Problem List Items Addressed This Visit    None    Visit Diagnoses    Rupture of left plantaris tendon, initial encounter    -  Primary   Recommend ice and heat and anti-inflammatories and rest and slowly building up exercise over the next 4 weeks   Relevant Orders   DME Crutches       Follow up plan: Return if symptoms worsen or fail to improve.  Counseling provided for all of the vaccine components Orders  Placed This Encounter  Procedures  . DME Crutches    Caryl Pina, MD [facility] 07/12/2016, 10:03 AM

## 2016-08-30 ENCOUNTER — Ambulatory Visit (INDEPENDENT_AMBULATORY_CARE_PROVIDER_SITE_OTHER): Payer: 59 | Admitting: Physician Assistant

## 2016-08-30 ENCOUNTER — Encounter: Payer: Self-pay | Admitting: Physician Assistant

## 2016-08-30 VITALS — BP 101/63 | HR 67 | Temp 97.0°F | Ht 67.0 in | Wt 169.6 lb

## 2016-08-30 DIAGNOSIS — F5101 Primary insomnia: Secondary | ICD-10-CM | POA: Diagnosis not present

## 2016-08-30 DIAGNOSIS — F411 Generalized anxiety disorder: Secondary | ICD-10-CM | POA: Diagnosis not present

## 2016-08-30 DIAGNOSIS — Z8659 Personal history of other mental and behavioral disorders: Secondary | ICD-10-CM

## 2016-08-30 MED ORDER — ALPRAZOLAM 0.5 MG PO TABS: 0.5000 mg | ORAL_TABLET | Freq: Two times a day (BID) | ORAL | 3 refills | Status: DC

## 2016-08-30 MED ORDER — SERTRALINE HCL 100 MG PO TABS: 100.0000 mg | ORAL_TABLET | Freq: Every day | ORAL | 11 refills | Status: DC

## 2016-08-30 MED FILL — SERTRALINE HCL 100 MG TAB: 100 | 30 days supply | Qty: 30 | Fill #0

## 2016-08-30 NOTE — Progress Notes (Signed)
BP 101/63   Pulse 67   Temp 97 F (36.1 C) (Oral)   Ht 5\' 7"  (1.702 m)   Wt 169 lb 9.6 oz (76.9 kg)   BMI 26.56 kg/m    Subjective:    Patient ID: Katherine Hays, female    DOB: [birth year], 46 y.o.   MRN: [id number]  HPI: Katherine Hays is a 46 y.o. female presenting on 08/30/2016 for Medication Refill  This patient comes in for periodic recheck on medications and conditions. All medications are reviewed today. There are no reports of any problems with the medications. All of the medical conditions are reviewed and updated.  Lab work is reviewed and will be ordered as medically necessary. There are no new problems reported with today's visit. Patient reports that the medication is doing very well. She has not had any problems with the Zoloft at 100 mg. She has had great relief of her anxiety and obsessive-compulsive symptoms. She needs refill sent in today. She also takes the alprazolam on a very as-needed basis.  Past Medical History:  Diagnosis Date  . Anxiety   . Asthma   . OCD (obsessive compulsive disorder)    Relevant past medical, surgical, family and social history reviewed and updated as indicated. Interim medical history since our last visit reviewed. Allergies and medications reviewed and updated. DATA REVIEWED: CHART IN EPIC  Social History   Social History  . Marital status: Married    Spouse name: N/A  . Number of children: 3  . Years of education: N/A   Occupational History  . [employer]   Social History Main Topics  . Smoking status: Never Smoker  . Smokeless tobacco: Never Used  . Alcohol use No  . Drug use: No  . Sexual activity: Yes   Other Topics Concern  . Not on file   Social History Narrative  . No narrative on file    Past Surgical History:  Procedure Laterality Date  . TONSILLECTOMY    . TUBAL LIGATION    . WISDOM TOOTH EXTRACTION      Family History  Problem Relation Age of Onset  . Liver disease Father       cirrhosis  . Pancreatic cancer Maternal Uncle   . Diabetes Maternal Grandfather   . Stroke Maternal Grandfather   . Breast cancer Paternal Grandmother   . Cancer Paternal Grandmother     breast  . Heart disease Paternal Grandfather   . Stroke Paternal Grandfather   . Hyperthyroidism Mother   . Hypertension Brother   . Crohn's disease      Nephew    Review of Systems  Constitutional: Negative.   HENT: Negative.   Eyes: Negative.   Respiratory: Negative.   Gastrointestinal: Negative.   Genitourinary: Negative.   Psychiatric/Behavioral: Negative.  Negative for decreased concentration and dysphoric mood. The patient is not nervous/anxious.     Allergies as of 08/30/2016      Reactions   Codeine       Medication List       Accurate as of 08/30/16 12:17 PM. Always use your most recent med list.          ALPRAZolam 0.5 MG tablet Commonly known as:  XANAX Take 1 tablet (0.5 mg total) by mouth 2 (two) times daily.   levonorgestrel 20 MCG/24HR IUD Commonly known as:  MIRENA 1 each by Intrauterine route once.   pantoprazole 20 MG tablet Commonly known as:  PROTONIX Take  1 tablet (20 mg total) by mouth daily.   sertraline 100 MG tablet Commonly known as:  ZOLOFT Take 1 tablet (100 mg total) by mouth daily.          Objective:    BP 101/63   Pulse 67   Temp 97 F (36.1 C) (Oral)   Ht 5\' 7"  (1.702 m)   Wt 169 lb 9.6 oz (76.9 kg)   BMI 26.56 kg/m   Allergies  Allergen Reactions  . Codeine     Wt Readings from Last 3 Encounters:  08/30/16 169 lb 9.6 oz (76.9 kg)  07/12/16 168 lb 12.8 oz (76.6 kg)  06/27/16 165 lb (74.8 kg)    Physical Exam  Constitutional: She is oriented to person, place, and time. She appears well-developed and well-nourished.  HENT:  Head: Normocephalic and atraumatic.  Right Ear: Tympanic membrane, external ear and ear canal normal.  Left Ear: Tympanic membrane, external ear and ear canal normal.  Nose: Nose normal. No  rhinorrhea.  Mouth/Throat: Oropharynx is clear and moist and mucous membranes are normal. No oropharyngeal exudate or posterior oropharyngeal erythema.  Eyes: Conjunctivae and EOM are normal. Pupils are equal, round, and reactive to light.  Neck: Normal range of motion. Neck supple.  Cardiovascular: Normal rate, regular rhythm, normal heart sounds and intact distal pulses.   Pulmonary/Chest: Effort normal and breath sounds normal.  Abdominal: Soft. Bowel sounds are normal.  Neurological: She is alert and oriented to person, place, and time. She has normal reflexes.  Skin: Skin is warm and dry. No rash noted.  Psychiatric: She has a normal mood and affect. Her behavior is normal. Judgment and thought content normal.  Nursing note and vitals reviewed.       Assessment & Plan:   1. GAD (generalized anxiety disorder) - ALPRAZolam (XANAX) 0.5 MG tablet; Take 1 tablet (0.5 mg total) by mouth 2 (two) times daily.  Dispense: 60 tablet; Refill: 3 - sertraline (ZOLOFT) 100 MG tablet; Take 1 tablet (100 mg total) by mouth daily.  Dispense: 30 tablet; Refill: 11  2. Primary insomnia - ALPRAZolam (XANAX) 0.5 MG tablet; Take 1 tablet (0.5 mg total) by mouth 2 (two) times daily.  Dispense: 60 tablet; Refill: 3  3. History of OCD (obsessive compulsive disorder) - sertraline (ZOLOFT) 100 MG tablet; Take 1 tablet (100 mg total) by mouth daily.  Dispense: 30 tablet; Refill: 11   Continue all other maintenance medications as listed above.  Follow up plan: Return in about 6 months (around 02/27/2017) for recheck.  No orders of the defined types were placed in this encounter.   Educational handout given for [place] PA-C [facility] [address]  [place], [place] [address] [phone]   08/30/2016, 12:17 PM

## 2016-08-30 NOTE — Patient Instructions (Signed)
Obsessive-Compulsive Disorder Obsessive-compulsive disorder (OCD) is a brain-based anxiety disorder. People with OCD have obsessions, compulsions, or both. Obsessions are unwanted and distressing thoughts, ideas, or urges that keep entering your mind and result in anxiety. You may find yourself trying to ignore them. You may try to stop or undo them with a compulsion. Compulsions are repetitive physical or mental acts that you feel you have to do. They may reduce or prevent any emotional distress, but in most instances, they are ineffective. Compulsions can be very time-consuming, often taking more than one hour each day. They can interfere with personal relationships and normal activities at home, school, or work. OCD can begin in childhood, but it usually starts in young adulthood and continues throughout life. Many people with OCD also have depression or another mental health disorder. What are the causes? The cause of this condition is not known. What increases the risk? This condition is more like to develop in:  People who have experienced trauma.  People who have a family history of OCD.  Women during and after pregnancy.  People who have infections and post-infectious autoimmune syndrome.  People who have other mental health conditions.  People who abuse substances. What are the signs or symptoms? Symptoms of OCD include compulsions and obsessions. People with obsessions usually have a fear that something terrible will happen or that they will do something terrible. Examples of common obsessions include:  Fear of contamination with germs, waste, or poisonous substances.  Fear of making the wrong decision.  Violent or sexual thoughts or urges towards others.  Need for symmetry or exactness. Examples of common compulsions include:  Excessive handwashing or bathing due to fear of contamination.  Checking things over and over to make sure you finished a task, such as making sure  you locked a door or unplugged a toaster.  Repeating an act or phrase over and over, sometimes a specific number of times, until it feels right.  Arranging and rearranging objects to keep them in a certain order.  Having a very hard time making a decision and sticking to it. How is this diagnosed? OCD is diagnosed through an assessment by your health care provider. Your health care provider will ask questions about any obsessions or compulsions you have and how they affect your life. Your health care provider may also ask about your medical history, prescription medicines, and drug use. Certain medical conditions and substances can cause symptoms that are similar to OCD. Your health care provider may also refer you to a mental health specialist. How is this treated? Treatment may include:  Cognitive therapy. This is a form of talk therapy. The goal is to identify and change the irrational thoughts associated with obsessions.  Behavioral therapy. A type of behavioral therapy called exposure and response prevention is often used. In this therapy, you will be exposed to the distressing situation that triggers your compulsion and be prevented from responding to it. With repetition of this process over time, you will no longer feel the distress or need to perform the compulsion.  Self-soothing. Meditation, deep breathing, or yoga can help you manage the physiological symptoms of anxiety and can help with how you think.  Medicine. Certain types of antidepressant medicine may help reduce or control OCD symptoms. Medicine is most effective when used with cognitive or behavioral therapy. Treatment usually involves a combination of therapy and medicines. For severe OCD that does not respond to talk therapy and medicine, brain surgery or electrical stimulation of  specific areas of the brain (deep brain stimulation) may be considered. Follow these instructions at home:  Take over-the-counter and  prescription medicines only as told by your health care provider. Do not start taking any new medicines with approval from your health care provider.  Consider joining a support group for people with OCD.  Keep all follow-up visits as told by your health care provider. This is important. Contact a health care provider if:  You are not able to take your medicines as prescribed.  Your symptoms get worse. Get help right away if:  You have suicidal thoughts or thoughts about hurting yourself or others. If you ever feel like you may hurt yourself or others, or have thoughts about taking your own life, get help right away. You can go to your nearest emergency department or call:  Your local emergency services (911 in the [place]  A suicide crisis helpline, such as the [facility] at 2704332259. This is open 24-hours a day. Summary  Obsessive-compulsive disorder (OCD) is a brain-based anxiety disorder. People with OCD have obsessions, compulsions, or both.  OCD can interfere with personal relationships and normal activities at home, school, or work.  Treatment usually involves a combination of therapy and medicines. This information is not intended to replace advice given to you by your health care provider. Make sure you discuss any questions you have with your health care provider. Document Released: 07/30/2001 Document Revised: 05/20/2016 Document Reviewed: 05/20/2016 Elsevier Interactive Patient Education  2017 [employer].

## 2016-09-12 ENCOUNTER — Ambulatory Visit (INDEPENDENT_AMBULATORY_CARE_PROVIDER_SITE_OTHER): Payer: 59 | Admitting: Family Medicine

## 2016-09-12 ENCOUNTER — Encounter: Payer: Self-pay | Admitting: Family Medicine

## 2016-09-12 VITALS — BP 108/70 | HR 91 | Temp 96.5°F | Ht 67.0 in | Wt 168.2 lb

## 2016-09-12 DIAGNOSIS — R05 Cough: Secondary | ICD-10-CM

## 2016-09-12 DIAGNOSIS — N3001 Acute cystitis with hematuria: Secondary | ICD-10-CM

## 2016-09-12 DIAGNOSIS — R350 Frequency of micturition: Secondary | ICD-10-CM | POA: Diagnosis not present

## 2016-09-12 DIAGNOSIS — R059 Cough, unspecified: Secondary | ICD-10-CM

## 2016-09-12 DIAGNOSIS — M545 Low back pain: Secondary | ICD-10-CM | POA: Diagnosis not present

## 2016-09-12 LAB — URINALYSIS, COMPLETE
Bilirubin, UA: NEGATIVE
GLUCOSE, UA: NEGATIVE
Ketones, UA: NEGATIVE
Nitrite, UA: NEGATIVE
PROTEIN UA: NEGATIVE
Specific Gravity, UA: <1.005 — ABNORMAL LOW (ref 1.005–1.030)
UUROB: 0.2 mg/dL (ref 0.2–1.0)
pH, UA: 6.5 (ref 5.0–7.5)

## 2016-09-12 LAB — MICROSCOPIC EXAMINATION
BACTERIA UA: NONE SEEN
Epithelial Cells (non renal): >10 /hpf — AB (ref 0–10)
Renal Epithel, UA: NONE SEEN /hpf

## 2016-09-12 MED ORDER — CEFDINIR 300 MG PO CAPS: 300.0000 mg | ORAL_CAPSULE | Freq: Two times a day (BID) | ORAL | 0 refills | Status: DC

## 2016-09-12 NOTE — Patient Instructions (Signed)
Great to see you!   Urinary Tract Infection, Adult A urinary tract infection (UTI) is an infection of any part of the urinary tract, which includes the kidneys, ureters, bladder, and urethra. These organs make, store, and get rid of urine in the body. UTI can be a bladder infection (cystitis) or kidney infection (pyelonephritis). What are the causes? This infection may be caused by fungi, viruses, or bacteria. Bacteria are the most common cause of UTIs. This condition can also be caused by repeated incomplete emptying of the bladder during urination. What increases the risk? This condition is more likely to develop if:  You ignore your need to urinate or hold urine for long periods of time.  You do not empty your bladder completely during urination.  You wipe back to front after urinating or having a bowel movement, if you are female.  You are uncircumcised, if you are female.  You are constipated.  You have a urinary catheter that stays in place (indwelling).  You have a weak defense (immune) system.  You have a medical condition that affects your bowels, kidneys, or bladder.  You have diabetes.  You take antibiotic medicines frequently or for long periods of time, and the antibiotics no longer work well against certain types of infections (antibiotic resistance).  You take medicines that irritate your urinary tract.  You are exposed to chemicals that irritate your urinary tract.  You are female.  What are the signs or symptoms? Symptoms of this condition include:  Fever.  Frequent urination or passing small amounts of urine frequently.  Needing to urinate urgently.  Pain or burning with urination.  Urine that smells bad or unusual.  Cloudy urine.  Pain in the lower abdomen or back.  Trouble urinating.  Blood in the urine.  Vomiting or being less hungry than normal.  Diarrhea or abdominal pain.  Vaginal discharge, if you are female.  How is this  diagnosed? This condition is diagnosed with a medical history and physical exam. You will also need to provide a urine sample to test your urine. Other tests may be done, including:  Blood tests.  Sexually transmitted disease (STD) testing.  If you have had more than one UTI, a cystoscopy or imaging studies may be done to determine the cause of the infections. How is this treated? Treatment for this condition often includes a combination of two or more of the following:  Antibiotic medicine.  Other medicines to treat less common causes of UTI.  Over-the-counter medicines to treat pain.  Drinking enough water to stay hydrated.  Follow these instructions at home:  Take over-the-counter and prescription medicines only as told by your health care provider.  If you were prescribed an antibiotic, take it as told by your health care provider. Do not stop taking the antibiotic even if you start to feel better.  Avoid alcohol, caffeine, tea, and carbonated beverages. They can irritate your bladder.  Drink enough fluid to keep your urine clear or pale yellow.  Keep all follow-up visits as told by your health care provider. This is important.  Make sure to: ? Empty your bladder often and completely. Do not hold urine for long periods of time. ? Empty your bladder before and after sex. ? Wipe from front to back after a bowel movement if you are female. Use each tissue one time when you wipe. Contact a health care provider if:  You have back pain.  You have a fever.  You feel nauseous or   vomit.  Your symptoms do not get better after 3 days.  Your symptoms go away and then return. Get help right away if:  You have severe back pain or lower abdominal pain.  You are vomiting and cannot keep down any medicines or water. This information is not intended to replace advice given to you by your health care provider. Make sure you discuss any questions you have with your health care  provider. Document Released: 05/15/2005 Document Revised: 01/17/2016 Document Reviewed: 06/26/2015 Elsevier Interactive Patient Education  2017 [employer]  

## 2016-09-12 NOTE — Progress Notes (Signed)
   HPI  Patient presents today here with concern for UTI and with cough.  Cough and cold symptoms for 5 days including cough, nasal congestion, sore throat, chest congestion. Patient feels a little bit wheezy in the last 2 days. She's had sweats and chills and no fevers. She's tolerating food and fluids normally.  She's had urinary frequency for 3 days. She's also had lower back pain on the left lower side of her back for about 1-2 weeks. Patient denies dysuria hematuria, or foul-smelling urine.   PMH: Smoking status noted ROS: Per HPI  Objective: BP 108/70   Pulse 91   Temp (!) 96.5 F (35.8 C) (Oral)   Ht 5\' 7"  (1.702 m)   Wt 168 lb 3.4 oz (76.3 kg)   BMI 26.35 kg/m  Gen: NAD, alert, cooperative with exam HEENT: NCAT, TMs normal bilaterally, nares clear, oropharynx moist and clear CV: RRR, good S1/S2, no murmur Resp: CTABL, no wheezes, non-labored Abd:  no CVA tenderness, positive suprapubic tenderness to palpation Ext: No edema, warm Neuro: Alert and oriented, No gross deficits  Assessment and plan:  # UTI Treat with Omnicef, this would cover also most developing respiratory infections. NO need for culture, no signs of urosepsis.   # Cough Likely be a viral etiology, however given 5 days with worsening course it's reasonable to go ahead and treat for possible underlying bacterial infection. Low likelihood for influenza. Given UTI symptoms are covered with Omnicef     Orders Placed This Encounter  Procedures  . Urinalysis, Complete    Meds ordered this encounter  Medications  . cefdinir (OMNICEF) 300 MG capsule    Sig: Take 1 capsule (300 mg total) by mouth 2 (two) times daily. 1 po BID    Dispense:  20 capsule    Refill:  Boon, MD [facility] 09/12/2016, 2:05 PM

## 2016-09-16 ENCOUNTER — Other Ambulatory Visit: Payer: Self-pay

## 2016-09-16 MED ORDER — FLUCONAZOLE 150 MG PO TABS: 150.0000 mg | ORAL_TABLET | Freq: Once | ORAL | 0 refills | Status: AC

## 2016-09-17 ENCOUNTER — Telehealth: Payer: Self-pay | Admitting: Family

## 2016-09-17 NOTE — Telephone Encounter (Signed)
Yes this could be normal. Is pt still having dysuria?

## 2016-09-17 NOTE — Telephone Encounter (Signed)
Pt aware this is normal. She did not have dysuria. Had LBP & still does

## 2016-10-16 NOTE — Addendum Note (Signed)
Addended by: Timmothy Euler on: 10/16/2016 02:24 PM   Modules accepted: Level of Service

## 2016-11-08 ENCOUNTER — Ambulatory Visit (INDEPENDENT_AMBULATORY_CARE_PROVIDER_SITE_OTHER): Payer: 59 | Admitting: *Deleted

## 2016-11-08 ENCOUNTER — Other Ambulatory Visit: Payer: Self-pay | Admitting: *Deleted

## 2016-11-08 DIAGNOSIS — R109 Unspecified abdominal pain: Secondary | ICD-10-CM

## 2016-11-08 MED ORDER — METHYLPREDNISOLONE ACETATE 80 MG/ML IJ SUSP
80.0000 mg | Freq: Once | INTRAMUSCULAR | Status: AC
  Administered 2016-11-08: 80 mg via INTRAMUSCULAR

## 2016-11-08 NOTE — Progress Notes (Signed)
Patient given depo-medrol and tolerated well.

## 2016-11-19 ENCOUNTER — Other Ambulatory Visit: Payer: Self-pay | Admitting: Family

## 2016-11-19 DIAGNOSIS — F411 Generalized anxiety disorder: Secondary | ICD-10-CM

## 2016-11-19 DIAGNOSIS — F5101 Primary insomnia: Secondary | ICD-10-CM

## 2016-11-19 MED FILL — SERTRALINE HCL 100 MG TAB: 100 | 30 days supply | Qty: 30 | Fill #1

## 2016-11-19 NOTE — Telephone Encounter (Signed)
Last filled 05/01/2016. If approved please route to pool and have nurse call into pharmacy

## 2016-11-21 MED FILL — ALPRAZolam 0.5 MG TABS: 0.5 | 30 days supply | Qty: 60 | Fill #0

## 2016-11-21 NOTE — Telephone Encounter (Signed)
Xanax refills called to pharmacy voice mail. 

## 2016-12-05 ENCOUNTER — Encounter: Payer: Self-pay | Admitting: Family Medicine

## 2016-12-05 ENCOUNTER — Ambulatory Visit (INDEPENDENT_AMBULATORY_CARE_PROVIDER_SITE_OTHER): Payer: 59 | Admitting: Family Medicine

## 2016-12-05 VITALS — BP 98/60 | HR 76 | Temp 98.9°F | Ht 67.0 in | Wt 163.0 lb

## 2016-12-05 DIAGNOSIS — Z0000 Encounter for general adult medical examination without abnormal findings: Secondary | ICD-10-CM

## 2016-12-05 DIAGNOSIS — R5383 Other fatigue: Secondary | ICD-10-CM

## 2016-12-05 NOTE — Progress Notes (Signed)
BP 98/60   Pulse 76   Temp 98.9 F (37.2 C) (Oral)   Ht '5\' 7"'$  (1.702 m)   Wt 163 lb (73.9 kg)   BMI 25.53 kg/m    Subjective:    Patient ID: Katherine Hays, female    DOB: [birth year], 46 y.o.   MRN: [id number]  HPI: Katherine Hays is a 46 y.o. female presenting on 12/05/2016 for Labwork (patient is fasting)   HPI Adult well exam and labs Patient is coming in today for an adult well exam and fasting lab work. She denies any major medical issues and has been doing well on her antidepressant medications. She says she does have some fatigue and was concerned because she does have a family history of thyroid in her mother and some aunts. She otherwise denies any major health issues going on. Patient denies any chest pain, shortness of breath, headaches or vision issues, abdominal complaints, diarrhea, nausea, vomiting, or joint issues. She denies any hair changes or nail changes or palpitations or abdominal issues.  Relevant past medical, surgical, family and social history reviewed and updated as indicated. Interim medical history since our last visit reviewed. Allergies and medications reviewed and updated.  Review of Systems  Constitutional: Positive for fatigue. Negative for chills and fever.  HENT: Negative for ear pain and tinnitus.   Eyes: Negative for pain.  Respiratory: Negative for cough, shortness of breath and wheezing.   Cardiovascular: Negative for chest pain, palpitations and leg swelling.  Gastrointestinal: Negative for abdominal pain, blood in stool, constipation and diarrhea.  Endocrine: Negative for cold intolerance and heat intolerance.  Genitourinary: Negative for dysuria and hematuria.  Musculoskeletal: Negative for back pain and myalgias.  Skin: Negative for rash.  Neurological: Negative for dizziness, weakness and headaches.  Psychiatric/Behavioral: Negative for suicidal ideas.    Per HPI unless specifically indicated above   Allergies as of  12/05/2016      Reactions   Codeine       Medication List       Accurate as of 12/05/16  9:00 AM. Always use your most recent med list.          ALPRAZolam 0.5 MG tablet Commonly known as:  XANAX TAKE 1 TABLET BY MOUTH TWICE DAILY AS NEEDED   levonorgestrel 20 MCG/24HR IUD Commonly known as:  MIRENA 1 each by Intrauterine route once.   pantoprazole 20 MG tablet Commonly known as:  PROTONIX Take 1 tablet (20 mg total) by mouth daily.   sertraline 100 MG tablet Commonly known as:  ZOLOFT Take 1 tablet (100 mg total) by mouth daily.          Objective:    BP 98/60   Pulse 76   Temp 98.9 F (37.2 C) (Oral)   Ht '5\' 7"'$  (1.702 m)   Wt 163 lb (73.9 kg)   BMI 25.53 kg/m   Wt Readings from Last 3 Encounters:  12/05/16 163 lb (73.9 kg)  09/12/16 168 lb 3.4 oz (76.3 kg)  08/30/16 169 lb 9.6 oz (76.9 kg)    Physical Exam  Constitutional: She is oriented to person, place, and time. She appears well-developed and well-nourished. No distress.  HENT:  Right Ear: External ear normal.  Left Ear: External ear normal.  Nose: Nose normal.  Mouth/Throat: Oropharynx is clear and moist. No oropharyngeal exudate.  Eyes: Conjunctivae and EOM are normal. Pupils are equal, round, and reactive to light. Right eye exhibits no discharge. Left eye exhibits no discharge.  No scleral icterus.  Neck: Neck supple. No thyromegaly present.  Cardiovascular: Normal rate, regular rhythm, normal heart sounds and intact distal pulses.   No murmur heard. Pulmonary/Chest: Effort normal and breath sounds normal. No respiratory distress. She has no wheezes.  Abdominal: Soft. Bowel sounds are normal. She exhibits no distension. There is no tenderness. There is no rebound and no guarding.  Musculoskeletal: Normal range of motion. She exhibits no edema or tenderness.  Lymphadenopathy:    She has no cervical adenopathy.  Neurological: She is alert and oriented to person, place, and time. Coordination  normal.  Skin: Skin is warm and dry. No rash noted. She is not diaphoretic.  Psychiatric: She has a normal mood and affect. Her behavior is normal. Thought content normal.  Nursing note and vitals reviewed.       Assessment & Plan:   Problem List Items Addressed This Visit    None    Visit Diagnoses    Well adult exam    -  Primary   Relevant Orders   CMP14+EGFR   CBC with Differential/Platelet   Lipid panel   Other fatigue       Relevant Orders   TSH       Follow up plan: Return in about 1 year (around 12/05/2017), or if symptoms worsen or fail to improve.  Counseling provided for all of the vaccine components Orders Placed This Encounter  Procedures  . CMP14+EGFR  . CBC with Differential/Platelet  . Lipid panel  . TSH    Caryl Pina, MD [facility] 12/05/2016, 9:00 AM

## 2016-12-06 LAB — CBC WITH DIFFERENTIAL/PLATELET
BASOS: 0 %
Basophils Absolute: 0 10*3/uL (ref 0.0–0.2)
EOS (ABSOLUTE): 0.1 10*3/uL (ref 0.0–0.4)
Eos: 2 %
HEMOGLOBIN: 13 g/dL (ref 11.1–15.9)
Hematocrit: 39.5 % (ref 34.0–46.6)
Immature Grans (Abs): 0 10*3/uL (ref 0.0–0.1)
Immature Granulocytes: 0 %
LYMPHS ABS: 1.7 10*3/uL (ref 0.7–3.1)
Lymphs: 24 %
MCH: 29.5 pg (ref 26.6–33.0)
MCHC: 32.9 g/dL (ref 31.5–35.7)
MCV: 90 fL (ref 79–97)
MONOCYTES: 9 %
Monocytes Absolute: 0.6 10*3/uL (ref 0.1–0.9)
NEUTROS ABS: 4.6 10*3/uL (ref 1.4–7.0)
Neutrophils: 65 %
Platelets: 315 10*3/uL (ref 150–379)
RBC: 4.41 x10E6/uL (ref 3.77–5.28)
RDW: 12.9 % (ref 12.3–15.4)
WBC: 7.1 10*3/uL (ref 3.4–10.8)

## 2016-12-06 LAB — CMP14+EGFR
ALBUMIN: 4.5 g/dL (ref 3.5–5.5)
ALT: 10 IU/L (ref 0–32)
AST: 14 IU/L (ref 0–40)
Albumin/Globulin Ratio: 1.9 (ref 1.2–2.2)
Alkaline Phosphatase: 73 IU/L (ref 39–117)
BUN / CREAT RATIO: 14 (ref 9–23)
BUN: 9 mg/dL (ref 6–24)
Bilirubin Total: 0.4 mg/dL (ref 0.0–1.2)
CO2: 23 mmol/L (ref 18–29)
CREATININE: 0.65 mg/dL (ref 0.57–1.00)
Calcium: 9.2 mg/dL (ref 8.7–10.2)
Chloride: 100 mmol/L (ref 96–106)
GFR, EST AFRICAN AMERICAN: 122 mL/min/{1.73_m2} (ref >59)
GFR, EST NON AFRICAN AMERICAN: 106 mL/min/{1.73_m2} (ref >59)
GLOBULIN, TOTAL: 2.4 g/dL (ref 1.5–4.5)
GLUCOSE: 90 mg/dL (ref 65–99)
Potassium: 4.5 mmol/L (ref 3.5–5.2)
Sodium: 139 mmol/L (ref 134–144)
TOTAL PROTEIN: 6.9 g/dL (ref 6.0–8.5)

## 2016-12-06 LAB — LIPID PANEL
Chol/HDL Ratio: 3.4 ratio (ref 0.0–4.4)
Cholesterol, Total: 155 mg/dL (ref 100–199)
HDL: 45 mg/dL (ref >39)
LDL CALC: 98 mg/dL (ref 0–99)
TRIGLYCERIDES: 61 mg/dL (ref 0–149)
VLDL Cholesterol Cal: 12 mg/dL (ref 5–40)

## 2016-12-06 LAB — TSH: TSH: 4.02 u[IU]/mL (ref 0.450–4.500)

## 2016-12-16 ENCOUNTER — Encounter: Payer: 59 | Admitting: *Deleted

## 2016-12-24 MED FILL — ALPRAZolam 0.5 MG TABS: 0.5 | 30 days supply | Qty: 60 | Fill #1 | Status: TO

## 2016-12-24 MED FILL — SERTRALINE HCL 100 MG TAB: 100 | 30 days supply | Qty: 30 | Fill #2

## 2017-01-27 DIAGNOSIS — Z1231 Encounter for screening mammogram for malignant neoplasm of breast: Secondary | ICD-10-CM | POA: Diagnosis not present

## 2017-01-29 MED FILL — SERTRALINE HCL 100 MG TAB: 100 | 30 days supply | Qty: 30 | Fill #3 | Status: TO

## 2017-03-17 MED FILL — SERTRALINE HCL 100 MG TAB: 100 | 30 days supply | Qty: 30 | Fill #0

## 2017-04-03 MED FILL — ALPRAZolam 0.5 MG TABS: 0.5 | 30 days supply | Qty: 60 | Fill #0

## 2017-04-15 ENCOUNTER — Telehealth: Payer: Self-pay | Admitting: Family

## 2017-04-15 ENCOUNTER — Other Ambulatory Visit: Payer: Self-pay

## 2017-04-15 MED ORDER — FLUCONAZOLE 150 MG PO TABS: 150.0000 mg | ORAL_TABLET | ORAL | 0 refills | Status: DC | PRN

## 2017-04-15 NOTE — Progress Notes (Unsigned)
Rx sent to wrong pharmacy. Rx sent to correct pharmacy.

## 2017-04-15 NOTE — Telephone Encounter (Signed)
Diflucan Prescription sent to pharmacy   

## 2017-04-15 NOTE — Telephone Encounter (Signed)
Patient aware.

## 2017-04-20 MED FILL — SERTRALINE HCL 100 MG TAB: 100 | 30 days supply | Qty: 30 | Fill #1

## 2017-04-23 ENCOUNTER — Encounter: Payer: Self-pay | Admitting: Family

## 2017-04-23 ENCOUNTER — Ambulatory Visit: Payer: 59 | Admitting: Family

## 2017-04-23 ENCOUNTER — Ambulatory Visit (INDEPENDENT_AMBULATORY_CARE_PROVIDER_SITE_OTHER): Payer: 59 | Admitting: Family

## 2017-04-23 VITALS — BP 101/67 | HR 69 | Temp 97.8°F | Ht 67.0 in | Wt 165.6 lb

## 2017-04-23 DIAGNOSIS — R309 Painful micturition, unspecified: Secondary | ICD-10-CM

## 2017-04-23 DIAGNOSIS — N3001 Acute cystitis with hematuria: Secondary | ICD-10-CM

## 2017-04-23 LAB — URINALYSIS, COMPLETE
BILIRUBIN UA: NEGATIVE
Glucose, UA: NEGATIVE
Ketones, UA: NEGATIVE
Nitrite, UA: NEGATIVE
PH UA: 7 (ref 5.0–7.5)
PROTEIN UA: NEGATIVE
Specific Gravity, UA: 1.02 (ref 1.005–1.030)
Urobilinogen, Ur: 0.2 mg/dL (ref 0.2–1.0)

## 2017-04-23 LAB — MICROSCOPIC EXAMINATION: RENAL EPITHEL UA: NONE SEEN /HPF

## 2017-04-23 MED ORDER — SULFAMETHOXAZOLE-TRIMETHOPRIM 800-160 MG PO TABS: 1.0000 | ORAL_TABLET | Freq: Two times a day (BID) | ORAL | 0 refills | Status: DC

## 2017-04-23 NOTE — Patient Instructions (Signed)

## 2017-04-23 NOTE — Progress Notes (Signed)
   Subjective:    Patient ID: Katherine Hays, female    DOB: 1971/03/21, 46 y.o.   MRN: [id number]  Back Pain  This is a new problem. The current episode started in the past 7 days. The problem occurs constantly. The problem has been gradually worsening since onset. The pain is present in the lumbar spine.  Urinary Frequency   The pain is at a severity of 1/10. The pain is mild. Associated symptoms include a discharge, flank pain, frequency, hematuria, hesitancy and urgency. Pertinent negatives include no nausea or vomiting. She has tried nothing for the symptoms. The treatment provided no relief.      Review of Systems  Gastrointestinal: Negative for nausea and vomiting.  Genitourinary: Positive for flank pain, frequency, hematuria, hesitancy and urgency.  Musculoskeletal: Positive for back pain.  All other systems reviewed and are negative.      Objective:   Physical Exam  Constitutional: She is oriented to person, place, and time. She appears well-developed and well-nourished. No distress.  HENT:  Head: Normocephalic.  Eyes: Pupils are equal, round, and reactive to light.  Cardiovascular: Normal rate, regular rhythm, normal heart sounds and intact distal pulses.   No murmur heard. Pulmonary/Chest: Effort normal and breath sounds normal. No respiratory distress. She has no wheezes.  Abdominal: Soft. Bowel sounds are normal. She exhibits no distension. There is tenderness (mild lower abdomen ).  Musculoskeletal: Normal range of motion. She exhibits no edema or tenderness.  Neurological: She is alert and oriented to person, place, and time.  Skin: Skin is warm and dry.  Psychiatric: She has a normal mood and affect. Her behavior is normal. Judgment and thought content normal.  Vitals reviewed.    BP 101/67   Pulse 69   Temp 97.8 F (36.6 C) (Oral)   Ht 5\' 7"  (1.702 m)   Wt 165 lb 9.6 oz (75.1 kg)   BMI 25.94 kg/m      Assessment & Plan:  1. Pain passing urine -  Urinalysis, Complete  2. Acute cystitis with hematuria Force fluids AZO over the counter X2 days RTO prn Culture pending - Urine Culture - sulfamethoxazole-trimethoprim (BACTRIM DS) 800-160 MG tablet; Take 1 tablet by mouth 2 (two) times daily.  Dispense: 14 tablet; Refill: 0    Evelina Dun, FNP

## 2017-04-26 LAB — URINE CULTURE

## 2017-04-28 ENCOUNTER — Other Ambulatory Visit: Payer: Self-pay | Admitting: *Deleted

## 2017-04-28 ENCOUNTER — Telehealth: Payer: Self-pay | Admitting: Family

## 2017-04-28 ENCOUNTER — Other Ambulatory Visit: Payer: Self-pay | Admitting: Family

## 2017-04-28 MED ORDER — CIPROFLOXACIN HCL 500 MG PO TABS: 500.0000 mg | ORAL_TABLET | Freq: Two times a day (BID) | ORAL | 0 refills | Status: DC

## 2017-04-28 NOTE — Telephone Encounter (Signed)
Cipro was sent to [facility] by Evelina Dun, FNP

## 2017-05-06 ENCOUNTER — Telehealth: Payer: Self-pay | Admitting: Family

## 2017-05-06 NOTE — Telephone Encounter (Signed)
PT can leave another sample

## 2017-05-07 ENCOUNTER — Other Ambulatory Visit: Payer: Self-pay | Admitting: *Deleted

## 2017-05-07 ENCOUNTER — Other Ambulatory Visit: Payer: 59

## 2017-05-07 DIAGNOSIS — R399 Unspecified symptoms and signs involving the genitourinary system: Secondary | ICD-10-CM

## 2017-05-07 DIAGNOSIS — R300 Dysuria: Secondary | ICD-10-CM

## 2017-05-07 LAB — URINALYSIS, COMPLETE
Bilirubin, UA: NEGATIVE
GLUCOSE, UA: NEGATIVE
KETONES UA: NEGATIVE
Nitrite, UA: NEGATIVE
Protein, UA: NEGATIVE
SPEC GRAV UA: 1.025 (ref 1.005–1.030)
Urobilinogen, Ur: 0.2 mg/dL (ref 0.2–1.0)
pH, UA: 6 (ref 5.0–7.5)

## 2017-05-07 LAB — MICROSCOPIC EXAMINATION: Renal Epithel, UA: NONE SEEN /hpf

## 2017-05-07 NOTE — Telephone Encounter (Signed)
Patient aware she may leave another urine sample

## 2017-05-08 ENCOUNTER — Other Ambulatory Visit: Payer: Self-pay | Admitting: *Deleted

## 2017-05-08 ENCOUNTER — Other Ambulatory Visit: Payer: Self-pay | Admitting: Family

## 2017-05-08 DIAGNOSIS — R300 Dysuria: Secondary | ICD-10-CM

## 2017-05-08 MED ORDER — NITROFURANTOIN MONOHYD MACRO 100 MG PO CAPS: 100.0000 mg | ORAL_CAPSULE | Freq: Two times a day (BID) | ORAL | 0 refills | Status: DC

## 2017-05-10 LAB — URINE CULTURE: Organism ID, Bacteria: NO GROWTH

## 2017-05-12 ENCOUNTER — Telehealth: Payer: Self-pay | Admitting: *Deleted

## 2017-05-12 NOTE — Telephone Encounter (Signed)
Patient aware of reccomendations

## 2017-05-12 NOTE — Telephone Encounter (Signed)
Urine culture was negative, PT should take AZO OTC to help with symptoms. If back pain continues could be muscle pain/strain?

## 2017-05-12 NOTE — Telephone Encounter (Signed)
Patient is almost finished with Macrobid.  Patient complains of urgency and low back pain.

## 2017-05-26 DIAGNOSIS — H524 Presbyopia: Secondary | ICD-10-CM | POA: Diagnosis not present

## 2017-05-26 DIAGNOSIS — H5213 Myopia, bilateral: Secondary | ICD-10-CM | POA: Diagnosis not present

## 2017-05-26 DIAGNOSIS — H52223 Regular astigmatism, bilateral: Secondary | ICD-10-CM | POA: Diagnosis not present

## 2017-06-17 ENCOUNTER — Other Ambulatory Visit: Payer: Self-pay | Admitting: Family

## 2017-06-17 DIAGNOSIS — F5101 Primary insomnia: Secondary | ICD-10-CM

## 2017-06-17 DIAGNOSIS — F411 Generalized anxiety disorder: Secondary | ICD-10-CM

## 2017-06-17 MED FILL — SERTRALINE HCL 100 MG TAB: 100 | 30 days supply | Qty: 30 | Fill #2

## 2017-06-19 MED FILL — ALPRAZolam 0.5 MG TABS: 0.5 | 30 days supply | Qty: 60 | Fill #0

## 2017-06-19 NOTE — Telephone Encounter (Signed)
rx phoned in

## 2017-07-08 ENCOUNTER — Encounter: Payer: Self-pay | Admitting: Family Medicine

## 2017-07-18 ENCOUNTER — Ambulatory Visit (INDEPENDENT_AMBULATORY_CARE_PROVIDER_SITE_OTHER): Payer: 59 | Admitting: Family Medicine

## 2017-07-18 ENCOUNTER — Encounter: Payer: Self-pay | Admitting: Family Medicine

## 2017-07-18 VITALS — Temp 98.2°F | Ht 67.0 in | Wt 166.0 lb

## 2017-07-18 DIAGNOSIS — R52 Pain, unspecified: Secondary | ICD-10-CM

## 2017-07-18 DIAGNOSIS — J029 Acute pharyngitis, unspecified: Secondary | ICD-10-CM

## 2017-07-18 LAB — RAPID STREP SCREEN (MED CTR MEBANE ONLY): Strep Gp A Ag, IA W/Reflex: NEGATIVE

## 2017-07-18 LAB — VERITOR FLU A/B WAIVED
INFLUENZA A: NEGATIVE
INFLUENZA B: NEGATIVE

## 2017-07-18 LAB — CULTURE, GROUP A STREP

## 2017-07-18 MED ORDER — OSELTAMIVIR PHOSPHATE 75 MG PO CAPS: 75.0000 mg | ORAL_CAPSULE | Freq: Two times a day (BID) | ORAL | 0 refills | Status: DC

## 2017-07-18 NOTE — Progress Notes (Signed)
Chief Complaint  Patient presents with  . Sinus Problem    pt here today c/o facial pressure, teeth pain and "back of eyes hurting"    HPI  Patient presents today for Patient presents with upper respiratory congestion.  She has moderate headache.  Her teeth hurt.  There is pain near the right angle of the mandible inferiorly from a swollen lymph node.  Her left ear hurts.  There is moderate sore throat. Patient reports coughing frequently as well.  Scant but purulent sputum noted. There is no fever, chills or sweats.  However she says she has had the flu and various illnesses over her adult life and she never gets a fever.  The patient denies being short of breath. Onset was upon awakening yesterday morning. Gradually worsening through the day yesterday and this morning.   PMH: Smoking status noted ROS: Per HPI  Objective: Temp 98.2 F (36.8 C) (Oral)   Ht 5\' 7"  (1.702 m)   Wt 166 lb (75.3 kg)   BMI 26.00 kg/m  Gen: NAD, alert, cooperative with exam HEENT: NCAT, Nasal passages swollen, red TMS clear.  Seem tender over the maxillary region.  Not dramatic CV: RRR, good S1/S2, no murmur Resp: CTA Ext: No edema, warm Neuro: Alert and oriented, No gross deficits  Assessment and plan:  1. Sore throat   2. Body aches     Meds ordered this encounter  Medications  . oseltamivir (TAMIFLU) 75 MG capsule    Sig: Take 1 capsule (75 mg total) by mouth 2 (two) times daily.    Dispense:  10 capsule    Refill:  0    Orders Placed This Encounter  Procedures  . Rapid Strep Screen (Not at [facility])  . Culture, Group A Strep  . Veritor Flu A/B Waived    Order Specific Question:   Source    Answer:   nasal    Follow up as needed.  Claretta Fraise, MD

## 2017-08-13 MED FILL — SERTRALINE HCL 100 MG TAB: 100 | 30 days supply | Qty: 30 | Fill #3

## 2017-08-13 MED FILL — ALPRAZolam 0.5 MG TABS: 0.5 | 30 days supply | Qty: 60 | Fill #1

## 2017-09-30 ENCOUNTER — Other Ambulatory Visit: Payer: Self-pay | Admitting: Physician Assistant

## 2017-09-30 DIAGNOSIS — F411 Generalized anxiety disorder: Secondary | ICD-10-CM

## 2017-09-30 DIAGNOSIS — Z8659 Personal history of other mental and behavioral disorders: Secondary | ICD-10-CM

## 2017-09-30 MED FILL — SHIPPING COST: 1 days supply | Qty: 1 | Fill #0

## 2017-09-30 MED FILL — SERTRALINE HCL 100 MG TAB: 100 | 90 days supply | Qty: 90 | Fill #0

## 2017-11-05 ENCOUNTER — Other Ambulatory Visit: Payer: Self-pay | Admitting: Physician Assistant

## 2017-11-05 DIAGNOSIS — F5101 Primary insomnia: Secondary | ICD-10-CM

## 2017-11-05 DIAGNOSIS — F411 Generalized anxiety disorder: Secondary | ICD-10-CM

## 2017-11-06 NOTE — Telephone Encounter (Signed)
Last seen 07/18/17

## 2017-11-10 ENCOUNTER — Ambulatory Visit (INDEPENDENT_AMBULATORY_CARE_PROVIDER_SITE_OTHER): Payer: 59 | Admitting: Family

## 2017-11-10 ENCOUNTER — Encounter: Payer: Self-pay | Admitting: Family

## 2017-11-10 VITALS — BP 109/73 | HR 74 | Temp 97.8°F | Ht 67.0 in | Wt 168.8 lb

## 2017-11-10 DIAGNOSIS — K219 Gastro-esophageal reflux disease without esophagitis: Secondary | ICD-10-CM

## 2017-11-10 DIAGNOSIS — Z8659 Personal history of other mental and behavioral disorders: Secondary | ICD-10-CM

## 2017-11-10 DIAGNOSIS — Z0000 Encounter for general adult medical examination without abnormal findings: Secondary | ICD-10-CM | POA: Diagnosis not present

## 2017-11-10 DIAGNOSIS — R197 Diarrhea, unspecified: Secondary | ICD-10-CM

## 2017-11-10 DIAGNOSIS — F5101 Primary insomnia: Secondary | ICD-10-CM

## 2017-11-10 DIAGNOSIS — F411 Generalized anxiety disorder: Secondary | ICD-10-CM

## 2017-11-10 DIAGNOSIS — G4700 Insomnia, unspecified: Secondary | ICD-10-CM

## 2017-11-10 MED ORDER — ALPRAZOLAM 0.5 MG PO TABS: ORAL_TABLET | ORAL | 4 refills | Status: DC

## 2017-11-10 MED ORDER — SERTRALINE HCL 100 MG PO TABS: 100.0000 mg | ORAL_TABLET | Freq: Every day | ORAL | 11 refills | Status: DC

## 2017-11-10 MED ORDER — PANTOPRAZOLE SODIUM 20 MG PO TBEC: 20.0000 mg | DELAYED_RELEASE_TABLET | Freq: Every day | ORAL | 1 refills | Status: DC

## 2017-11-10 NOTE — Patient Instructions (Signed)
Diarrhea, Adult Diarrhea is frequent loose and watery bowel movements. Diarrhea can make you feel weak and cause you to become dehydrated. Dehydration can make you tired and thirsty, cause you to have a dry mouth, and decrease how often you urinate. Diarrhea typically lasts 2-3 days. However, it can last longer if it is a sign of something more serious. It is important to treat your diarrhea as told by your health care provider. Follow these instructions at home: Eating and drinking  Follow these recommendations as told by your health care provider:  Take an oral rehydration solution (ORS). This is a drink that is sold at pharmacies and retail stores.  Drink clear fluids, such as water, ice chips, diluted fruit juice, and low-calorie sports drinks.  Eat bland, easy-to-digest foods in small amounts as you are able. These foods include bananas, applesauce, rice, lean meats, toast, and crackers.  Avoid drinking fluids that contain a lot of sugar or caffeine, such as energy drinks, sports drinks, and soda.  Avoid alcohol.  Avoid spicy or fatty foods.  General instructions  Drink enough fluid to keep your urine clear or pale yellow.  Wash your hands often. If soap and water are not available, use hand sanitizer.  Make sure that all people in your household wash their hands well and often.  Take over-the-counter and prescription medicines only as told by your health care provider.  Rest at home while you recover.  Watch your condition for any changes.  Take a warm bath to relieve any burning or pain from frequent diarrhea episodes.  Keep all follow-up visits as told by your health care provider. This is important. Contact a health care provider if:  You have a fever.  Your diarrhea gets worse.  You have new symptoms.  You cannot keep fluids down.  You feel light-headed or dizzy.  You have a headache  You have muscle cramps. Get help right away if:  You have chest  pain.  You feel extremely weak or you faint.  You have bloody or black stools or stools that look like tar.  You have severe pain, cramping, or bloating in your abdomen.  You have trouble breathing or you are breathing very quickly.  Your heart is beating very quickly.  Your skin feels cold and clammy.  You feel confused.  You have signs of dehydration, such as: ? Dark urine, very little urine, or no urine. ? Cracked lips. ? Dry mouth. ? Sunken eyes. ? Sleepiness. ? Weakness. This information is not intended to replace advice given to you by your health care provider. Make sure you discuss any questions you have with your health care provider. Document Released: 07/26/2002 Document Revised: 12/14/2015 Document Reviewed: 04/11/2015 Elsevier Interactive Patient Education  2018 Elsevier Inc.  

## 2017-11-10 NOTE — Progress Notes (Signed)
Subjective:    Patient ID: Katherine Hays, female    DOB: [birth year], 47 y.o.   MRN: [id number]  PT presents to the office today for CPE without pap.   Anxiety  Presents for follow-up visit. Symptoms include excessive worry, irritability and nervous/anxious behavior. Patient reports no depressed mood. Symptoms occur occasionally. The severity of symptoms is moderate. The quality of sleep is good.    Gastroesophageal Reflux  She complains of belching and a hoarse voice. She reports no coughing. This is a chronic problem. The problem occurs occasionally. The symptoms are aggravated by medications. She has tried a PPI for the symptoms. The treatment provided moderate relief.  Diarrhea   This is a chronic problem. The current episode started more than 1 year ago. The problem occurs 2 to 4 times per day. The problem has been unchanged. The patient states that diarrhea does not awaken her from sleep. Pertinent negatives include no coughing. She has tried anti-motility drug for the symptoms. The treatment provided mild relief.      Review of Systems  Constitutional: Positive for irritability.  HENT: Positive for hoarse voice.   Respiratory: Negative for cough.   Gastrointestinal: Positive for diarrhea.  Psychiatric/Behavioral: The patient is nervous/anxious.   All other systems reviewed and are negative.   Family History  Problem Relation Age of Onset  . Liver disease Father        cirrhosis  . Pancreatic cancer Maternal Uncle   . Diabetes Maternal Grandfather   . Stroke Maternal Grandfather   . Breast cancer Paternal Grandmother   . Cancer Paternal Grandmother        breast  . Heart disease Paternal Grandfather   . Stroke Paternal Grandfather   . Hyperthyroidism Mother   . Hypertension Brother   . Crohn's disease Unknown        Nephew    Social History   Socioeconomic History  . Marital status: Married    Spouse name: Not on file  . Number of children: 3  . Years of  education: Not on file  . Highest education level: Not on file  Occupational History  . Occupation: Licensed conveyancer: [employer]  . Financial resource strain: Not on file  . Food insecurity:    Worry: Not on file    Inability: Not on file  . Transportation needs:    Medical: Not on file    Non-medical: Not on file  Tobacco Use  . Smoking status: Never Smoker  . Smokeless tobacco: Never Used  Substance and Sexual Activity  . Alcohol use: No  . Drug use: No  . Sexual activity: Yes  Lifestyle  . Physical activity:    Days per week: Not on file    Minutes per session: Not on file  . Stress: Not on file  Relationships  . Social connections:    Talks on phone: Not on file    Gets together: Not on file    Attends religious service: Not on file    Active member of club or organization: Not on file    Attends meetings of clubs or organizations: Not on file    Relationship status: Not on file  Other Topics Concern  . Not on file  Social History Narrative  . Not on file       Objective:   Physical Exam  Constitutional: She is oriented to person, place, and time. She appears well-developed and well-nourished. No  distress.  HENT:  Head: Normocephalic and atraumatic.  Right Ear: External ear normal.  Left Ear: External ear normal.  Nose: Nose normal.  Mouth/Throat: Oropharynx is clear and moist.  Eyes: Pupils are equal, round, and reactive to light.  Neck: Normal range of motion. Neck supple. No thyromegaly present.  Cardiovascular: Normal rate, regular rhythm, normal heart sounds and intact distal pulses.  No murmur heard. Pulmonary/Chest: Effort normal and breath sounds normal. No respiratory distress. She has no wheezes.  Abdominal: Soft. Bowel sounds are normal. She exhibits no distension. There is no tenderness.  Musculoskeletal: Normal range of motion. She exhibits no edema or tenderness.  Neurological: She is alert and oriented to person, place,  and time.  Skin: Skin is warm and dry.  Psychiatric: She has a normal mood and affect. Her behavior is normal. Judgment and thought content normal.  Vitals reviewed.    BP 109/73   Pulse 74   Temp 97.8 F (36.6 C) (Oral)   Ht '5\' 7"'$  (1.702 m)   Wt 168 lb 12.8 oz (76.6 kg)   BMI 26.44 kg/m      Assessment & Plan:  1. Annual physical exam - Anemia Profile B; Future - CMP14+EGFR; Future - Lipid panel; Future - TSH; Future - VITAMIN D 25 Hydroxy (Vit-D Deficiency, Fractures); Future  2. GAD (generalized anxiety disorder) - CMP14+EGFR; Future - sertraline (ZOLOFT) 100 MG tablet; Take 1 tablet (100 mg total) by mouth daily.  Dispense: 30 tablet; Refill: 11 - ALPRAZolam (XANAX) 0.5 MG tablet; TAKE  (1)  TABLET TWICE A DAY.  Dispense: 60 tablet; Refill: 4  3. Gastroesophageal reflux disease, esophagitis presence not specified - CMP14+EGFR; Future - pantoprazole (PROTONIX) 20 MG tablet; Take 1 tablet (20 mg total) by mouth daily.  Dispense: 90 tablet; Refill: 1  4. History of OCD (obsessive compulsive disorder) - CMP14+EGFR; Future - sertraline (ZOLOFT) 100 MG tablet; Take 1 tablet (100 mg total) by mouth daily.  Dispense: 30 tablet; Refill: 11  5. Primary insomnia - CMP14+EGFR; Future - ALPRAZolam (XANAX) 0.5 MG tablet; TAKE  (1)  TABLET TWICE A DAY.  Dispense: 60 tablet; Refill: 4  6. Insomnia, unspecified type  7. Diarrhea, unspecified type - Ambulatory referral to Gastroenterology   Continue all meds Labs pending Health Maintenance reviewed Diet and exercise encouraged RTO 6 months   Evelina Dun, FNP

## 2017-11-11 ENCOUNTER — Other Ambulatory Visit: Payer: 59

## 2017-11-11 DIAGNOSIS — K219 Gastro-esophageal reflux disease without esophagitis: Secondary | ICD-10-CM | POA: Diagnosis not present

## 2017-11-11 DIAGNOSIS — Z0000 Encounter for general adult medical examination without abnormal findings: Secondary | ICD-10-CM | POA: Diagnosis not present

## 2017-11-11 DIAGNOSIS — F411 Generalized anxiety disorder: Secondary | ICD-10-CM | POA: Diagnosis not present

## 2017-11-11 DIAGNOSIS — F5101 Primary insomnia: Secondary | ICD-10-CM | POA: Diagnosis not present

## 2017-11-11 DIAGNOSIS — Z8659 Personal history of other mental and behavioral disorders: Secondary | ICD-10-CM | POA: Diagnosis not present

## 2017-11-11 NOTE — Addendum Note (Signed)
Addended by: Pollyann Kennedy F on: 11/11/2017 08:05 AM   Modules accepted: Orders

## 2017-11-12 LAB — ANEMIA PROFILE B
BASOS ABS: 0 10*3/uL (ref 0.0–0.2)
Basos: 1 %
EOS (ABSOLUTE): 0.2 10*3/uL (ref 0.0–0.4)
Eos: 4 %
FOLATE: 8.3 ng/mL (ref >3.0)
Ferritin: 58 ng/mL (ref 15–150)
Hematocrit: 40.2 % (ref 34.0–46.6)
Hemoglobin: 13.2 g/dL (ref 11.1–15.9)
IRON: 118 ug/dL (ref 27–159)
Immature Grans (Abs): 0 10*3/uL (ref 0.0–0.1)
Immature Granulocytes: 0 %
Iron Saturation: 40 % (ref 15–55)
LYMPHS ABS: 1.7 10*3/uL (ref 0.7–3.1)
Lymphs: 30 %
MCH: 29.9 pg (ref 26.6–33.0)
MCHC: 32.8 g/dL (ref 31.5–35.7)
MCV: 91 fL (ref 79–97)
MONOS ABS: 0.5 10*3/uL (ref 0.1–0.9)
Monocytes: 9 %
Neutrophils Absolute: 3.3 10*3/uL (ref 1.4–7.0)
Neutrophils: 56 %
PLATELETS: 342 10*3/uL (ref 150–379)
RBC: 4.42 x10E6/uL (ref 3.77–5.28)
RDW: 13.3 % (ref 12.3–15.4)
Retic Ct Pct: 0.8 % (ref 0.6–2.6)
TIBC: 294 ug/dL (ref 250–450)
UIBC: 176 ug/dL (ref 131–425)
VITAMIN B 12: 515 pg/mL (ref 232–1245)
WBC: 5.8 10*3/uL (ref 3.4–10.8)

## 2017-11-12 LAB — LIPID PANEL
CHOLESTEROL TOTAL: 174 mg/dL (ref 100–199)
Chol/HDL Ratio: 4.8 ratio — ABNORMAL HIGH (ref 0.0–4.4)
HDL: 36 mg/dL — ABNORMAL LOW (ref >39)
LDL CALC: 111 mg/dL — AB (ref 0–99)
TRIGLYCERIDES: 137 mg/dL (ref 0–149)
VLDL Cholesterol Cal: 27 mg/dL (ref 5–40)

## 2017-11-12 LAB — CMP14+EGFR
ALBUMIN: 4.5 g/dL (ref 3.5–5.5)
ALK PHOS: 74 IU/L (ref 39–117)
ALT: 13 IU/L (ref 0–32)
AST: 17 IU/L (ref 0–40)
Albumin/Globulin Ratio: 2 (ref 1.2–2.2)
BUN / CREAT RATIO: 12 (ref 9–23)
BUN: 9 mg/dL (ref 6–24)
Bilirubin Total: 0.3 mg/dL (ref 0.0–1.2)
CO2: 23 mmol/L (ref 20–29)
CREATININE: 0.75 mg/dL (ref 0.57–1.00)
Calcium: 9.3 mg/dL (ref 8.7–10.2)
Chloride: 106 mmol/L (ref 96–106)
GFR calc non Af Amer: 95 mL/min/{1.73_m2} (ref >59)
GFR, EST AFRICAN AMERICAN: 109 mL/min/{1.73_m2} (ref >59)
GLOBULIN, TOTAL: 2.2 g/dL (ref 1.5–4.5)
GLUCOSE: 88 mg/dL (ref 65–99)
Potassium: 4.7 mmol/L (ref 3.5–5.2)
SODIUM: 142 mmol/L (ref 134–144)
TOTAL PROTEIN: 6.7 g/dL (ref 6.0–8.5)

## 2017-11-12 LAB — TSH: TSH: 5.41 u[IU]/mL — AB (ref 0.450–4.500)

## 2017-11-12 LAB — VITAMIN D 25 HYDROXY (VIT D DEFICIENCY, FRACTURES): VIT D 25 HYDROXY: 26.5 ng/mL — AB (ref 30.0–100.0)

## 2017-11-13 ENCOUNTER — Other Ambulatory Visit: Payer: Self-pay | Admitting: Family

## 2017-11-13 ENCOUNTER — Other Ambulatory Visit: Payer: Self-pay | Admitting: *Deleted

## 2017-11-13 DIAGNOSIS — E039 Hypothyroidism, unspecified: Secondary | ICD-10-CM | POA: Insufficient documentation

## 2017-11-13 DIAGNOSIS — E559 Vitamin D deficiency, unspecified: Secondary | ICD-10-CM

## 2017-11-13 DIAGNOSIS — E785 Hyperlipidemia, unspecified: Secondary | ICD-10-CM | POA: Insufficient documentation

## 2017-11-13 MED ORDER — VITAMIN D (ERGOCALCIFEROL) 1.25 MG (50000 UNIT) PO CAPS: 50000.0000 [IU] | ORAL_CAPSULE | ORAL | 3 refills | Status: DC

## 2017-11-13 MED ORDER — LEVOTHYROXINE SODIUM 50 MCG PO TABS: 50.0000 ug | ORAL_TABLET | Freq: Every day | ORAL | 1 refills | Status: DC

## 2017-11-13 MED ORDER — FLUCONAZOLE 150 MG PO TABS: 150.0000 mg | ORAL_TABLET | Freq: Once | ORAL | 1 refills | Status: AC

## 2017-12-01 ENCOUNTER — Encounter: Payer: Self-pay | Admitting: Physician Assistant

## 2017-12-01 ENCOUNTER — Ambulatory Visit (INDEPENDENT_AMBULATORY_CARE_PROVIDER_SITE_OTHER): Payer: 59 | Admitting: Physician Assistant

## 2017-12-01 VITALS — BP 102/63 | HR 82 | Temp 98.7°F | Ht 67.0 in | Wt 167.8 lb

## 2017-12-01 DIAGNOSIS — L239 Allergic contact dermatitis, unspecified cause: Secondary | ICD-10-CM | POA: Diagnosis not present

## 2017-12-01 MED ORDER — BETAMETHASONE SOD PHOS & ACET 6 (3-3) MG/ML IJ SUSP
6.0000 mg | Freq: Once | INTRAMUSCULAR | Status: AC
  Administered 2017-12-01: 6 mg via INTRAMUSCULAR

## 2017-12-01 MED ORDER — LORATADINE 10 MG PO TABS: 10.0000 mg | ORAL_TABLET | Freq: Every day | ORAL | 11 refills | Status: DC

## 2017-12-01 MED ORDER — METHYLPREDNISOLONE ACETATE 80 MG/ML IJ SUSP: 80.0000 mg | Freq: Once | INTRAMUSCULAR | Status: DC

## 2017-12-01 NOTE — Patient Instructions (Signed)
Contact Dermatitis Dermatitis is redness, soreness, and swelling (inflammation) of the skin. Contact dermatitis is a reaction to certain substances that touch the skin. You either touched something that irritated your skin, or you have allergies to something you touched. Follow these instructions at home: Skin Care  Moisturize your skin as needed.  Apply cool compresses to the affected areas.  Try taking a bath with: ? Epsom salts. Follow the instructions on the package. You can get these at a pharmacy or grocery store. ? Baking soda. Pour a small amount into the bath as told by your doctor. ? Colloidal oatmeal. Follow the instructions on the package. You can get this at a pharmacy or grocery store.  Try applying baking soda paste to your skin. Stir water into baking soda until it looks like paste.  Do not scratch your skin.  Bathe less often.  Bathe in lukewarm water. Avoid using hot water. Medicines  Take or apply over-the-counter and prescription medicines only as told by your doctor.  If you were prescribed an antibiotic medicine, take or apply your antibiotic as told by your doctor. Do not stop taking the antibiotic even if your condition starts to get better. General instructions  Keep all follow-up visits as told by your doctor. This is important.  Avoid the substance that caused your reaction. If you do not know what caused it, keep a journal to try to track what caused it. Write down: ? What you eat. ? What cosmetic products you use. ? What you drink. ? What you wear in the affected area. This includes jewelry.  If you were given a bandage (dressing), take care of it as told by your doctor. This includes when to change and remove it. Contact a doctor if:  You do not get better with treatment.  Your condition gets worse.  You have signs of infection such as: ? Swelling. ? Tenderness. ? Redness. ? Soreness. ? Warmth.  You have a fever.  You have new  symptoms. Get help right away if:  You have a very bad headache.  You have neck pain.  Your neck is stiff.  You throw up (vomit).  You feel very sleepy.  You see red streaks coming from the affected area.  Your bone or joint underneath the affected area becomes painful after the skin has healed.  The affected area turns darker.  You have trouble breathing. This information is not intended to replace advice given to you by your health care provider. Make sure you discuss any questions you have with your health care provider. Document Released: 06/02/2009 Document Revised: 01/11/2016 Document Reviewed: 12/21/2014 Elsevier Interactive Patient Education  2018 Elsevier Inc.  

## 2017-12-01 NOTE — Progress Notes (Signed)
BP 102/63   Pulse 82   Temp 98.7 F (37.1 C) (Oral)   Ht 5\' 7"  (1.702 m)   Wt 167 lb 12.8 oz (76.1 kg)   BMI 26.28 kg/m    Subjective:    Patient ID: Katherine Hays, female    DOB: [birth year], 47 y.o.   MRN: [id number]  HPI: Katherine Hays is a 47 y.o. female presenting on 12/01/2017 for Rash  Two d the patient has had a new medication of Levoxyl in the past 2 weeks.  She has never taken this in the past.  I encouraged her to hold itays of erythema and scaling on arms and trunk. Itching is quite severe at times. No medications tried.  For about a week.  We will try to get the rash better and then she can resume the medicine to see if it was the calls of the rash.  She has slept in a hotel room.  No one else in her family has a rash like hers.  She denies any new products like soaps or lotions.  Past Medical History:  Diagnosis Date  . Anxiety   . Asthma   . OCD (obsessive compulsive disorder)    Relevant past medical, surgical, family and social history reviewed and updated as indicated. Interim medical history since our last visit reviewed. Allergies and medications reviewed and updated. DATA REVIEWED: CHART IN EPIC  Family History reviewed for pertinent findings.  Review of Systems  Constitutional: Negative.   HENT: Negative.   Eyes: Negative.   Respiratory: Negative.   Gastrointestinal: Negative.   Genitourinary: Negative.   Skin: Positive for color change and rash.    Allergies as of 12/01/2017      Reactions   Codeine       Medication List        Accurate as of 12/01/17  1:35 PM. Always use your most recent med list.          ALPRAZolam 0.5 MG tablet Commonly known as:  XANAX TAKE  (1)  TABLET TWICE A DAY.   levonorgestrel 20 MCG/24HR IUD Commonly known as:  MIRENA 1 each by Intrauterine route once.   levothyroxine 50 MCG tablet Commonly known as:  SYNTHROID Take 1 tablet (50 mcg total) by mouth daily before breakfast.   loratadine 10 MG  tablet Commonly known as:  CLARITIN Take 1 tablet (10 mg total) by mouth daily.   pantoprazole 20 MG tablet Commonly known as:  PROTONIX Take 1 tablet (20 mg total) by mouth daily.   sertraline 100 MG tablet Commonly known as:  ZOLOFT Take 1 tablet (100 mg total) by mouth daily.   Vitamin D (Ergocalciferol) 50000 units Caps capsule Commonly known as:  DRISDOL Take 1 capsule (50,000 Units total) by mouth every 7 (seven) days.          Objective:    BP 102/63   Pulse 82   Temp 98.7 F (37.1 C) (Oral)   Ht 5\' 7"  (1.702 m)   Wt 167 lb 12.8 oz (76.1 kg)   BMI 26.28 kg/m   Allergies  Allergen Reactions  . Codeine     Wt Readings from Last 3 Encounters:  12/01/17 167 lb 12.8 oz (76.1 kg)  11/10/17 168 lb 12.8 oz (76.6 kg)  07/18/17 166 lb (75.3 kg)    Physical Exam  Constitutional: She is oriented to person, place, and time. She appears well-developed and well-nourished.  HENT:  Head: Normocephalic and atraumatic.  Eyes:  Pupils are equal, round, and reactive to light. Conjunctivae and EOM are normal.  Cardiovascular: Normal rate, regular rhythm, normal heart sounds and intact distal pulses.  Pulmonary/Chest: Effort normal and breath sounds normal.  Abdominal: Soft. Bowel sounds are normal.  Neurological: She is alert and oriented to person, place, and time. She has normal reflexes.  Skin: Skin is warm and dry. Lesion and rash noted. Rash is macular and papular. There is erythema.     Psychiatric: She has a normal mood and affect. Her behavior is normal. Judgment and thought content normal.        Assessment & Plan:   1. Allergic dermatitis - loratadine (CLARITIN) 10 MG tablet; Take 1 tablet (10 mg total) by mouth daily.  Dispense: 30 tablet; Refill: 11 - betamethasone acetate-betamethasone sodium phosphate (CELESTONE) injection 6 mg   Continue all other maintenance medications as listed above.  Follow up plan: Return if symptoms worsen or fail to  improve.  Educational handout given for [place] PA-C [place] [address]  [place], [place] [address] [phone]   12/01/2017, 1:35 PM

## 2017-12-17 ENCOUNTER — Other Ambulatory Visit: Payer: 59

## 2018-01-13 ENCOUNTER — Other Ambulatory Visit: Payer: 59

## 2018-01-13 ENCOUNTER — Other Ambulatory Visit: Payer: Self-pay | Admitting: Family

## 2018-01-13 DIAGNOSIS — E039 Hypothyroidism, unspecified: Secondary | ICD-10-CM

## 2018-01-14 LAB — TSH: TSH: 1.07 u[IU]/mL (ref 0.450–4.500)

## 2018-03-12 ENCOUNTER — Ambulatory Visit (INDEPENDENT_AMBULATORY_CARE_PROVIDER_SITE_OTHER): Payer: 59 | Admitting: Family

## 2018-03-12 ENCOUNTER — Encounter: Payer: Self-pay | Admitting: Family

## 2018-03-12 VITALS — BP 105/63 | HR 92 | Temp 97.7°F | Ht 67.0 in | Wt 167.0 lb

## 2018-03-12 DIAGNOSIS — N3001 Acute cystitis with hematuria: Secondary | ICD-10-CM | POA: Diagnosis not present

## 2018-03-12 DIAGNOSIS — R1030 Lower abdominal pain, unspecified: Secondary | ICD-10-CM | POA: Diagnosis not present

## 2018-03-12 LAB — MICROSCOPIC EXAMINATION: RENAL EPITHEL UA: NONE SEEN /HPF

## 2018-03-12 LAB — URINALYSIS, COMPLETE
Bilirubin, UA: NEGATIVE
Glucose, UA: NEGATIVE
Ketones, UA: NEGATIVE
NITRITE UA: NEGATIVE
PH UA: 6 (ref 5.0–7.5)
Protein, UA: NEGATIVE
SPEC GRAV UA: 1.015 (ref 1.005–1.030)
Urobilinogen, Ur: 0.2 mg/dL (ref 0.2–1.0)

## 2018-03-12 MED ORDER — SULFAMETHOXAZOLE-TRIMETHOPRIM 800-160 MG PO TABS: 1.0000 | ORAL_TABLET | Freq: Two times a day (BID) | ORAL | 0 refills | Status: DC

## 2018-03-12 NOTE — Progress Notes (Signed)
   Subjective:    Patient ID: Katherine Hays, female    DOB: 1971-01-14, 47 y.o.   MRN: [id number]  Chief Complaint  Patient presents with  . pain in lower abdominal    Dysuria   This is a new problem. The current episode started yesterday. The problem occurs intermittently. The problem has been waxing and waning. The pain is at a severity of 1/10. Associated symptoms include urgency. Pertinent negatives include no discharge, flank pain, frequency, hematuria, nausea or vomiting. She has tried increased fluids for the symptoms. The treatment provided no relief.  Abdominal Pain  This is a new problem. The current episode started yesterday. The problem has been unchanged. The pain is located in the suprapubic region. The pain is at a severity of 3/10. The pain is mild. The quality of the pain is cramping. Associated symptoms include dysuria. Pertinent negatives include no frequency, hematuria, nausea or vomiting.      Review of Systems  Gastrointestinal: Positive for abdominal pain. Negative for nausea and vomiting.  Genitourinary: Positive for dysuria and urgency. Negative for flank pain, frequency and hematuria.  All other systems reviewed and are negative.      Objective:   Physical Exam  Constitutional: She is oriented to person, place, and time. She appears well-developed and well-nourished. No distress.  HENT:  Head: Normocephalic and atraumatic.  Right Ear: External ear normal.  Mouth/Throat: Oropharynx is clear and moist.  Eyes: Pupils are equal, round, and reactive to light.  Neck: Normal range of motion. Neck supple. No thyromegaly present.  Cardiovascular: Normal rate, regular rhythm, normal heart sounds and intact distal pulses.  No murmur heard. Pulmonary/Chest: Effort normal and breath sounds normal. No respiratory distress. She has no wheezes.  Abdominal: Soft. Bowel sounds are normal. She exhibits no distension. There is tenderness (milld LLQ).  Musculoskeletal:  Normal range of motion. She exhibits no edema or tenderness.  Neurological: She is alert and oriented to person, place, and time. She has normal reflexes. No cranial nerve deficit.  Skin: Skin is warm and dry.  Psychiatric: She has a normal mood and affect. Her behavior is normal. Judgment and thought content normal.  Vitals reviewed.     BP 105/63   Pulse 92   Temp 97.7 F (36.5 C) (Oral)   Ht 5\' 7"  (1.702 m)   Wt 167 lb (75.8 kg)   BMI 26.16 kg/m      Assessment & Plan:  1. Lower abdominal pain - Urinalysis, Complete  2. Acute cystitis with hematuria Force fluids AZO over the counter X2 days RTO prn Culture pending - Urine Culture - sulfamethoxazole-trimethoprim (BACTRIM DS) 800-160 MG tablet; Take 1 tablet by mouth 2 (two) times daily.  Dispense: 14 tablet; Refill: 0   Evelina Dun, FNP

## 2018-03-12 NOTE — Patient Instructions (Signed)

## 2018-03-13 LAB — URINE CULTURE

## 2018-03-16 ENCOUNTER — Other Ambulatory Visit: Payer: Self-pay | Admitting: *Deleted

## 2018-03-16 DIAGNOSIS — Z1231 Encounter for screening mammogram for malignant neoplasm of breast: Secondary | ICD-10-CM | POA: Diagnosis not present

## 2018-03-16 LAB — HM MAMMOGRAPHY

## 2018-03-16 MED ORDER — FLUCONAZOLE 150 MG PO TABS: 150.0000 mg | ORAL_TABLET | Freq: Once | ORAL | 1 refills | Status: AC

## 2018-05-22 ENCOUNTER — Ambulatory Visit: Payer: 59 | Admitting: Family

## 2018-05-29 DIAGNOSIS — H524 Presbyopia: Secondary | ICD-10-CM | POA: Diagnosis not present

## 2018-05-29 DIAGNOSIS — H5213 Myopia, bilateral: Secondary | ICD-10-CM | POA: Diagnosis not present

## 2018-07-08 ENCOUNTER — Other Ambulatory Visit: Payer: Self-pay

## 2018-07-08 DIAGNOSIS — F5101 Primary insomnia: Secondary | ICD-10-CM

## 2018-07-08 DIAGNOSIS — Z8659 Personal history of other mental and behavioral disorders: Secondary | ICD-10-CM

## 2018-07-08 DIAGNOSIS — E039 Hypothyroidism, unspecified: Secondary | ICD-10-CM

## 2018-07-08 DIAGNOSIS — F411 Generalized anxiety disorder: Secondary | ICD-10-CM

## 2018-07-08 MED ORDER — ALPRAZOLAM 0.5 MG PO TABS: ORAL_TABLET | ORAL | 5 refills | Status: DC

## 2018-07-08 MED ORDER — LEVOTHYROXINE SODIUM 50 MCG PO TABS: 50.0000 ug | ORAL_TABLET | Freq: Every day | ORAL | 1 refills | Status: DC

## 2018-07-08 MED ORDER — SERTRALINE HCL 100 MG PO TABS: 100.0000 mg | ORAL_TABLET | Freq: Every day | ORAL | 1 refills | Status: DC

## 2018-07-08 MED FILL — LEVOTHYROXINE 50 MCG TABLET: 50 | 90 days supply | Qty: 90 | Fill #0

## 2018-07-08 MED FILL — SHIPPING COST: 1 days supply | Qty: 1 | Fill #1

## 2018-07-08 MED FILL — SERTRALINE HCL 100 MG TAB: 100 | 90 days supply | Qty: 90 | Fill #0

## 2018-07-08 MED FILL — ALPRAZolam 0.5 MG TABS: 0.5 | 30 days supply | Qty: 60 | Fill #0

## 2018-07-08 NOTE — Telephone Encounter (Signed)
Patient requesting that rx be sent to [facility]. She is changing over from [facility]. Please send. Thank you

## 2018-07-09 ENCOUNTER — Other Ambulatory Visit: Payer: Self-pay | Admitting: Family

## 2018-07-09 MED ORDER — FLUCONAZOLE 150 MG PO TABS: 150.0000 mg | ORAL_TABLET | ORAL | 0 refills | Status: DC | PRN

## 2018-07-21 ENCOUNTER — Encounter: Payer: Self-pay | Admitting: Nurse Practitioner

## 2018-07-21 ENCOUNTER — Ambulatory Visit (INDEPENDENT_AMBULATORY_CARE_PROVIDER_SITE_OTHER): Payer: 59 | Admitting: Nurse Practitioner

## 2018-07-21 VITALS — BP 105/67 | HR 74 | Temp 97.2°F | Ht 67.0 in | Wt 167.0 lb

## 2018-07-21 DIAGNOSIS — L2081 Atopic neurodermatitis: Secondary | ICD-10-CM

## 2018-07-21 MED ORDER — CLOTRIMAZOLE-BETAMETHASONE 1-0.05 % EX CREA: 1.0000 "application " | TOPICAL_CREAM | Freq: Two times a day (BID) | CUTANEOUS | 0 refills | Status: DC

## 2018-07-21 NOTE — Progress Notes (Signed)
   Subjective:    Patient ID: Katherine Hays, female    DOB: [birth year], 47 y.o.   MRN: [id number]   Chief Complaint: Rash on breasts   HPI Patient come sin today c/o rash on breasts. Started several months ago. Gets better then returns. Very itchy.   Review of Systems  Respiratory: Negative.   Cardiovascular: Negative.   Skin: Positive for rash.  Neurological: Negative.   Psychiatric/Behavioral: Negative.   All other systems reviewed and are negative.      Objective:   Physical Exam  Constitutional: She is oriented to person, place, and time. She appears well-developed and well-nourished. No distress.  Cardiovascular: Normal rate and regular rhythm.  Pulmonary/Chest: Effort normal.  Neurological: She is alert and oriented to person, place, and time.  Skin: Skin is warm.  Maculo(papular dry rash on bil breast- appears to have been scratched.  Psychiatric: She has a normal mood and affect. Her behavior is normal. Thought content normal.  Nursing note and vitals reviewed.  Temp (!) 97.2 F (36.2 C) (Oral)   Ht 5\' 7"  (1.702 m)   Wt 167 lb (75.8 kg)   BMI 26.16 kg/m       Assessment & Plan:  Katherine Hays in today with chief complaint of Rash on breasts   1. Atopic neurodermatitis Avoid scratching Avoid lotions or powders - clotrimazole-betamethasone (LOTRISONE) cream; Apply 1 application topically 2 (two) times daily.  Dispense: 30 g; Refill: 0  Mary-Margaret Hassell Done, FNP

## 2018-09-18 ENCOUNTER — Ambulatory Visit (INDEPENDENT_AMBULATORY_CARE_PROVIDER_SITE_OTHER): Payer: No Typology Code available for payment source | Admitting: Family Medicine

## 2018-09-18 ENCOUNTER — Encounter: Payer: Self-pay | Admitting: Family Medicine

## 2018-09-18 VITALS — BP 113/70 | HR 77 | Temp 98.2°F | Ht 67.0 in | Wt 174.1 lb

## 2018-09-18 DIAGNOSIS — R52 Pain, unspecified: Secondary | ICD-10-CM

## 2018-09-18 DIAGNOSIS — J111 Influenza due to unidentified influenza virus with other respiratory manifestations: Secondary | ICD-10-CM | POA: Diagnosis not present

## 2018-09-18 LAB — VERITOR FLU A/B WAIVED
INFLUENZA A: POSITIVE — AB
Influenza B: NEGATIVE

## 2018-09-18 MED ORDER — OSELTAMIVIR PHOSPHATE 75 MG PO CAPS: 75.0000 mg | ORAL_CAPSULE | Freq: Two times a day (BID) | ORAL | 0 refills | Status: DC

## 2018-09-18 NOTE — Progress Notes (Signed)
Chief Complaint  Patient presents with  . chest congestion    pt here today c/o sinus drainage, congestion    HPI  Patient presents today for patient presents with h runny stuffy nose. Facial headache of moderate intensity. Patient also has  subjective fever. Body aches also Decreased energy.  Onset yesterday.  PMH: Smoking status noted ROS: Per HPI  Objective: BP 113/70   Pulse 77   Temp 98.2 F (36.8 C) (Oral)   Ht 5\' 7"  (1.702 m)   Wt 174 lb 2 oz (79 kg)   BMI 27.27 kg/m  Gen: NAD, alert, cooperative with exam HEENT: NCAT, EOMI, PERRL. Nsaal passages swollen, red. CV: RRR, good S1/S2, no murmur Resp: CTABL, no wheezes, non-labored Abd: SNTND, BS present, no guarding or organomegaly Ext: No edema, warm Neuro: Alert and oriented, No gross deficits Influenza test positive for A Assessment and plan:  1. Influenza with respiratory manifestation   2. Body aches     Meds ordered this encounter  Medications  . oseltamivir (TAMIFLU) 75 MG capsule    Sig: Take 1 capsule (75 mg total) by mouth 2 (two) times daily.    Dispense:  10 capsule    Refill:  0    Orders Placed This Encounter  Procedures  . Veritor Flu A/B Waived    Order Specific Question:   Source    Answer:   nasal    Follow up as needed.  Claretta Fraise, MD

## 2018-09-25 ENCOUNTER — Other Ambulatory Visit: Payer: No Typology Code available for payment source

## 2018-09-28 ENCOUNTER — Encounter: Payer: Self-pay | Admitting: Family Medicine

## 2018-09-28 ENCOUNTER — Ambulatory Visit (INDEPENDENT_AMBULATORY_CARE_PROVIDER_SITE_OTHER): Payer: No Typology Code available for payment source | Admitting: Family Medicine

## 2018-09-28 VITALS — BP 90/61 | HR 78 | Temp 98.6°F | Ht 67.0 in | Wt 172.0 lb

## 2018-09-28 DIAGNOSIS — J069 Acute upper respiratory infection, unspecified: Secondary | ICD-10-CM | POA: Diagnosis not present

## 2018-09-28 MED ORDER — PSEUDOEPH-BROMPHEN-DM 30-2-10 MG/5ML PO SYRP: 5.0000 mL | ORAL_SOLUTION | Freq: Four times a day (QID) | ORAL | 0 refills | Status: DC | PRN

## 2018-09-28 NOTE — Patient Instructions (Signed)

## 2018-09-28 NOTE — Progress Notes (Signed)
Subjective:  Patient ID: Katherine Hays, female    DOB: 31-May-1971, 48 y.o.   MRN: [id number]  Chief Complaint:  Chest congestion, cough (had flu last week)   HPI: Katherine Hays is a 48 y.o. female presenting on 09/28/2018 for Chest congestion, cough (had flu last week)   1. URI with cough and congestion   Pt presents today with complaints of cough and chest congestion. Pt states she had influenza last week. States she is now coughing and congested. States she has been taking Mucinex without relief of symptoms. She denies fever or chills. No shortness of breath or chest pain.    Relevant past medical, surgical, family, and social history reviewed and updated as indicated.  Allergies and medications reviewed and updated.   Past Medical History:  Diagnosis Date  . Anxiety   . Asthma   . OCD (obsessive compulsive disorder)     Past Surgical History:  Procedure Laterality Date  . TONSILLECTOMY    . TUBAL LIGATION    . WISDOM TOOTH EXTRACTION      Social History   Socioeconomic History  . Marital status: Married    Spouse name: Not on file  . Number of children: 3  . Years of education: Not on file  . Highest education level: Not on file  Occupational History  . Occupation: Licensed conveyancer: [employer]  . Financial resource strain: Not on file  . Food insecurity:    Worry: Not on file    Inability: Not on file  . Transportation needs:    Medical: Not on file    Non-medical: Not on file  Tobacco Use  . Smoking status: Never Smoker  . Smokeless tobacco: Never Used  Substance and Sexual Activity  . Alcohol use: No  . Drug use: No  . Sexual activity: Yes  Lifestyle  . Physical activity:    Days per week: Not on file    Minutes per session: Not on file  . Stress: Not on file  Relationships  . Social connections:    Talks on phone: Not on file    Gets together: Not on file    Attends religious service: Not on file    Active  member of club or organization: Not on file    Attends meetings of clubs or organizations: Not on file    Relationship status: Not on file  . Intimate partner violence:    Fear of current or ex partner: Not on file    Emotionally abused: Not on file    Physically abused: Not on file    Forced sexual activity: Not on file  Other Topics Concern  . Not on file  Social History Narrative  . Not on file    Outpatient Encounter Medications as of 09/28/2018  Medication Sig  . ALPRAZolam (XANAX) 0.5 MG tablet TAKE  (1)  TABLET TWICE A DAY.  Marland Kitchen levonorgestrel (MIRENA) 20 MCG/24HR IUD 1 each by Intrauterine route once.  Marland Kitchen levothyroxine (SYNTHROID) 50 MCG tablet Take 1 tablet (50 mcg total) by mouth daily before breakfast.  . pantoprazole (PROTONIX) 20 MG tablet Take 20 mg by mouth daily.  . sertraline (ZOLOFT) 100 MG tablet Take 1 tablet (100 mg total) by mouth daily.  . brompheniramine-pseudoephedrine-DM 30-2-10 MG/5ML syrup Take 5 mLs by mouth 4 (four) times daily as needed.  . [DISCONTINUED] oseltamivir (TAMIFLU) 75 MG capsule Take 1 capsule (75 mg total) by mouth  2 (two) times daily.   No facility-administered encounter medications on file as of 09/28/2018.     Allergies  Allergen Reactions  . Codeine     Review of Systems  Constitutional: Positive for fatigue. Negative for chills and fever.  HENT: Positive for congestion, rhinorrhea and sore throat. Negative for postnasal drip, sinus pressure, sinus pain, sneezing and trouble swallowing.   Respiratory: Positive for cough. Negative for chest tightness and shortness of breath.   Cardiovascular: Negative for chest pain, palpitations and leg swelling.  Gastrointestinal: Negative for abdominal distention, constipation, diarrhea, nausea and vomiting.  Musculoskeletal: Negative for arthralgias and myalgias.  Neurological: Positive for headaches. Negative for dizziness, weakness and light-headedness.  Psychiatric/Behavioral: Negative for  confusion.  All other systems reviewed and are negative.       Objective:  BP 90/61   Pulse 78   Temp 98.6 F (37 C) (Oral)   Ht 5\' 7"  (1.702 m)   Wt 172 lb (78 kg)   SpO2 98%   BMI 26.94 kg/m    Wt Readings from Last 3 Encounters:  09/28/18 172 lb (78 kg)  09/18/18 174 lb 2 oz (79 kg)  07/21/18 167 lb (75.8 kg)    Physical Exam Vitals signs and nursing note reviewed.  Constitutional:      General: She is not in acute distress.    Appearance: Normal appearance. She is well-developed and well-groomed. She is not ill-appearing or toxic-appearing.  HENT:     Head: Normocephalic and atraumatic.     Right Ear: Hearing, tympanic membrane, ear canal and external ear normal.     Left Ear: Hearing, tympanic membrane, ear canal and external ear normal.     Nose: Congestion and rhinorrhea present. Rhinorrhea is clear.     Right Turbinates: Swollen.     Left Turbinates: Swollen.     Right Sinus: No maxillary sinus tenderness or frontal sinus tenderness.     Left Sinus: No maxillary sinus tenderness or frontal sinus tenderness.     Mouth/Throat:     Lips: Pink.     Mouth: Mucous membranes are moist.     Pharynx: Oropharynx is clear. Posterior oropharyngeal erythema present. No oropharyngeal exudate.  Eyes:     General: Lids are normal.     Conjunctiva/sclera: Conjunctivae normal.     Pupils: Pupils are equal, round, and reactive to light.  Neck:     Musculoskeletal: Normal range of motion and neck supple.  Cardiovascular:     Rate and Rhythm: Normal rate and regular rhythm.     Heart sounds: Normal heart sounds. No murmur. No friction rub. No gallop.   Pulmonary:     Effort: Pulmonary effort is normal. No respiratory distress.     Breath sounds: Normal breath sounds.  Skin:    General: Skin is warm and dry.     Capillary Refill: Capillary refill takes less than 2 seconds.  Neurological:     General: No focal deficit present.     Mental Status: She is alert and oriented to  person, place, and time.  Psychiatric:        Mood and Affect: Mood normal.        Behavior: Behavior normal. Behavior is cooperative.        Thought Content: Thought content normal.        Judgment: Judgment normal.     Results for orders placed or performed in visit on 09/18/18  Veritor Flu A/B Waived  Result Value Ref Range  Influenza A Positive (A) Negative   Influenza B Negative Negative       Pertinent labs & imaging results that were available during my care of the patient were reviewed by me and considered in my medical decision making.  Assessment & Plan:  Carsen was seen today for chest congestion, cough.  Diagnoses and all orders for this visit:  URI with cough and congestion Increase fluid intake and humidity in the air. Medications as prescribed. Report any new or worsening symptoms.  -     brompheniramine-pseudoephedrine-DM 30-2-10 MG/5ML syrup; Take 5 mLs by mouth 4 (four) times daily as needed.     Continue all other maintenance medications.  Follow up plan: Return if symptoms worsen or fail to improve.  Educational handout given for URI  The above assessment and management plan was discussed with the patient. The patient verbalized understanding of and has agreed to the management plan. Patient is aware to call the clinic if symptoms persist or worsen. Patient is aware when to return to the clinic for a follow-up visit. Patient educated on when it is appropriate to go to the emergency department.   Monia Pouch, FNP-C [facility] [phone]

## 2018-10-19 MED FILL — SERTRALINE HCL 100 MG TAB: 100 | 90 days supply | Qty: 90 | Fill #1

## 2018-10-19 MED FILL — ALPRAZolam 0.5 MG TABS: 0.5 | 30 days supply | Qty: 60 | Fill #1

## 2018-10-19 MED FILL — SHIPPING COST: 1 days supply | Qty: 1 | Fill #0

## 2018-11-02 ENCOUNTER — Ambulatory Visit (INDEPENDENT_AMBULATORY_CARE_PROVIDER_SITE_OTHER): Payer: No Typology Code available for payment source | Admitting: Nurse Practitioner

## 2018-11-02 ENCOUNTER — Encounter: Payer: Self-pay | Admitting: Nurse Practitioner

## 2018-11-02 ENCOUNTER — Other Ambulatory Visit: Payer: Self-pay

## 2018-11-02 VITALS — BP 109/66 | HR 82 | Temp 96.8°F | Ht 67.0 in | Wt 171.0 lb

## 2018-11-02 DIAGNOSIS — D367 Benign neoplasm of other specified sites: Secondary | ICD-10-CM

## 2018-11-02 NOTE — Progress Notes (Signed)
   Subjective:    Patient ID: Katherine Hays, female    DOB: [birth year], 48 y.o.   MRN: [id number]   Chief Complaint: removal of area to leg   HPI Patient has a small superficial skin lesion on lateral side of left thigh she wants removed. It has not changed in size , she just does not want it there.   Review of Systems  All other systems reviewed and are negative.       Objective:   Physical Exam Vitals signs reviewed.  Constitutional:      Appearance: Normal appearance.  Cardiovascular:     Rate and Rhythm: Normal rate and regular rhythm.     Heart sounds: Normal heart sounds.  Pulmonary:     Effort: Pulmonary effort is normal.     Breath sounds: Normal breath sounds.  Skin:    General: Skin is warm.     Comments: 1 cm nontender cystic lesion on left lateral thigh  Neurological:     General: No focal deficit present.     Mental Status: She is alert and oriented to person, place, and time.  Psychiatric:        Mood and Affect: Mood normal.        Behavior: Behavior normal.    BP 109/66   Pulse 82   Temp (!) 96.8 F (36 C) (Oral)   Ht 5\' 7"  (1.702 m)   Wt 171 lb (77.6 kg)   BMI 26.78 kg/m   I & D Date/Time: 11/02/2018 2:30 PM Performed by: Chevis Pretty, FNP Authorized by: Chevis Pretty, FNP   Consent:    Consent obtained:  Verbal   Consent given by:  Patient   Risks discussed:  Bleeding and infection   Alternatives discussed:  No treatment Location:    Type:  Cyst   Size:  1cm   Location:  Lower extremity   Lower extremity location:  Leg   Leg location:  L upper leg Pre-procedure details:    Skin preparation:  Betadine Anesthesia (see MAR for exact dosages):    Anesthesia method:  Local infiltration   Local anesthetic:  Lidocaine 2% WITH epi Procedure type:    Complexity:  Simple Procedure details:    Needle aspiration: no     Incision types:  Elliptical   Incision depth:  Subcutaneous   Scalpel blade:  15   Wound  management:  Irrigated with saline   Packing materials:  None Post-procedure details:    Patient tolerance of procedure:  Tolerated well, no immediate complications   BP 573/22   Pulse 82   Temp (!) 96.8 F (36 C) (Oral)   Ht 5\' 7"  (1.702 m)   Wt 171 lb (77.6 kg)   BMI 26.78 kg/m        Assessment & Plan:  Katherine Hays in today with chief complaint of removal of area to leg   1. Dermoid cyst of left lower extremity Removal of subcutaneous csyt left thigh Wound care discussed Stitch removal in 10 days.  Mary-Margaret Hassell Done, FNP

## 2018-11-02 NOTE — Patient Instructions (Signed)
Wound Care, Adult  Taking care of your wound properly can help to prevent pain, infection, and scarring. It can also help your wound to heal more quickly.  How to care for your wound  Wound care          Follow instructions from your health care provider about how to take care of your wound. Make sure you:  ? Wash your hands with soap and water before you change the bandage (dressing). If soap and water are not available, use hand sanitizer.  ? Change your dressing as told by your health care provider.  ? Leave stitches (sutures), skin glue, or adhesive strips in place. These skin closures may need to stay in place for 2 weeks or longer. If adhesive strip edges start to loosen and curl up, you may trim the loose edges. Do not remove adhesive strips completely unless your health care provider tells you to do that.   Check your wound area every day for signs of infection. Check for:  ? Redness, swelling, or pain.  ? Fluid or blood.  ? Warmth.  ? Pus or a bad smell.   Ask your health care provider if you should clean the wound with mild soap and water. Doing this may include:  ? Using a clean towel to pat the wound dry after cleaning it. Do not rub or scrub the wound.  ? Applying a cream or ointment. Do this only as told by your health care provider.  ? Covering the incision with a clean dressing.   Ask your health care provider when you can leave the wound uncovered.   Keep the dressing dry until your health care provider says it can be removed. Do not take baths, swim, use a hot tub, or do anything that would put the wound underwater until your health care provider approves. Ask your health care provider if you can take showers. You may only be allowed to take sponge baths.  Medicines     If you were prescribed an antibiotic medicine, cream, or ointment, take or use the antibiotic as told by your health care provider. Do not stop taking or using the antibiotic even if your condition improves.   Take  over-the-counter and prescription medicines only as told by your health care provider. If you were prescribed pain medicine, take it 30 or more minutes before you do any wound care or as told by your health care provider.  General instructions   Return to your normal activities as told by your health care provider. Ask your health care provider what activities are safe.   Do not scratch or pick at the wound.   Do not use any products that contain nicotine or tobacco, such as cigarettes and e-cigarettes. These may delay wound healing. If you need help quitting, ask your health care provider.   Keep all follow-up visits as told by your health care provider. This is important.   Eat a diet that includes protein, vitamin A, vitamin C, and other nutrient-rich foods to help the wound heal.  ? Foods rich in protein include meat, dairy, beans, nuts, and other sources.  ? Foods rich in vitamin A include carrots and dark green, leafy vegetables.  ? Foods rich in vitamin C include citrus, tomatoes, and other fruits and vegetables.  ? Nutrient-rich foods have protein, carbohydrates, fat, vitamins, or minerals. Eat a variety of healthy foods including vegetables, fruits, and whole grains.  Contact a health care provider if:     You received a tetanus shot and you have swelling, severe pain, redness, or bleeding at the injection site.   Your pain is not controlled with medicine.   You have redness, swelling, or pain around the wound.   You have fluid or blood coming from the wound.   Your wound feels warm to the touch.   You have pus or a bad smell coming from the wound.   You have a fever or chills.   You are nauseous or you vomit.   You are dizzy.  Get help right away if:   You have a red streak going away from your wound.   The edges of the wound open up and separate.   Your wound is bleeding, and the bleeding does not stop with gentle pressure.   You have a rash.   You faint.   You have trouble  breathing.  Summary   Always wash your hands with soap and water before changing your bandage (dressing).   To help with healing, eat foods that are rich in protein, vitamin A, vitamin C, and other nutrients.   Check your wound every day for signs of infection. Contact your health care provider if you suspect that your wound is infected.  This information is not intended to replace advice given to you by your health care provider. Make sure you discuss any questions you have with your health care provider.  Document Released: 05/14/2008 Document Revised: 09/16/2017 Document Reviewed: 02/20/2016  Elsevier Interactive Patient Education  2019 [employer]

## 2018-11-03 MED FILL — LEVOTHYROXINE 50 MCG TABLET: 50 | 90 days supply | Qty: 90 | Fill #1

## 2018-11-13 ENCOUNTER — Encounter: Payer: No Typology Code available for payment source | Admitting: Family

## 2018-12-11 MED FILL — ALPRAZolam 0.5 MG TABS: 0.5 | 30 days supply | Qty: 60 | Fill #2

## 2018-12-14 ENCOUNTER — Ambulatory Visit (INDEPENDENT_AMBULATORY_CARE_PROVIDER_SITE_OTHER): Payer: No Typology Code available for payment source | Admitting: Family Medicine

## 2018-12-14 ENCOUNTER — Other Ambulatory Visit: Payer: Self-pay

## 2018-12-14 ENCOUNTER — Encounter: Payer: Self-pay | Admitting: Family Medicine

## 2018-12-14 VITALS — BP 100/58 | HR 79 | Temp 98.8°F | Ht 67.0 in | Wt 156.0 lb

## 2018-12-14 DIAGNOSIS — R591 Generalized enlarged lymph nodes: Secondary | ICD-10-CM

## 2018-12-14 NOTE — Progress Notes (Signed)
Subjective:  Patient ID: Katherine Hays, female    DOB: 06-Jul-1971, 48 y.o.   MRN: [id number]  Chief Complaint:  Neck Pain   HPI: Katherine Hays is a 48 y.o. female presenting on 12/14/2018 for Neck Pain  Pt presents today with complaints of a swollen lymph node. Pt states 4-5 days ago she has a large ingrown hair in her occipital region. States the bump was very tender and did drain a moderate amount. Pt states she has now noticed a tender knot on her left neck. States this started 2 days ago. She denies fever, chills, weight loss, malaise, fatigue, or night sweats. No other associated symptoms.   Relevant past medical, surgical, family, and social history reviewed and updated as indicated.  Allergies and medications reviewed and updated.   Past Medical History:  Diagnosis Date  . Anxiety   . Asthma   . OCD (obsessive compulsive disorder)     Past Surgical History:  Procedure Laterality Date  . TONSILLECTOMY    . TUBAL LIGATION    . WISDOM TOOTH EXTRACTION      Social History   Socioeconomic History  . Marital status: Married    Spouse name: Not on file  . Number of children: 3  . Years of education: Not on file  . Highest education level: Not on file  Occupational History  . Occupation: Licensed conveyancer: [employer]  . Financial resource strain: Not on file  . Food insecurity:    Worry: Not on file    Inability: Not on file  . Transportation needs:    Medical: Not on file    Non-medical: Not on file  Tobacco Use  . Smoking status: Never Smoker  . Smokeless tobacco: Never Used  Substance and Sexual Activity  . Alcohol use: No  . Drug use: No  . Sexual activity: Yes  Lifestyle  . Physical activity:    Days per week: Not on file    Minutes per session: Not on file  . Stress: Not on file  Relationships  . Social connections:    Talks on phone: Not on file    Gets together: Not on file    Attends religious service: Not on  file    Active member of club or organization: Not on file    Attends meetings of clubs or organizations: Not on file    Relationship status: Not on file  . Intimate partner violence:    Fear of current or ex partner: Not on file    Emotionally abused: Not on file    Physically abused: Not on file    Forced sexual activity: Not on file  Other Topics Concern  . Not on file  Social History Narrative  . Not on file    Outpatient Encounter Medications as of 12/14/2018  Medication Sig  . ALPRAZolam (XANAX) 0.5 MG tablet TAKE  (1)  TABLET TWICE A DAY.  Marland Kitchen levonorgestrel (MIRENA) 20 MCG/24HR IUD 1 each by Intrauterine route once.  Marland Kitchen levothyroxine (SYNTHROID) 50 MCG tablet Take 1 tablet (50 mcg total) by mouth daily before breakfast.  . pantoprazole (PROTONIX) 20 MG tablet Take 20 mg by mouth daily.  . sertraline (ZOLOFT) 100 MG tablet Take 1 tablet (100 mg total) by mouth daily.  . [DISCONTINUED] brompheniramine-pseudoephedrine-DM 30-2-10 MG/5ML syrup Take 5 mLs by mouth 4 (four) times daily as needed.   No facility-administered encounter medications on file as of  12/14/2018.     Allergies  Allergen Reactions  . Codeine     Review of Systems  Constitutional: Negative for activity change, appetite change, chills, diaphoresis, fatigue, fever and unexpected weight change.  Respiratory: Negative for cough and shortness of breath.   Cardiovascular: Negative for chest pain and palpitations.  Musculoskeletal: Negative for arthralgias, myalgias, neck pain and neck stiffness.  Neurological: Negative for dizziness, weakness, light-headedness and headaches.  Hematological: Positive for adenopathy.  Psychiatric/Behavioral: Negative for confusion.  All other systems reviewed and are negative.       Objective:  BP (!) 100/58   Pulse 79   Temp 98.8 F (37.1 C) (Oral)   Ht 5\' 7"  (1.702 m)   Wt 156 lb (70.8 kg)   BMI 24.43 kg/m    Wt Readings from Last 3 Encounters:  12/14/18 156 lb  (70.8 kg)  11/02/18 171 lb (77.6 kg)  09/28/18 172 lb (78 kg)    Physical Exam Vitals signs and nursing note reviewed.  Constitutional:      General: She is not in acute distress.    Appearance: Normal appearance. She is well-developed and well-groomed. She is not ill-appearing or toxic-appearing.  HENT:     Head: Normocephalic and atraumatic.     Right Ear: Tympanic membrane, ear canal and external ear normal.     Left Ear: Tympanic membrane, ear canal and external ear normal.     Nose: Nose normal.     Mouth/Throat:     Mouth: Mucous membranes are moist.     Pharynx: Oropharynx is clear. No oropharyngeal exudate or posterior oropharyngeal erythema.  Eyes:     General: Lids are normal.     Conjunctiva/sclera: Conjunctivae normal.     Pupils: Pupils are equal, round, and reactive to light.  Neck:     Musculoskeletal: Full passive range of motion without pain and neck supple.     Thyroid: No thyroid mass, thyromegaly or thyroid tenderness.     Trachea: Trachea and phonation normal.   Cardiovascular:     Rate and Rhythm: Normal rate and regular rhythm.     Heart sounds: Normal heart sounds. No murmur. No friction rub. No gallop.   Pulmonary:     Effort: Pulmonary effort is normal. No respiratory distress.     Breath sounds: Normal breath sounds.  Lymphadenopathy:     Cervical: Cervical adenopathy present.  Skin:    General: Skin is warm and dry.     Capillary Refill: Capillary refill takes less than 2 seconds.  Neurological:     General: No focal deficit present.     Mental Status: She is alert and oriented to person, place, and time.  Psychiatric:        Mood and Affect: Mood normal.        Behavior: Behavior normal. Behavior is cooperative.        Thought Content: Thought content normal.        Judgment: Judgment normal.     Results for orders placed or performed in visit on 09/18/18  Veritor Flu A/B Waived  Result Value Ref Range   Influenza A Positive (A)  Negative   Influenza B Negative Negative       Pertinent labs & imaging results that were available during my care of the patient were reviewed by me and considered in my medical decision making.  Assessment & Plan:  [name] was seen today for neck pain.  Diagnoses and all orders for this visit:  Lymphadenopathy  Pt had ingrown hair above site of less than 1 cm posterior cervical node. No fever, weight loss, malaise, or fatigue.  Lymphadenopathy likely due to ingrown hair. Pt will monitor and report any new or worsening symptoms. Watchful waiting discussed in detail.     Continue all other maintenance medications.  Follow up plan: Return in about 2 weeks (around 12/28/2018), or if symptoms worsen or fail to improve, for lymphadenopathy.   The above assessment and management plan was discussed with the patient. The patient verbalized understanding of and has agreed to the management plan. Patient is aware to call the clinic if symptoms persist or worsen. Patient is aware when to return to the clinic for a follow-up visit. Patient educated on when it is appropriate to go to the emergency department.   Monia Pouch, FNP-C [facility] [phone]

## 2018-12-15 ENCOUNTER — Encounter: Payer: No Typology Code available for payment source | Admitting: Family

## 2019-01-28 ENCOUNTER — Other Ambulatory Visit: Payer: Self-pay | Admitting: Family

## 2019-01-28 DIAGNOSIS — F411 Generalized anxiety disorder: Secondary | ICD-10-CM

## 2019-01-28 DIAGNOSIS — Z8659 Personal history of other mental and behavioral disorders: Secondary | ICD-10-CM

## 2019-01-28 MED FILL — SERTRALINE HCL 100 MG TAB: 100 | 90 days supply | Qty: 90 | Fill #0

## 2019-02-03 ENCOUNTER — Other Ambulatory Visit: Payer: Self-pay

## 2019-02-04 ENCOUNTER — Encounter: Payer: Self-pay | Admitting: Family

## 2019-02-04 ENCOUNTER — Ambulatory Visit (INDEPENDENT_AMBULATORY_CARE_PROVIDER_SITE_OTHER): Payer: No Typology Code available for payment source | Admitting: Family

## 2019-02-04 VITALS — BP 110/65 | HR 71 | Temp 97.6°F | Ht 67.0 in | Wt 170.0 lb

## 2019-02-04 DIAGNOSIS — F411 Generalized anxiety disorder: Secondary | ICD-10-CM

## 2019-02-04 DIAGNOSIS — F1320 Sedative, hypnotic or anxiolytic dependence, uncomplicated: Secondary | ICD-10-CM | POA: Insufficient documentation

## 2019-02-04 DIAGNOSIS — Z0001 Encounter for general adult medical examination with abnormal findings: Secondary | ICD-10-CM

## 2019-02-04 DIAGNOSIS — E039 Hypothyroidism, unspecified: Secondary | ICD-10-CM | POA: Diagnosis not present

## 2019-02-04 DIAGNOSIS — K219 Gastro-esophageal reflux disease without esophagitis: Secondary | ICD-10-CM | POA: Insufficient documentation

## 2019-02-04 DIAGNOSIS — Z0000 Encounter for general adult medical examination without abnormal findings: Secondary | ICD-10-CM

## 2019-02-04 DIAGNOSIS — E785 Hyperlipidemia, unspecified: Secondary | ICD-10-CM | POA: Diagnosis not present

## 2019-02-04 DIAGNOSIS — Z8659 Personal history of other mental and behavioral disorders: Secondary | ICD-10-CM

## 2019-02-04 DIAGNOSIS — G4700 Insomnia, unspecified: Secondary | ICD-10-CM | POA: Diagnosis not present

## 2019-02-04 DIAGNOSIS — Z79899 Other long term (current) drug therapy: Secondary | ICD-10-CM | POA: Insufficient documentation

## 2019-02-04 MED ORDER — ALPRAZOLAM 0.5 MG PO TABS: ORAL_TABLET | ORAL | 5 refills | Status: DC

## 2019-02-04 MED ORDER — PANTOPRAZOLE SODIUM 20 MG PO TBEC: 20.0000 mg | DELAYED_RELEASE_TABLET | Freq: Every day | ORAL | 4 refills | Status: DC

## 2019-02-04 MED ORDER — TRAZODONE HCL 50 MG PO TABS: 25.0000 mg | ORAL_TABLET | Freq: Every evening | ORAL | 3 refills | Status: DC | PRN

## 2019-02-04 MED ORDER — SERTRALINE HCL 100 MG PO TABS: 100.0000 mg | ORAL_TABLET | Freq: Every day | ORAL | 3 refills | Status: DC

## 2019-02-04 MED FILL — PANTOPRAZOLE SOD DR 20 MG T: 20 | 90 days supply | Qty: 90 | Fill #0

## 2019-02-04 MED FILL — ALPRAZolam 0.5 MG TABS: 0.5 | 30 days supply | Qty: 60 | Fill #0

## 2019-02-04 MED FILL — traZODone HCL 50 MG TABS: 50 | 90 days supply | Qty: 90 | Fill #0

## 2019-02-04 NOTE — Progress Notes (Signed)
Subjective:    Patient ID: Katherine Hays, female    DOB: [birth year], 48 y.o.   MRN: [id number]  Chief Complaint  Patient presents with  . Annual Exam    no pap, need refills and lab work    PT presents to the office today for CPE without pap.  Thyroid Problem Presents for follow-up visit. Symptoms include anxiety, constipation, dry skin and fatigue. Patient reports no depressed mood, hoarse voice or menstrual problem. The symptoms have been stable. Her past medical history is significant for hyperlipidemia.  Insomnia Primary symptoms: difficulty falling asleep, malaise/fatigue.  The current episode started more than one year. The onset quality is gradual. The problem occurs intermittently. The problem has been waxing and waning since onset. The treatment provided moderate relief.  Anxiety Presents for follow-up visit. Symptoms include excessive worry, hyperventilation, insomnia, irritability, nervous/anxious behavior and restlessness. Patient reports no depressed mood. Symptoms occur constantly. The severity of symptoms is moderate. The quality of sleep is good.    Hyperlipidemia This is a chronic problem. The current episode started more than 1 year ago. The problem is uncontrolled. Recent lipid tests were reviewed and are normal. Current antihyperlipidemic treatment includes diet change. The current treatment provides no improvement of lipids. Risk factors for coronary artery disease include dyslipidemia.  Gastroesophageal Reflux She complains of heartburn. She reports no belching, no coughing or no hoarse voice. This is a chronic problem. The current episode started more than 1 year ago. The problem occurs occasionally. The symptoms are aggravated by certain foods. Associated symptoms include fatigue. She has tried a PPI for the symptoms. The treatment provided moderate relief.      Review of Systems  Constitutional: Positive for fatigue, irritability and malaise/fatigue.   HENT: Negative for hoarse voice.   Respiratory: Negative for cough.   Gastrointestinal: Positive for constipation and heartburn.  Genitourinary: Negative for menstrual problem.  Psychiatric/Behavioral: The patient is nervous/anxious and has insomnia.   All other systems reviewed and are negative.  Family History  Problem Relation Age of Onset  . Liver disease Father        cirrhosis  . Pancreatic cancer Maternal Uncle   . Diabetes Maternal Grandfather   . Stroke Maternal Grandfather   . Breast cancer Paternal Grandmother   . Cancer Paternal Grandmother        breast  . Heart disease Paternal Grandfather   . Stroke Paternal Grandfather   . Hyperthyroidism Mother   . Hypertension Brother   . Crohn's disease Other        Nephew   Social History   Socioeconomic History  . Marital status: Married    Spouse name: Not on file  . Number of children: 3  . Years of education: Not on file  . Highest education level: Not on file  Occupational History  . Occupation: Licensed conveyancer: [employer]  . Financial resource strain: Not on file  . Food insecurity    Worry: Not on file    Inability: Not on file  . Transportation needs    Medical: Not on file    Non-medical: Not on file  Tobacco Use  . Smoking status: Never Smoker  . Smokeless tobacco: Never Used  Substance and Sexual Activity  . Alcohol use: No  . Drug use: No  . Sexual activity: Yes  Lifestyle  . Physical activity    Days per week: Not on file    Minutes per session:  Not on file  . Stress: Not on file  Relationships  . Social Herbalist on phone: Not on file    Gets together: Not on file    Attends religious service: Not on file    Active member of club or organization: Not on file    Attends meetings of clubs or organizations: Not on file    Relationship status: Not on file  Other Topics Concern  . Not on file  Social History Narrative  . Not on file         Objective:   Physical Exam Vitals signs reviewed.  Constitutional:      General: She is not in acute distress.    Appearance: She is well-developed.  HENT:     Head: Normocephalic and atraumatic.     Right Ear: Tympanic membrane normal.     Left Ear: Tympanic membrane normal.  Eyes:     Pupils: Pupils are equal, round, and reactive to light.  Neck:     Musculoskeletal: Normal range of motion and neck supple.     Thyroid: No thyromegaly.  Cardiovascular:     Rate and Rhythm: Normal rate and regular rhythm.     Heart sounds: Normal heart sounds. No murmur.  Pulmonary:     Effort: Pulmonary effort is normal. No respiratory distress.     Breath sounds: Normal breath sounds. No wheezing.  Abdominal:     General: Bowel sounds are normal. There is no distension.     Palpations: Abdomen is soft.     Tenderness: There is no abdominal tenderness.  Musculoskeletal: Normal range of motion.        General: No tenderness.  Skin:    General: Skin is warm and dry.  Neurological:     Mental Status: She is alert and oriented to person, place, and time.     Cranial Nerves: No cranial nerve deficit.     Deep Tendon Reflexes: Reflexes are normal and symmetric.  Psychiatric:        Behavior: Behavior normal.        Thought Content: Thought content normal.        Judgment: Judgment normal.       BP 110/65   Pulse 71   Temp 97.6 F (36.4 C) (Oral)   Ht 5' 7" (1.702 m)   Wt 170 lb (77.1 kg)   BMI 26.63 kg/m      Assessment & Plan:  LYNNITA SOMMA comes in today with chief complaint of Annual Exam (no pap, need refills and lab work)   Diagnosis and orders addressed:  1. Annual physical exam - CMP14+EGFR - CBC with Differential/Platelet - Lipid panel - TSH  2. Hypothyroidism, unspecified type - CMP14+EGFR - CBC with Differential/Platelet - TSH  3. Insomnia, unspecified type -Trazodone started today - CMP14+EGFR - CBC with Differential/Platelet - sertraline (ZOLOFT)  100 MG tablet; Take 1 tablet (100 mg total) by mouth daily.  Dispense: 90 tablet; Refill: 3 - traZODone (DESYREL) 50 MG tablet; Take 0.5-1 tablets (25-50 mg total) by mouth at bedtime as needed for sleep.  Dispense: 90 tablet; Refill: 3  4. Hyperlipidemia, unspecified hyperlipidemia type - CMP14+EGFR - CBC with Differential/Platelet - Lipid panel  5. GAD (generalized anxiety disorder) - CMP14+EGFR - CBC with Differential/Platelet - ALPRAZolam (XANAX) 0.5 MG tablet; TAKE  (1)  TABLET TWICE A DAY.  Dispense: 60 tablet; Refill: 5 - sertraline (ZOLOFT) 100 MG tablet; Take 1 tablet (100 mg total) by  mouth daily.  Dispense: 90 tablet; Refill: 3  6. Controlled substance agreement signed - CMP14+EGFR - CBC with Differential/Platelet - ALPRAZolam (XANAX) 0.5 MG tablet; TAKE  (1)  TABLET TWICE A DAY.  Dispense: 60 tablet; Refill: 5  7. Benzodiazepine dependence (HCC) - CMP14+EGFR - CBC with Differential/Platelet - ALPRAZolam (XANAX) 0.5 MG tablet; TAKE  (1)  TABLET TWICE A DAY.  Dispense: 60 tablet; Refill: 5 -Drug screen pending  9. History of OCD (obsessive compulsive disorder) - sertraline (ZOLOFT) 100 MG tablet; Take 1 tablet (100 mg total) by mouth daily.  Dispense: 90 tablet; Refill: 3  10. Gastroesophageal reflux disease, esophagitis presence not specified - pantoprazole (PROTONIX) 20 MG tablet; Take 1 tablet (20 mg total) by mouth daily.  Dispense: 90 tablet; Refill: 4   Labs pending Pt reviewed in [place] controlled database- No red flags noted Health Maintenance reviewed Diet and exercise encouraged  Follow up plan: 6 months    Evelina Dun, FNP

## 2019-02-04 NOTE — Patient Instructions (Signed)

## 2019-02-05 LAB — CBC WITH DIFFERENTIAL/PLATELET
Basophils Absolute: 0 10*3/uL (ref 0.0–0.2)
Basos: 1 %
EOS (ABSOLUTE): 0.2 10*3/uL (ref 0.0–0.4)
Eos: 3 %
Hematocrit: 40.3 % (ref 34.0–46.6)
Hemoglobin: 13.4 g/dL (ref 11.1–15.9)
Immature Grans (Abs): 0 10*3/uL (ref 0.0–0.1)
Immature Granulocytes: 0 %
Lymphocytes Absolute: 1.8 10*3/uL (ref 0.7–3.1)
Lymphs: 26 %
MCH: 30.4 pg (ref 26.6–33.0)
MCHC: 33.3 g/dL (ref 31.5–35.7)
MCV: 91 fL (ref 79–97)
Monocytes Absolute: 0.6 10*3/uL (ref 0.1–0.9)
Monocytes: 9 %
Neutrophils Absolute: 4.3 10*3/uL (ref 1.4–7.0)
Neutrophils: 61 %
Platelets: 299 10*3/uL (ref 150–450)
RBC: 4.41 x10E6/uL (ref 3.77–5.28)
RDW: 12.3 % (ref 11.7–15.4)
WBC: 7 10*3/uL (ref 3.4–10.8)

## 2019-02-05 LAB — CMP14+EGFR
ALT: 14 IU/L (ref 0–32)
AST: 18 IU/L (ref 0–40)
Albumin/Globulin Ratio: 2 (ref 1.2–2.2)
Albumin: 4.3 g/dL (ref 3.8–4.8)
Alkaline Phosphatase: 73 IU/L (ref 39–117)
BUN/Creatinine Ratio: 17 (ref 9–23)
BUN: 11 mg/dL (ref 6–24)
Bilirubin Total: 0.2 mg/dL (ref 0.0–1.2)
CO2: 23 mmol/L (ref 20–29)
Calcium: 9.2 mg/dL (ref 8.7–10.2)
Chloride: 102 mmol/L (ref 96–106)
Creatinine, Ser: 0.66 mg/dL (ref 0.57–1.00)
GFR calc Af Amer: 120 mL/min/{1.73_m2} (ref >59)
GFR calc non Af Amer: 104 mL/min/{1.73_m2} (ref >59)
Globulin, Total: 2.1 g/dL (ref 1.5–4.5)
Glucose: 88 mg/dL (ref 65–99)
Potassium: 4.6 mmol/L (ref 3.5–5.2)
Sodium: 139 mmol/L (ref 134–144)
Total Protein: 6.4 g/dL (ref 6.0–8.5)

## 2019-02-05 LAB — LIPID PANEL
Chol/HDL Ratio: 4.3 ratio (ref 0.0–4.4)
Cholesterol, Total: 180 mg/dL (ref 100–199)
HDL: 42 mg/dL (ref >39)
LDL Calculated: 113 mg/dL — ABNORMAL HIGH (ref 0–99)
Triglycerides: 123 mg/dL (ref 0–149)
VLDL Cholesterol Cal: 25 mg/dL (ref 5–40)

## 2019-02-05 LAB — TSH: TSH: 3.3 u[IU]/mL (ref 0.450–4.500)

## 2019-02-07 LAB — TOXASSURE SELECT 13 (MW), URINE

## 2019-02-09 ENCOUNTER — Encounter: Payer: No Typology Code available for payment source | Admitting: Family

## 2019-02-11 ENCOUNTER — Other Ambulatory Visit: Payer: Self-pay | Admitting: Family

## 2019-02-15 ENCOUNTER — Encounter: Payer: Self-pay | Admitting: Family Medicine

## 2019-02-15 ENCOUNTER — Ambulatory Visit (INDEPENDENT_AMBULATORY_CARE_PROVIDER_SITE_OTHER): Payer: No Typology Code available for payment source | Admitting: Family Medicine

## 2019-02-15 ENCOUNTER — Other Ambulatory Visit: Payer: Self-pay

## 2019-02-15 VITALS — BP 104/65 | HR 80 | Temp 97.6°F | Ht 67.0 in | Wt 170.0 lb

## 2019-02-15 DIAGNOSIS — J340 Abscess, furuncle and carbuncle of nose: Secondary | ICD-10-CM | POA: Diagnosis not present

## 2019-02-15 MED ORDER — SULFAMETHOXAZOLE-TRIMETHOPRIM 800-160 MG PO TABS: 1.0000 | ORAL_TABLET | Freq: Two times a day (BID) | ORAL | 0 refills | Status: AC

## 2019-02-15 NOTE — Progress Notes (Signed)
Subjective:  Patient ID: Katherine Hays, female    DOB: Jul 16, 1971, 48 y.o.   MRN: [id number]  Chief Complaint:  Abscess   HPI: Katherine Hays is a 48 y.o. female presenting on 02/15/2019 for Abscess  Pt presents today with complaints a sore to the tip of her nose. Pt states this started several weeks ago on the inside of her nose. States she developed the sore on the external portion of her nose a few days ago. Pt states she has been putting neosporin on the area without relief of pain. No fever, chills, fatigue, or weakness. No drainage.   Relevant past medical, surgical, family, and social history reviewed and updated as indicated.  Allergies and medications reviewed and updated.   Past Medical History:  Diagnosis Date  . Anxiety   . Asthma   . OCD (obsessive compulsive disorder)     Past Surgical History:  Procedure Laterality Date  . TONSILLECTOMY    . TUBAL LIGATION    . WISDOM TOOTH EXTRACTION      Social History   Socioeconomic History  . Marital status: Married    Spouse name: Not on file  . Number of children: 3  . Years of education: Not on file  . Highest education level: Not on file  Occupational History  . Occupation: Licensed conveyancer: [employer]  . Financial resource strain: Not on file  . Food insecurity    Worry: Not on file    Inability: Not on file  . Transportation needs    Medical: Not on file    Non-medical: Not on file  Tobacco Use  . Smoking status: Never Smoker  . Smokeless tobacco: Never Used  Substance and Sexual Activity  . Alcohol use: No  . Drug use: No  . Sexual activity: Yes  Lifestyle  . Physical activity    Days per week: Not on file    Minutes per session: Not on file  . Stress: Not on file  Relationships  . Social Herbalist on phone: Not on file    Gets together: Not on file    Attends religious service: Not on file    Active member of club or organization: Not on file     Attends meetings of clubs or organizations: Not on file    Relationship status: Not on file  . Intimate partner violence    Fear of current or ex partner: Not on file    Emotionally abused: Not on file    Physically abused: Not on file    Forced sexual activity: Not on file  Other Topics Concern  . Not on file  Social History Narrative  . Not on file    Outpatient Encounter Medications as of 02/15/2019  Medication Sig  . ALPRAZolam (XANAX) 0.5 MG tablet TAKE  (1)  TABLET TWICE A DAY.  Marland Kitchen levonorgestrel (MIRENA) 20 MCG/24HR IUD 1 each by Intrauterine route once.  Marland Kitchen levothyroxine (SYNTHROID) 50 MCG tablet Take 1 tablet (50 mcg total) by mouth daily before breakfast.  . pantoprazole (PROTONIX) 20 MG tablet Take 1 tablet (20 mg total) by mouth daily.  . sertraline (ZOLOFT) 100 MG tablet Take 1 tablet (100 mg total) by mouth daily.  Marland Kitchen sulfamethoxazole-trimethoprim (BACTRIM DS) 800-160 MG tablet Take 1 tablet by mouth 2 (two) times daily for 7 days.  . traZODone (DESYREL) 50 MG tablet Take 0.5-1 tablets (25-50 mg total) by  mouth at bedtime as needed for sleep.   No facility-administered encounter medications on file as of 02/15/2019.     Allergies  Allergen Reactions  . Codeine     Review of Systems  Constitutional: Negative for chills, fatigue and fever.  Respiratory: Negative for cough and shortness of breath.   Cardiovascular: Negative for chest pain and palpitations.  Skin: Positive for color change and wound.  Neurological: Negative for dizziness, weakness, light-headedness and headaches.  Psychiatric/Behavioral: Negative for confusion.  All other systems reviewed and are negative.       Objective:  BP 104/65   Pulse 80   Temp 97.6 F (36.4 C) (Oral)   Ht '5\' 7"'$  (1.702 m)   Wt 170 lb (77.1 kg)   BMI 26.63 kg/m    Wt Readings from Last 3 Encounters:  02/15/19 170 lb (77.1 kg)  02/04/19 170 lb (77.1 kg)  12/14/18 156 lb (70.8 kg)    Physical Exam Vitals signs  and nursing note reviewed.  Constitutional:      General: She is not in acute distress.    Appearance: Normal appearance. She is well-developed and well-groomed. She is not ill-appearing or toxic-appearing.  HENT:     Head: Normocephalic and atraumatic.     Mouth/Throat:     Mouth: Mucous membranes are moist.  Eyes:     Conjunctiva/sclera: Conjunctivae normal.     Pupils: Pupils are equal, round, and reactive to light.  Neck:     Musculoskeletal: Neck supple.  Cardiovascular:     Rate and Rhythm: Normal rate and regular rhythm.     Heart sounds: Normal heart sounds. No murmur. No friction rub. No gallop.   Pulmonary:     Effort: Pulmonary effort is normal.     Breath sounds: Normal breath sounds.  Skin:    General: Skin is warm and dry.     Capillary Refill: Capillary refill takes less than 2 seconds.     Findings: Abscess and lesion present.     Comments: Abscess to nasal tip. Swelling, erythema, slight purulent drainage.   Neurological:     General: No focal deficit present.     Mental Status: She is alert and oriented to person, place, and time.  Psychiatric:        Mood and Affect: Mood normal.        Behavior: Behavior normal. Behavior is cooperative.        Thought Content: Thought content normal.        Judgment: Judgment normal.     Results for orders placed or performed in visit on 02/04/19  CMP14+EGFR  Result Value Ref Range   Glucose 88 65 - 99 mg/dL   BUN 11 6 - 24 mg/dL   Creatinine, Ser 0.66 0.57 - 1.00 mg/dL   GFR calc non Af Amer 104 >59 mL/min/1.73   GFR calc Af Amer 120 >59 mL/min/1.73   BUN/Creatinine Ratio 17 9 - 23   Sodium 139 134 - 144 mmol/L   Potassium 4.6 3.5 - 5.2 mmol/L   Chloride 102 96 - 106 mmol/L   CO2 23 20 - 29 mmol/L   Calcium 9.2 8.7 - 10.2 mg/dL   Total Protein 6.4 6.0 - 8.5 g/dL   Albumin 4.3 3.8 - 4.8 g/dL   Globulin, Total 2.1 1.5 - 4.5 g/dL   Albumin/Globulin Ratio 2.0 1.2 - 2.2   Bilirubin Total 0.2 0.0 - 1.2 mg/dL    Alkaline Phosphatase 73 39 - 117 IU/L  AST 18 0 - 40 IU/L   ALT 14 0 - 32 IU/L  CBC with Differential/Platelet  Result Value Ref Range   WBC 7.0 3.4 - 10.8 x10E3/uL   RBC 4.41 3.77 - 5.28 x10E6/uL   Hemoglobin 13.4 11.1 - 15.9 g/dL   Hematocrit 40.3 34.0 - 46.6 %   MCV 91 79 - 97 fL   MCH 30.4 26.6 - 33.0 pg   MCHC 33.3 31.5 - 35.7 g/dL   RDW 12.3 11.7 - 15.4 %   Platelets 299 150 - 450 x10E3/uL   Neutrophils 61 Not Estab. %   Lymphs 26 Not Estab. %   Monocytes 9 Not Estab. %   Eos 3 Not Estab. %   Basos 1 Not Estab. %   Neutrophils Absolute 4.3 1.4 - 7.0 x10E3/uL   Lymphocytes Absolute 1.8 0.7 - 3.1 x10E3/uL   Monocytes Absolute 0.6 0.1 - 0.9 x10E3/uL   EOS (ABSOLUTE) 0.2 0.0 - 0.4 x10E3/uL   Basophils Absolute 0.0 0.0 - 0.2 x10E3/uL   Immature Granulocytes 0 Not Estab. %   Immature Grans (Abs) 0.0 0.0 - 0.1 x10E3/uL  Lipid panel  Result Value Ref Range   Cholesterol, Total 180 100 - 199 mg/dL   Triglycerides 123 0 - 149 mg/dL   HDL 42 >39 mg/dL   VLDL Cholesterol Cal 25 5 - 40 mg/dL   LDL Calculated 113 (H) 0 - 99 mg/dL   Chol/HDL Ratio 4.3 0.0 - 4.4 ratio  TSH  Result Value Ref Range   TSH 3.300 0.450 - 4.500 uIU/mL  ToxASSURE Select 13 (MW), Urine  Result Value Ref Range   Summary Note        Pertinent labs & imaging results that were available during my care of the patient were reviewed by me and considered in my medical decision making.  Assessment & Plan:  Katherine Hays was seen today for abscess.  Diagnoses and all orders for this visit:  Nasal abscess Wound care discussed. Can continue topical neosporin. Medications as prescribed. Report any new or worsening symptoms.  -     sulfamethoxazole-trimethoprim (BACTRIM DS) 800-160 MG tablet; Take 1 tablet by mouth 2 (two) times daily for 7 days.     Continue all other maintenance medications.  Follow up plan: Return if symptoms worsen or fail to improve.  Educational handout given for abscess   The above  assessment and management plan was discussed with the patient. The patient verbalized understanding of and has agreed to the management plan. Patient is aware to call the clinic if symptoms persist or worsen. Patient is aware when to return to the clinic for a follow-up visit. Patient educated on when it is appropriate to go to the emergency department.   Monia Pouch, FNP-C [facility] [phone]

## 2019-02-15 NOTE — Patient Instructions (Signed)
It was a pleasure seeing you today, Katherine Hays.  Information regarding what we discussed is included in this packet.  Please follow up if not improving.   In a few days you may receive a survey in the mail or online from [employer] regarding your visit with [place] today. Please take a moment to fill this out. Your feedback is very important to our office. It can help [place] better understand your needs as well as improve your experience and satisfaction. Thank you for taking your time to complete it. We care about you.  Because of recent events of COVID-19 ("Coronavirus"), please follow CDC recommendations:   1. Wash your hand frequently 2. Avoid touching your face 3. Stay away from people who are sick 4. If you have symptoms such as fever, cough, shortness of breath then call your healthcare provider for further guidance 5. If you are sick, STAY AT HOME, unless otherwise directed by your healthcare provider. 6. Follow directions from state and national officials regarding staying safe    Please feel free to call our office if any questions or concerns arise.  Warm Regards, Monia Pouch, FNP-C [facility] [address] [place], [place] [address] [phone]

## 2019-02-18 ENCOUNTER — Ambulatory Visit: Payer: No Typology Code available for payment source | Admitting: Family Medicine

## 2019-02-18 ENCOUNTER — Other Ambulatory Visit: Payer: Self-pay

## 2019-02-18 ENCOUNTER — Encounter: Payer: Self-pay | Admitting: Family Medicine

## 2019-02-18 ENCOUNTER — Ambulatory Visit (INDEPENDENT_AMBULATORY_CARE_PROVIDER_SITE_OTHER): Payer: No Typology Code available for payment source | Admitting: Family Medicine

## 2019-02-18 VITALS — BP 114/65 | HR 71 | Temp 97.5°F | Ht 67.0 in | Wt 170.0 lb

## 2019-02-18 DIAGNOSIS — R202 Paresthesia of skin: Secondary | ICD-10-CM | POA: Diagnosis not present

## 2019-02-18 DIAGNOSIS — R252 Cramp and spasm: Secondary | ICD-10-CM

## 2019-02-18 LAB — CMP14+EGFR
ALT: 15 IU/L (ref 0–32)
AST: 16 IU/L (ref 0–40)
Albumin/Globulin Ratio: 2.3 — ABNORMAL HIGH (ref 1.2–2.2)
Albumin: 4.5 g/dL (ref 3.8–4.8)
Alkaline Phosphatase: 71 IU/L (ref 39–117)
BUN/Creatinine Ratio: 14 (ref 9–23)
BUN: 9 mg/dL (ref 6–24)
Bilirubin Total: 0.2 mg/dL (ref 0.0–1.2)
CO2: 22 mmol/L (ref 20–29)
Calcium: 9.3 mg/dL (ref 8.7–10.2)
Chloride: 103 mmol/L (ref 96–106)
Creatinine, Ser: 0.66 mg/dL (ref 0.57–1.00)
GFR calc Af Amer: 120 mL/min/{1.73_m2} (ref >59)
GFR calc non Af Amer: 104 mL/min/{1.73_m2} (ref >59)
Globulin, Total: 2 g/dL (ref 1.5–4.5)
Glucose: 82 mg/dL (ref 65–99)
Potassium: 5 mmol/L (ref 3.5–5.2)
Sodium: 140 mmol/L (ref 134–144)
Total Protein: 6.5 g/dL (ref 6.0–8.5)

## 2019-02-18 MED ORDER — PREDNISONE 20 MG PO TABS: ORAL_TABLET | ORAL | 0 refills | Status: DC

## 2019-02-18 NOTE — Progress Notes (Signed)
Subjective:  Patient ID: Katherine Hays, female    DOB: 1970/12/07, 48 y.o.   MRN: [id number]  Chief Complaint:  hand drawing (right )   HPI: Katherine Hays is a 48 y.o. female presenting on 02/18/2019 for hand drawing (right )  Pt reports right third, fourth, and fifth fingers tingling and left hand numbness. States this happened this morning when she arrived at work. States the symptoms started when she picked up a pencil to write. She states it felt as if her hand was cramping up and drawing in. She denies injury, loss of function, wrist pain, headache, neck pain, chest pain, shortness of breath, slurred speech, facial droop, or visual changes. She states the numbness in her left hand has subsided but reports her right fingers still feel tingly.   Relevant past medical, surgical, family, and social history reviewed and updated as indicated.  Allergies and medications reviewed and updated.   Past Medical History:  Diagnosis Date  . Anxiety   . Asthma   . OCD (obsessive compulsive disorder)     Past Surgical History:  Procedure Laterality Date  . TONSILLECTOMY    . TUBAL LIGATION    . WISDOM TOOTH EXTRACTION      Social History   Socioeconomic History  . Marital status: Married    Spouse name: Not on file  . Number of children: 3  . Years of education: Not on file  . Highest education level: Not on file  Occupational History  . Occupation: Licensed conveyancer: [employer]  . Financial resource strain: Not on file  . Food insecurity    Worry: Not on file    Inability: Not on file  . Transportation needs    Medical: Not on file    Non-medical: Not on file  Tobacco Use  . Smoking status: Never Smoker  . Smokeless tobacco: Never Used  Substance and Sexual Activity  . Alcohol use: No  . Drug use: No  . Sexual activity: Yes  Lifestyle  . Physical activity    Days per week: Not on file    Minutes per session: Not on file  . Stress:  Not on file  Relationships  . Social Herbalist on phone: Not on file    Gets together: Not on file    Attends religious service: Not on file    Active member of club or organization: Not on file    Attends meetings of clubs or organizations: Not on file    Relationship status: Not on file  . Intimate partner violence    Fear of current or ex partner: Not on file    Emotionally abused: Not on file    Physically abused: Not on file    Forced sexual activity: Not on file  Other Topics Concern  . Not on file  Social History Narrative  . Not on file    Outpatient Encounter Medications as of 02/18/2019  Medication Sig  . ALPRAZolam (XANAX) 0.5 MG tablet TAKE  (1)  TABLET TWICE A DAY.  Marland Kitchen levonorgestrel (MIRENA) 20 MCG/24HR IUD 1 each by Intrauterine route once.  Marland Kitchen levothyroxine (SYNTHROID) 50 MCG tablet Take 1 tablet (50 mcg total) by mouth daily before breakfast.  . pantoprazole (PROTONIX) 20 MG tablet Take 1 tablet (20 mg total) by mouth daily.  . sertraline (ZOLOFT) 100 MG tablet Take 1 tablet (100 mg total) by mouth daily.  Marland Kitchen  sulfamethoxazole-trimethoprim (BACTRIM DS) 800-160 MG tablet Take 1 tablet by mouth 2 (two) times daily for 7 days.  . traZODone (DESYREL) 50 MG tablet Take 0.5-1 tablets (25-50 mg total) by mouth at bedtime as needed for sleep.  . predniSONE (DELTASONE) 20 MG tablet 2 po at sametime daily for 5 days   No facility-administered encounter medications on file as of 02/18/2019.     Allergies  Allergen Reactions  . Codeine     Review of Systems  Constitutional: Negative for activity change, appetite change, chills, diaphoresis, fatigue, fever and unexpected weight change.  Respiratory: Negative for cough, chest tightness, shortness of breath and wheezing.   Cardiovascular: Negative for chest pain, palpitations and leg swelling.  Musculoskeletal: Negative for arthralgias, gait problem, joint swelling, myalgias, neck pain and neck stiffness.   Neurological: Positive for numbness. Negative for dizziness, tremors, seizures, syncope, facial asymmetry, speech difficulty, weakness, light-headedness and headaches.  Psychiatric/Behavioral: Negative for confusion.  All other systems reviewed and are negative.       Objective:  BP 114/65   Pulse 71   Temp (!) 97.5 F (36.4 C) (Oral)   Ht '5\' 7"'$  (1.702 m)   Wt 170 lb (77.1 kg)   BMI 26.63 kg/m    Wt Readings from Last 3 Encounters:  02/18/19 170 lb (77.1 kg)  02/15/19 170 lb (77.1 kg)  02/04/19 170 lb (77.1 kg)    Physical Exam Vitals signs and nursing note reviewed.  Constitutional:      Appearance: Normal appearance.  HENT:     Head: Normocephalic and atraumatic.  Eyes:     Extraocular Movements: Extraocular movements intact.     Conjunctiva/sclera: Conjunctivae normal.     Pupils: Pupils are equal, round, and reactive to light.  Neck:     Musculoskeletal: Normal range of motion and neck supple.  Cardiovascular:     Rate and Rhythm: Normal rate and regular rhythm.     Pulses: Normal pulses.     Heart sounds: Normal heart sounds. No murmur. No friction rub. No gallop.   Pulmonary:     Effort: Pulmonary effort is normal. No respiratory distress.     Breath sounds: Normal breath sounds.  Musculoskeletal:     Right wrist: Normal.     Left wrist: Normal.     Right hand: Normal.     Left hand: Normal.     Comments: Negative Phalen test and Tinel sign bilaterally.   Skin:    General: Skin is warm and dry.     Capillary Refill: Capillary refill takes less than 2 seconds.  Neurological:     General: No focal deficit present.     Mental Status: She is alert and oriented to person, place, and time.     Cranial Nerves: No cranial nerve deficit.     Sensory: No sensory deficit.     Motor: No weakness.     Coordination: Coordination normal.     Gait: Gait normal.     Deep Tendon Reflexes: Reflexes normal.  Psychiatric:        Mood and Affect: Mood normal.         Behavior: Behavior normal.        Thought Content: Thought content normal.        Judgment: Judgment normal.     Results for orders placed or performed in visit on 02/04/19  CMP14+EGFR  Result Value Ref Range   Glucose 88 65 - 99 mg/dL   BUN 11 6 -  24 mg/dL   Creatinine, Ser 0.66 0.57 - 1.00 mg/dL   GFR calc non Af Amer 104 >59 mL/min/1.73   GFR calc Af Amer 120 >59 mL/min/1.73   BUN/Creatinine Ratio 17 9 - 23   Sodium 139 134 - 144 mmol/L   Potassium 4.6 3.5 - 5.2 mmol/L   Chloride 102 96 - 106 mmol/L   CO2 23 20 - 29 mmol/L   Calcium 9.2 8.7 - 10.2 mg/dL   Total Protein 6.4 6.0 - 8.5 g/dL   Albumin 4.3 3.8 - 4.8 g/dL   Globulin, Total 2.1 1.5 - 4.5 g/dL   Albumin/Globulin Ratio 2.0 1.2 - 2.2   Bilirubin Total 0.2 0.0 - 1.2 mg/dL   Alkaline Phosphatase 73 39 - 117 IU/L   AST 18 0 - 40 IU/L   ALT 14 0 - 32 IU/L  CBC with Differential/Platelet  Result Value Ref Range   WBC 7.0 3.4 - 10.8 x10E3/uL   RBC 4.41 3.77 - 5.28 x10E6/uL   Hemoglobin 13.4 11.1 - 15.9 g/dL   Hematocrit 40.3 34.0 - 46.6 %   MCV 91 79 - 97 fL   MCH 30.4 26.6 - 33.0 pg   MCHC 33.3 31.5 - 35.7 g/dL   RDW 12.3 11.7 - 15.4 %   Platelets 299 150 - 450 x10E3/uL   Neutrophils 61 Not Estab. %   Lymphs 26 Not Estab. %   Monocytes 9 Not Estab. %   Eos 3 Not Estab. %   Basos 1 Not Estab. %   Neutrophils Absolute 4.3 1.4 - 7.0 x10E3/uL   Lymphocytes Absolute 1.8 0.7 - 3.1 x10E3/uL   Monocytes Absolute 0.6 0.1 - 0.9 x10E3/uL   EOS (ABSOLUTE) 0.2 0.0 - 0.4 x10E3/uL   Basophils Absolute 0.0 0.0 - 0.2 x10E3/uL   Immature Granulocytes 0 Not Estab. %   Immature Grans (Abs) 0.0 0.0 - 0.1 x10E3/uL  Lipid panel  Result Value Ref Range   Cholesterol, Total 180 100 - 199 mg/dL   Triglycerides 123 0 - 149 mg/dL   HDL 42 >39 mg/dL   VLDL Cholesterol Cal 25 5 - 40 mg/dL   LDL Calculated 113 (H) 0 - 99 mg/dL   Chol/HDL Ratio 4.3 0.0 - 4.4 ratio  TSH  Result Value Ref Range   TSH 3.300 0.450 - 4.500 uIU/mL   ToxASSURE Select 13 (MW), Urine  Result Value Ref Range   Summary Note        Pertinent labs & imaging results that were available during my care of the patient were reviewed by me and considered in my medical decision making.  Assessment & Plan:  Katherine Hays was seen today for hand drawing.  Diagnoses and all orders for this visit:  Paresthesia of hand, bilateral Muscle cramp Negative Phalen and tinel. Pt does work on computer so could still be early signs of carpal tunnel syndrome. Symptomatic care discussed. NSAIDs if beneficial. Could be related to cervical radiculopathy. Not concerning for acute neurovascular event. Will trial burst steroids to see if beneficial. Will check electrolytes and B12 to make sure this is not the cause. Pt aware to report any new or worsening symptoms. Medications as prescribed.   -     CMP14+EGFR -     predniSONE (DELTASONE) 20 MG tablet; 2 po at sametime daily for 5 days     Continue all other maintenance medications.  Follow up plan: Return in about 2 weeks (around 03/04/2019), or if symptoms worsen or fail to improve, for  hand paresthesia.  Educational handout given for cervical radiculopathy  The above assessment and management plan was discussed with the patient. The patient verbalized understanding of and has agreed to the management plan. Patient is aware to call the clinic if symptoms persist or worsen. Patient is aware when to return to the clinic for a follow-up visit. Patient educated on when it is appropriate to go to the emergency department.   Monia Pouch, FNP-C [facility] [phone]

## 2019-02-18 NOTE — Patient Instructions (Signed)

## 2019-02-18 NOTE — Progress Notes (Signed)
Hand

## 2019-02-23 LAB — SPECIMEN STATUS REPORT

## 2019-02-23 LAB — VITAMIN B12: Vitamin B-12: 476 pg/mL (ref 232–1245)

## 2019-03-16 ENCOUNTER — Other Ambulatory Visit: Payer: Self-pay | Admitting: Family

## 2019-03-16 DIAGNOSIS — E039 Hypothyroidism, unspecified: Secondary | ICD-10-CM

## 2019-03-16 MED FILL — LEVOTHYROXINE 50 MCG TABLET: 50 | 90 days supply | Qty: 90 | Fill #0

## 2019-04-08 ENCOUNTER — Ambulatory Visit (INDEPENDENT_AMBULATORY_CARE_PROVIDER_SITE_OTHER): Payer: No Typology Code available for payment source | Admitting: Family

## 2019-04-08 ENCOUNTER — Other Ambulatory Visit: Payer: Self-pay

## 2019-04-08 ENCOUNTER — Encounter: Payer: Self-pay | Admitting: Family

## 2019-04-08 VITALS — BP 109/66 | HR 85 | Temp 97.3°F | Ht 67.0 in | Wt 169.4 lb

## 2019-04-08 DIAGNOSIS — E039 Hypothyroidism, unspecified: Secondary | ICD-10-CM | POA: Diagnosis not present

## 2019-04-08 DIAGNOSIS — R221 Localized swelling, mass and lump, neck: Secondary | ICD-10-CM | POA: Diagnosis not present

## 2019-04-08 DIAGNOSIS — E049 Nontoxic goiter, unspecified: Secondary | ICD-10-CM | POA: Diagnosis not present

## 2019-04-08 NOTE — Patient Instructions (Signed)
Thyroid Nodule  A thyroid nodule is an isolated growth of thyroid cells that forms a lump in your thyroid gland. The thyroid gland is a butterfly-shaped gland. It is found in the lower front of your neck. This gland sends chemical messengers (hormones) through your blood to all parts of your body. These hormones are important in regulating your body temperature and helping your body to use energy. Thyroid nodules are common. Most are not cancerous (benign). You may have one nodule or several nodules. Different types of thyroid nodules include nodules that:  Grow and fill with fluid (thyroid cysts).  Produce too much thyroid hormone (hot nodules or hyperthyroid).  Produce no thyroid hormone (cold nodules or hypothyroid).  Form from cancer cells (thyroid cancers). What are the causes? In most cases, the cause of this condition is not known. What increases the risk? The following factors may make you more likely to develop this condition.  Age. Thyroid nodules become more common in people who are older than 50 years of age.  Gender. ? Benign thyroid nodules are more common in women. ? Cancerous (malignant) thyroid nodules are more common in men.  A family history that includes: ? Thyroid nodules. ? Pheochromocytoma. ? Thyroid carcinoma. ? Hyperparathyroidism.  Certain kinds of thyroid diseases, such as Hashimoto's thyroiditis.  Lack of iodine in your diet.  A history of head and neck radiation, such as from previous cancer treatment. What are the signs or symptoms? In many cases, there are no symptoms. If you have symptoms, they may include:  A lump in your lower neck.  Feeling a lump or tickle in your throat.  Pain in your neck, jaw, or ear.  Having trouble swallowing. Hot nodules may cause symptoms that include:  Weight loss.  Warm, flushed skin.  Feeling hot.  Feeling nervous.  A racing heartbeat. Cold nodules may cause symptoms that include:  Weight gain.   Dry skin.  Brittle hair. This may also occur with hair loss.  Feeling cold.  Fatigue. Thyroid cancer nodules may cause symptoms that include:  Hard nodules that feel stuck to the thyroid gland.  Hoarseness.  Lumps in the glands near your thyroid (lymph nodes). How is this diagnosed? A thyroid nodule may be felt by your health care provider during a physical exam. This condition may also be diagnosed based on your symptoms. You may also have tests, including:  An ultrasound. This may be done to confirm the diagnosis.  A biopsy. This involves taking a sample from the nodule and looking at it under a microscope.  Blood tests to make sure that your thyroid is working properly.  A thyroid scan. This test uses a radioactive tracer injected into a vein to create an image of the thyroid gland on a computer screen.  Imaging tests such as MRI or CT scan. These may be done if: ? Your nodule is large. ? Your nodule is blocking your airway. ? Cancer is suspected. How is this treated? Treatment depends on the cause and size of your nodule or nodules. If the nodule is benign, treatment may not be necessary. Your health care provider may monitor the nodule to see if it goes away without treatment. If the nodule continues to grow, is cancerous, or does not go away, treatment may be needed. Treatment may include:  Having a cystic nodule drained with a needle.  Ablation therapy. In this treatment, alcohol is injected into the area of the nodule to destroy the cells. Ablation with heat (  thermal ablation) may also be used.  Radioactive iodine. In this treatment, radioactive iodine is given as a pill or liquid that you drink. This substance causes the thyroid nodule to shrink.  Surgery to remove the nodule. Part or all of your thyroid gland may need to be removed as well.  Medicines. Follow these instructions at home:  Pay attention to any changes in your nodule.  Take over-the-counter and  prescription medicines only as told by your health care provider.  Keep all follow-up visits as told by your health care provider. This is important. Contact a health care provider if:  Your voice changes.  You have trouble swallowing.  You have pain in your neck, ear, or jaw that is getting worse.  Your nodule gets bigger.  Your nodule starts to make it harder for you to breathe.  Your muscles look like they are shrinking (muscle wasting). Get help right away if:  You have chest pain.  There is a loss of consciousness.  You have a sudden fever.  You feel confused.  You are seeing or hearing things that other people do not see or hear (having hallucinations).  You feel very weak.  You have mood swings.  You feel very restless.  You feel suddenly nauseous or throw up.  You suddenly have diarrhea. Summary  A thyroid nodule is an isolated growth of thyroid cells that forms a lump in your thyroid gland.  Thyroid nodules are common. Most are not cancerous (benign). You may have one nodule or several nodules.  Treatment depends on the cause and size of your nodule or nodules. If the nodule is benign, treatment may not be necessary.  Your health care provider may monitor the nodule to see if it goes away without treatment. If the nodule continues to grow, is cancerous, or does not go away, treatment may be needed. This information is not intended to replace advice given to you by your health care provider. Make sure you discuss any questions you have with your health care provider. Document Released: 06/28/2004 Document Revised: 03/20/2018 Document Reviewed: 03/23/2018 Elsevier Patient Education  2020 Elsevier Inc.  

## 2019-04-08 NOTE — Progress Notes (Signed)
   Subjective:    Patient ID: Katherine Hays, female    DOB: 02-12-1971, 48 y.o.   MRN: [id number]  Chief Complaint  Patient presents with  . check for a thyroid nodule    HPI Pt presents to the office today with complaints of swelling of her thyroid that she noticed last week. She states this feeling comes and goes. Denies any problem swallowing or SOB. She does have hypothyroidism and is taking levothyroxine.  She states her son has left for college so she has been crying everyday, but other than that has felt fine. She is worried about this and wants to get a scan.    Review of Systems  All other systems reviewed and are negative.      Objective:   Physical Exam Vitals signs reviewed.  Constitutional:      General: She is not in acute distress.    Appearance: She is well-developed.  HENT:     Head: Normocephalic and atraumatic.     Right Ear: Tympanic membrane normal.     Left Ear: Tympanic membrane normal.  Eyes:     Pupils: Pupils are equal, round, and reactive to light.  Neck:     Musculoskeletal: Normal range of motion and neck supple.     Thyroid: No thyromegaly.     Comments: Goiter present  Cardiovascular:     Rate and Rhythm: Normal rate and regular rhythm.     Heart sounds: Normal heart sounds. No murmur.  Pulmonary:     Effort: Pulmonary effort is normal. No respiratory distress.     Breath sounds: Normal breath sounds. No wheezing.  Abdominal:     General: Bowel sounds are normal. There is no distension.     Palpations: Abdomen is soft.     Tenderness: There is no abdominal tenderness.  Musculoskeletal: Normal range of motion.        General: No tenderness.  Skin:    General: Skin is warm and dry.  Neurological:     Mental Status: She is alert and oriented to person, place, and time.     Cranial Nerves: No cranial nerve deficit.     Deep Tendon Reflexes: Reflexes are normal and symmetric.  Psychiatric:        Behavior: Behavior normal.      Thought Content: Thought content normal.        Judgment: Judgment normal.      BP 109/66   Pulse 85   Temp (!) 97.3 F (36.3 C) (Oral)   Ht 5\' 7"  (1.702 m)   Wt 169 lb 6.4 oz (76.8 kg)   BMI 26.53 kg/m      Assessment & Plan:  RUBIE FICCO comes in today with chief complaint of check for a thyroid nodule   Diagnosis and orders addressed:  1. Neck swelling - US THYROID; Future - Thyroid Panel With TSH  2. Goiter  3. Hypothyroidism, unspecified type  [place] pending Labs pending Continue Levothyroxine Keep follow up   Evelina Dun, FNP

## 2019-04-09 LAB — THYROID PANEL WITH TSH
Free Thyroxine Index: 1.7 (ref 1.2–4.9)
T3 Uptake Ratio: 26 % (ref 24–39)
T4, Total: 6.6 ug/dL (ref 4.5–12.0)
TSH: 3.08 u[IU]/mL (ref 0.450–4.500)

## 2019-04-16 ENCOUNTER — Other Ambulatory Visit: Payer: Self-pay

## 2019-04-16 ENCOUNTER — Ambulatory Visit (HOSPITAL_COMMUNITY)
Admission: RE | Admit: 2019-04-16 | Discharge: 2019-04-16 | Disposition: A | Discharge status: Home / Self Care | Payer: No Typology Code available for payment source | Source: Ambulatory Visit | Attending: Family | Admitting: Family

## 2019-04-16 DIAGNOSIS — R221 Localized swelling, mass and lump, neck: Secondary | ICD-10-CM | POA: Diagnosis present

## 2019-05-07 MED FILL — ALPRAZolam 0.5 MG TABS: 0.5 | 30 days supply | Qty: 60 | Fill #1

## 2019-05-07 MED FILL — SERTRALINE HCL 100 MG TAB: 100 | 90 days supply | Qty: 90 | Fill #0

## 2019-05-31 ENCOUNTER — Other Ambulatory Visit: Payer: Self-pay

## 2019-05-31 DIAGNOSIS — Z20822 Contact with and (suspected) exposure to covid-19: Secondary | ICD-10-CM

## 2019-06-01 LAB — NOVEL CORONAVIRUS, NAA: SARS-CoV-2, NAA: NOT DETECTED

## 2019-06-07 ENCOUNTER — Ambulatory Visit (INDEPENDENT_AMBULATORY_CARE_PROVIDER_SITE_OTHER): Payer: No Typology Code available for payment source | Admitting: Family Medicine

## 2019-06-07 ENCOUNTER — Encounter: Payer: Self-pay | Admitting: Family Medicine

## 2019-06-07 VITALS — BP 93/59 | HR 79 | Temp 96.4°F | Resp 20 | Ht 67.0 in | Wt 169.0 lb

## 2019-06-07 DIAGNOSIS — S0501XA Injury of conjunctiva and corneal abrasion without foreign body, right eye, initial encounter: Secondary | ICD-10-CM | POA: Diagnosis not present

## 2019-06-07 MED ORDER — POLYMYXIN B-TRIMETHOPRIM 10000-0.1 UNIT/ML-% OP SOLN: 2.0000 [drp] | Freq: Four times a day (QID) | OPHTHALMIC | 0 refills | Status: AC

## 2019-06-07 NOTE — Patient Instructions (Signed)
Corneal Abrasion  A corneal abrasion is a scratch or injury to the clear covering over the front of your eye (cornea). This can be painful. It is important to get treatment for a corneal abrasion. If this problem is not treated, it can affect your eyesight (vision). Follow these instructions at home: Medicines  Use eye drops or ointments as told by your doctor.  If you were prescribed antibiotic drops or ointment, use them as told by your doctor. Do not stop using the antibiotic even if you start to feel better.  Take over-the-counter and prescription medicines only as told by your doctor.  Do not drive or use heavy machinery while taking prescription pain medicine. General instructions  If you have an eye patch, wear it as told by your doctor. ? Do not drive or use machinery while wearing an eye patch. ? Follow instructions from your doctor about when to take off the patch.  Ask your doctor if you can use a cold, wet cloth (compress) on your eye to help with pain.  Do not rub or touch your eye. Do not wash out your eye.  Do not wear contact lenses until your doctor says that this is okay.  Avoid bright light.  Avoid straining your eyes.  Keep all follow-up visits as told by your doctor. Doing this can help to prevent infection and loss of eyesight. Contact a doctor if:  You continue to have eye pain and other symptoms for more than 2 days.  You get new symptoms, such as: ? Redness. ? Watery eyes (tearing). ? Discharge.  You have discharge that makes your eyelids stick together in the morning.  Symptoms come back after your eye heals. Get help right away if:  You have very bad eye pain that does not get better with medicine.  You lose eyesight. Summary  A corneal abrasion is a scratch or injury to the clear covering over the front of your eye (cornea).  It is important to get treatment for a corneal abrasion. If this problem is not treated, it can affect your  eyesight (vision).  Use eye drops or ointments as told by your doctor.  If you have an eye patch, do not drive or use machinery while wearing it. This information is not intended to replace advice given to you by your health care provider. Make sure you discuss any questions you have with your health care provider. Document Released: 01/22/2008 Document Revised: 07/20/2016 Document Reviewed: 07/20/2016 Elsevier Interactive Patient Education  [employer].

## 2019-06-07 NOTE — Progress Notes (Signed)
Subjective:  Patient ID: Katherine Hays, female    DOB: [birth year], 48 y.o.   MRN: [id number]  Patient Care Team: Sharion Balloon, FNP as PCP - General (Nurse Practitioner)   Chief Complaint:  eye irritation   HPI: Katherine Hays is a 48 y.o. female presenting on 06/07/2019 for eye irritation   Right eye redness and irritation. Onset this weekend. States she felt like something was in her eye and she was rubbing it. States her eye was tearing a lot and felt scratchy. No visual changes. No fever, chills, rash, rhinorrhea, postnasal drainage, or other symptoms. She has not tried anything for symptoms.   Eye Problem  The right eye is affected. This is a new problem. The current episode started in the past 7 days. The problem has been unchanged. The injury mechanism is unknown. The pain is at a severity of 1/10. The pain is mild. There is no known exposure to pink eye. She does not wear contacts. Associated symptoms include an eye discharge, eye redness, a foreign body sensation and itching. Pertinent negatives include no blurred vision, double vision, fever, nausea, photophobia, recent URI or vomiting. She has tried nothing for the symptoms.       Relevant past medical, surgical, family, and social history reviewed and updated as indicated.  Allergies and medications reviewed and updated. Date reviewed: Chart in Epic.   Past Medical History:  Diagnosis Date  . Anxiety   . Asthma   . OCD (obsessive compulsive disorder)     Past Surgical History:  Procedure Laterality Date  . TONSILLECTOMY    . TUBAL LIGATION    . WISDOM TOOTH EXTRACTION      Social History   Socioeconomic History  . Marital status: Married    Spouse name: Not on file  . Number of children: 3  . Years of education: Not on file  . Highest education level: Not on file  Occupational History  . Occupation: Licensed conveyancer: [employer]  . Financial resource strain: Not on  file  . Food insecurity    Worry: Not on file    Inability: Not on file  . Transportation needs    Medical: Not on file    Non-medical: Not on file  Tobacco Use  . Smoking status: Never Smoker  . Smokeless tobacco: Never Used  Substance and Sexual Activity  . Alcohol use: No  . Drug use: No  . Sexual activity: Yes  Lifestyle  . Physical activity    Days per week: Not on file    Minutes per session: Not on file  . Stress: Not on file  Relationships  . Social Herbalist on phone: Not on file    Gets together: Not on file    Attends religious service: Not on file    Active member of club or organization: Not on file    Attends meetings of clubs or organizations: Not on file    Relationship status: Not on file  . Intimate partner violence    Fear of current or ex partner: Not on file    Emotionally abused: Not on file    Physically abused: Not on file    Forced sexual activity: Not on file  Other Topics Concern  . Not on file  Social History Narrative  . Not on file    Outpatient Encounter Medications as of 06/07/2019  Medication Sig  .  ALPRAZolam (XANAX) 0.5 MG tablet TAKE  (1)  TABLET TWICE A DAY.  Marland Kitchen levonorgestrel (MIRENA) 20 MCG/24HR IUD 1 each by Intrauterine route once.  Marland Kitchen levothyroxine (SYNTHROID) 50 MCG tablet TAKE 1 TABLET BY MOUTH DAILY BEFORE BREAKFAST.  . pantoprazole (PROTONIX) 20 MG tablet Take 1 tablet (20 mg total) by mouth daily.  . sertraline (ZOLOFT) 100 MG tablet Take 1 tablet (100 mg total) by mouth daily.  . traZODone (DESYREL) 50 MG tablet Take 0.5-1 tablets (25-50 mg total) by mouth at bedtime as needed for sleep.  Marland Kitchen trimethoprim-polymyxin b (POLYTRIM) ophthalmic solution Place 2 drops into the right eye 4 (four) times daily for 7 days.   No facility-administered encounter medications on file as of 06/07/2019.     Allergies  Allergen Reactions  . Codeine     Review of Systems  Constitutional: Negative for activity change, appetite  change, chills, diaphoresis, fatigue, fever and unexpected weight change.  HENT: Negative.   Eyes: Positive for discharge, redness and itching. Negative for blurred vision, double vision, photophobia, pain and visual disturbance.  Respiratory: Negative for cough, chest tightness and shortness of breath.   Cardiovascular: Negative for chest pain, palpitations and leg swelling.  Gastrointestinal: Negative for blood in stool, constipation, diarrhea, nausea and vomiting.  Endocrine: Negative.   Genitourinary: Negative for dysuria, frequency and urgency.  Musculoskeletal: Negative for arthralgias and myalgias.  Skin: Negative.   Allergic/Immunologic: Negative.   Neurological: Negative for dizziness and headaches.  Hematological: Negative.   Psychiatric/Behavioral: Negative for confusion, hallucinations, sleep disturbance and suicidal ideas.  All other systems reviewed and are negative.       Objective:  BP (!) 93/59   Pulse 79   Temp (!) 96.4 F (35.8 C)   Resp 20   Ht 5\' 7"  (1.702 m)   Wt 169 lb (76.7 kg)   SpO2 98%   BMI 26.47 kg/m    Wt Readings from Last 3 Encounters:  06/07/19 169 lb (76.7 kg)  04/08/19 169 lb 6.4 oz (76.8 kg)  02/18/19 170 lb (77.1 kg)    Physical Exam Vitals signs and nursing note reviewed.  Constitutional:      General: She is not in acute distress.    Appearance: Normal appearance. She is well-developed and well-groomed. She is not ill-appearing, toxic-appearing or diaphoretic.  HENT:     Head: Normocephalic and atraumatic.     Jaw: There is normal jaw occlusion.     Right Ear: Hearing normal.     Left Ear: Hearing normal.     Nose: Nose normal.     Mouth/Throat:     Lips: Pink.     Mouth: Mucous membranes are moist.     Pharynx: Oropharynx is clear. Uvula midline.  Eyes:     General: Lids are normal. Lids are everted, no foreign bodies appreciated. Gaze aligned appropriately. No visual field deficit.       Right eye: Discharge (clear)  present. No foreign body or hordeolum.        Left eye: No foreign body, discharge or hordeolum.     Extraocular Movements: Extraocular movements intact.     Right eye: Abnormal extraocular motion and nystagmus present.     Left eye: Normal extraocular motion and no nystagmus.     Conjunctiva/sclera:     Right eye: Right conjunctiva is injected. No chemosis, exudate or hemorrhage.    Left eye: Left conjunctiva is not injected. No chemosis, exudate or hemorrhage.    Pupils: Pupils are  equal, round, and reactive to light.   Neck:     Musculoskeletal: Normal range of motion and neck supple.     Thyroid: No thyroid mass, thyromegaly or thyroid tenderness.     Vascular: No carotid bruit or JVD.     Trachea: Trachea and phonation normal.  Cardiovascular:     Rate and Rhythm: Normal rate and regular rhythm.     Chest Wall: PMI is not displaced.     Pulses: Normal pulses.     Heart sounds: Normal heart sounds. No murmur. No friction rub. No gallop.   Pulmonary:     Effort: Pulmonary effort is normal. No respiratory distress.     Breath sounds: Normal breath sounds. No wheezing.  Abdominal:     General: Bowel sounds are normal. There is no distension or abdominal bruit.     Palpations: Abdomen is soft. There is no hepatomegaly or splenomegaly.     Tenderness: There is no abdominal tenderness. There is no right CVA tenderness or left CVA tenderness.     Hernia: No hernia is present.  Musculoskeletal: Normal range of motion.     Right lower leg: No edema.     Left lower leg: No edema.  Lymphadenopathy:     Cervical: No cervical adenopathy.  Skin:    General: Skin is warm and dry.     Capillary Refill: Capillary refill takes less than 2 seconds.     Coloration: Skin is not cyanotic, jaundiced or pale.     Findings: No rash.  Neurological:     General: No focal deficit present.     Mental Status: She is alert and oriented to person, place, and time.     Cranial Nerves: Cranial nerves are  intact.     Sensory: Sensation is intact.     Motor: Motor function is intact.     Coordination: Coordination is intact.     Gait: Gait is intact.     Deep Tendon Reflexes: Reflexes are normal and symmetric.  Psychiatric:        Attention and Perception: Attention and perception normal.        Mood and Affect: Mood and affect normal.        Speech: Speech normal.        Behavior: Behavior normal. Behavior is cooperative.        Thought Content: Thought content normal.        Cognition and Memory: Cognition and memory normal.        Judgment: Judgment normal.     Results for orders placed or performed in visit on 05/31/19  Novel Coronavirus, NAA ([facility])   Specimen: Nasopharyngeal(NP) swabs in vial transport medium   NASOPHARYNGE  TESTING  Result Value Ref Range   SARS-CoV-2, NAA Not Detected Not Detected       Pertinent labs & imaging results that were available during my care of the patient were reviewed by me and considered in my medical decision making.  Assessment & Plan:  Dezaria was seen today for eye irritation.  Diagnoses and all orders for this visit:  Abrasion of right cornea, initial encounter Small abrasion noted to right medial cornea. Will treat with below. Symptomatic care discussed. Report any new or worsening symptoms.  -     trimethoprim-polymyxin b (POLYTRIM) ophthalmic solution; Place 2 drops into the right eye 4 (four) times daily for 7 days.     Continue all other maintenance medications.  Follow up plan: Return if symptoms  worsen or fail to improve.  Continue healthy lifestyle choices, including diet (rich in fruits, vegetables, and lean proteins, and low in salt and simple carbohydrates) and exercise (at least 30 minutes of moderate physical activity daily).  Educational handout given for corneal abrasion  The above assessment and management plan was discussed with the patient. The patient verbalized understanding of and has agreed to the  management plan. Patient is aware to call the clinic if they develop any new symptoms or if symptoms persist or worsen. Patient is aware when to return to the clinic for a follow-up visit. Patient educated on when it is appropriate to go to the emergency department.   Monia Pouch, FNP-C [facility] [phone]

## 2019-07-08 MED FILL — ALPRAZolam 0.5 MG TABS: 0.5 | 30 days supply | Qty: 60 | Fill #2

## 2019-07-13 ENCOUNTER — Ambulatory Visit (INDEPENDENT_AMBULATORY_CARE_PROVIDER_SITE_OTHER): Payer: No Typology Code available for payment source | Admitting: Family Medicine

## 2019-07-13 DIAGNOSIS — J0110 Acute frontal sinusitis, unspecified: Secondary | ICD-10-CM | POA: Diagnosis not present

## 2019-07-13 MED ORDER — AZITHROMYCIN 250 MG PO TABS: ORAL_TABLET | ORAL | 0 refills | Status: DC

## 2019-07-13 MED ORDER — FLUTICASONE PROPIONATE 50 MCG/ACT NA SUSP: 2.0000 | Freq: Every day | NASAL | 6 refills | Status: DC

## 2019-07-13 NOTE — Patient Instructions (Signed)

## 2019-07-13 NOTE — Progress Notes (Signed)
Telephone visit  Subjective: CC: URI PCP: Sharion Balloon, FNP [id number] P Katherine Hays is a 48 y.o. female calls for telephone consult today. Patient provides verbal consent for consult held via phone.  Location of patient: home Location of provider: Working remotely from home Others present for call: none  1.  Upper respiratory infection Patient reports onset several days ago.  She has been tested for COVID-19 twice and tested negative.  She is since returned to work.  She notes symptoms are primarily within the sinuses, describing them as sinus pressure, headache and fullness.  No purulent drainage noted.  She notes that she gets something similar every year which often will move down to her chest and become a bronchitis.  She is not been having any overt, harsh coughing spells but she does note a tickle in her throat that triggers a cough.  No hemoptysis, shortness of breath.  No high fevers.  She overall just feels under the weather.  She has been using a nasal spray without significant improvement.  No other medications used thus far.  ROS: Per HPI  Allergies  Allergen Reactions  . Codeine    Past Medical History:  Diagnosis Date  . Anxiety   . Asthma   . OCD (obsessive compulsive disorder)     Current Outpatient Medications:  .  ALPRAZolam (XANAX) 0.5 MG tablet, TAKE  (1)  TABLET TWICE A DAY., Disp: 60 tablet, Rfl: 5 .  levonorgestrel (MIRENA) 20 MCG/24HR IUD, 1 each by Intrauterine route once., Disp: , Rfl:  .  levothyroxine (SYNTHROID) 50 MCG tablet, TAKE 1 TABLET BY MOUTH DAILY BEFORE BREAKFAST., Disp: 90 tablet, Rfl: 3 .  pantoprazole (PROTONIX) 20 MG tablet, Take 1 tablet (20 mg total) by mouth daily., Disp: 90 tablet, Rfl: 4 .  sertraline (ZOLOFT) 100 MG tablet, Take 1 tablet (100 mg total) by mouth daily., Disp: 90 tablet, Rfl: 3 .  traZODone (DESYREL) 50 MG tablet, Take 0.5-1 tablets (25-50 mg total) by mouth at bedtime as needed for sleep., Disp: 90 tablet, Rfl:  3  Assessment/ Plan: 48 y.o. female   1. Acute non-recurrent frontal sinusitis Because we are going into the holidays, we will empirically treat with oral antibiotics given ongoing symptoms.  Z-Pak and Flonase sent.  Home care instructions were reviewed with the patient.  Reasons for return discussed.  She voiced good understanding of follow-up as needed - azithromycin (ZITHROMAX Z-PAK) 250 MG tablet; As directed  Dispense: 6 tablet; Refill: 0 - fluticasone (FLONASE) 50 MCG/ACT nasal spray; Place 2 sprays into both nostrils daily.  Dispense: 16 g; Refill: 6   Start time: 8:15am End time: 8:22am  Total time spent on patient care (including telephone call/ virtual visit): 12 minutes  [place], [place] [phone]

## 2019-08-05 ENCOUNTER — Telehealth: Payer: Self-pay | Admitting: Family

## 2019-08-14 MED FILL — LEVOTHYROXINE 50 MCG TABLET: 50 | 90 days supply | Qty: 90 | Fill #1

## 2019-08-23 ENCOUNTER — Other Ambulatory Visit: Payer: Self-pay | Admitting: Family

## 2019-08-23 DIAGNOSIS — F411 Generalized anxiety disorder: Secondary | ICD-10-CM

## 2019-08-23 DIAGNOSIS — F1320 Sedative, hypnotic or anxiolytic dependence, uncomplicated: Secondary | ICD-10-CM

## 2019-08-23 DIAGNOSIS — Z79899 Other long term (current) drug therapy: Secondary | ICD-10-CM

## 2019-09-23 ENCOUNTER — Encounter: Payer: Self-pay | Admitting: Family

## 2019-09-23 ENCOUNTER — Ambulatory Visit (INDEPENDENT_AMBULATORY_CARE_PROVIDER_SITE_OTHER): Payer: No Typology Code available for payment source | Admitting: Family

## 2019-09-23 DIAGNOSIS — F1320 Sedative, hypnotic or anxiolytic dependence, uncomplicated: Secondary | ICD-10-CM | POA: Diagnosis not present

## 2019-09-23 DIAGNOSIS — E039 Hypothyroidism, unspecified: Secondary | ICD-10-CM | POA: Diagnosis not present

## 2019-09-23 DIAGNOSIS — Z79899 Other long term (current) drug therapy: Secondary | ICD-10-CM

## 2019-09-23 DIAGNOSIS — K219 Gastro-esophageal reflux disease without esophagitis: Secondary | ICD-10-CM

## 2019-09-23 DIAGNOSIS — F411 Generalized anxiety disorder: Secondary | ICD-10-CM

## 2019-09-23 DIAGNOSIS — G4700 Insomnia, unspecified: Secondary | ICD-10-CM

## 2019-09-23 MED ORDER — ALPRAZOLAM 0.5 MG PO TABS: ORAL_TABLET | ORAL | 5 refills | Status: DC

## 2019-09-23 MED FILL — ALPRAZolam 0.5 MG TABS: 0.5 | 30 days supply | Qty: 60 | Fill #0

## 2019-09-23 NOTE — Progress Notes (Signed)
Virtual Visit via telephone Note Due to COVID-19 pandemic this visit was conducted virtually. This visit type was conducted due to national recommendations for restrictions regarding the COVID-19 Pandemic (e.g. social distancing, sheltering in place) in an effort to limit this patient's exposure and mitigate transmission in our community. All issues noted in this document were discussed and addressed.  A physical exam was not performed with this format.  I connected with Katherine Hays on 09/23/19 at 10:07 AM by telephone and verified that I am speaking with the correct person using two identifiers. Katherine Hays is currently located at work and no one is currently with her during visit. The provider, Evelina Dun, FNP is located in their office at time of visit.  I discussed the limitations, risks, security and privacy concerns of performing an evaluation and management service by telephone and the availability of in person appointments. I also discussed with the patient that there may be a patient responsible charge related to this service. The patient expressed understanding and agreed to proceed.   History and Present Illness:   Pt calls the office today for chronic follow up. She reports the trazodone made her dizziness and felt weird. She stopped this and restarted her xanax as bedtime.   Anxiety Presents for follow-up visit. Symptoms include excessive worry, insomnia, irritability, nervous/anxious behavior and restlessness. Patient reports no depressed mood. Symptoms occur occasionally. The severity of symptoms is moderate.    Thyroid Problem Presents for follow-up visit. Symptoms include anxiety. Patient reports no depressed mood or fatigue. The symptoms have been stable.  Insomnia Primary symptoms: difficulty falling asleep, frequent awakening.  The current episode started more than one year. The onset quality is gradual. The problem occurs intermittently.    Review  of Systems  Constitutional: Positive for irritability. Negative for fatigue.  Psychiatric/Behavioral: The patient is nervous/anxious and has insomnia.   All other systems reviewed and are negative.    Observations/Objective: No SOB or distress noted   Assessment and Plan: Katherine Hays comes in today with chief complaint of No chief complaint on file.   Diagnosis and orders addressed:  1. Hypothyroidism, unspecified type  2. Gastroesophageal reflux disease, unspecified whether esophagitis present  3. Benzodiazepine dependence (HCC) - ALPRAZolam (XANAX) 0.5 MG tablet; TAKE  (1)  TABLET TWICE A DAY.  Dispense: 60 tablet; Refill: 5  4. Controlled substance agreement signed PT reviewed in [place] controlled database- No red flags noted  PT contract and drug screen noted - ALPRAZolam (XANAX) 0.5 MG tablet; TAKE  (1)  TABLET TWICE A DAY.  Dispense: 60 tablet; Refill: 5  5. GAD (generalized anxiety disorder) - ALPRAZolam (XANAX) 0.5 MG tablet; TAKE  (1)  TABLET TWICE A DAY.  Dispense: 60 tablet; Refill: 5  6. Insomnia, unspecified type   Labs pending Health Maintenance reviewed Diet and exercise encouraged  Follow up plan: 6 months       I discussed the assessment and treatment plan with the patient. The patient was provided an opportunity to ask questions and all were answered. The patient agreed with the plan and demonstrated an understanding of the instructions.   The patient was advised to call back or seek an in-person evaluation if the symptoms worsen or if the condition fails to improve as anticipated.  The above assessment and management plan was discussed with the patient. The patient verbalized understanding of and has agreed to the management plan. Patient is aware to call the clinic if symptoms persist or  worsen. Patient is aware when to return to the clinic for a follow-up visit. Patient educated on when it is appropriate to go to the emergency department.    Time call ended:  10:27 AM  I provided 20 minutes of non-face-to-face time during this encounter.    Evelina Dun, FNP

## 2019-12-02 MED FILL — ALPRAZolam 0.5 MG TABS: 0.5 | 30 days supply | Qty: 60 | Fill #1

## 2019-12-02 MED FILL — SERTRALINE HCL 100 MG TAB: 100 | 90 days supply | Qty: 90 | Fill #2

## 2019-12-27 ENCOUNTER — Other Ambulatory Visit: Payer: Self-pay

## 2019-12-27 ENCOUNTER — Ambulatory Visit (INDEPENDENT_AMBULATORY_CARE_PROVIDER_SITE_OTHER): Payer: No Typology Code available for payment source | Admitting: Family Medicine

## 2019-12-27 VITALS — BP 116/70 | HR 69 | Temp 98.2°F | Ht 67.0 in | Wt 169.0 lb

## 2019-12-27 DIAGNOSIS — M545 Low back pain, unspecified: Secondary | ICD-10-CM

## 2019-12-27 DIAGNOSIS — N3000 Acute cystitis without hematuria: Secondary | ICD-10-CM

## 2019-12-27 LAB — MICROSCOPIC EXAMINATION
Renal Epithel, UA: NONE SEEN /hpf
WBC, UA: >30 /hpf — AB (ref 0–5)

## 2019-12-27 LAB — URINALYSIS, ROUTINE W REFLEX MICROSCOPIC
Bilirubin, UA: NEGATIVE
Glucose, UA: NEGATIVE
Ketones, UA: NEGATIVE
Nitrite, UA: NEGATIVE
Protein,UA: NEGATIVE
Specific Gravity, UA: 1.025 (ref 1.005–1.030)
Urobilinogen, Ur: 0.2 mg/dL (ref 0.2–1.0)
pH, UA: 5.5 (ref 5.0–7.5)

## 2019-12-27 MED ORDER — CEPHALEXIN 500 MG PO CAPS: 500.0000 mg | ORAL_CAPSULE | Freq: Four times a day (QID) | ORAL | 0 refills | Status: DC

## 2019-12-27 MED ORDER — FLUCONAZOLE 150 MG PO TABS: 150.0000 mg | ORAL_TABLET | Freq: Once | ORAL | 0 refills | Status: DC

## 2019-12-27 MED ORDER — FLUCONAZOLE 150 MG PO TABS: 150.0000 mg | ORAL_TABLET | Freq: Once | ORAL | 0 refills | Status: AC

## 2019-12-27 NOTE — Progress Notes (Signed)
BP 116/70   Pulse 69   Temp 98.2 F (36.8 C)   Ht 5\' 7"  (1.702 m)   Wt 169 lb (76.7 kg)   SpO2 99%   BMI 26.47 kg/m    Subjective:   Patient ID: Katherine Hays, female    DOB: [birth year], 49 y.o.   MRN: [id number]  HPI: Katherine Hays is a 49 y.o. female presenting on 12/27/2019 for Back Pain and Urinary Frequency   HPI Patient comes in complaining of a little bit of back pain that started a week ago when she was lifting a couch but that was not as severe but then over the past 2 days over the weekend it is gotten more severe and especially more on the left side and she has had some frequency and urgency but no burning when she urinates.  She denies any vaginal discharge or irritation or bleeding.  Relevant past medical, surgical, family and social history reviewed and updated as indicated. Interim medical history since our last visit reviewed. Allergies and medications reviewed and updated.  Review of Systems  Constitutional: Negative for chills and fever.  Eyes: Negative for visual disturbance.  Respiratory: Negative for chest tightness and shortness of breath.   Cardiovascular: Negative for chest pain and leg swelling.  Gastrointestinal: Negative for abdominal pain.  Genitourinary: Positive for flank pain, frequency and urgency. Negative for difficulty urinating, dysuria, hematuria, vaginal bleeding, vaginal discharge and vaginal pain.  Musculoskeletal: Negative for back pain and gait problem.  Skin: Negative for rash.  Neurological: Negative for light-headedness and headaches.  Psychiatric/Behavioral: Negative for agitation and behavioral problems.  All other systems reviewed and are negative.   Per HPI unless specifically indicated above   Allergies as of 12/27/2019      Reactions   Codeine       Medication List       Accurate as of Dec 27, 2019 11:46 AM. If you have any questions, ask your nurse or doctor.        ALPRAZolam 0.5 MG tablet Commonly  known as: XANAX TAKE  (1)  TABLET TWICE A DAY.   fluticasone 50 MCG/ACT nasal spray Commonly known as: FLONASE Place 2 sprays into both nostrils daily.   levonorgestrel 20 MCG/24HR IUD Commonly known as: MIRENA 1 each by Intrauterine route once.   levothyroxine 50 MCG tablet Commonly known as: SYNTHROID TAKE 1 TABLET BY MOUTH DAILY BEFORE BREAKFAST.   sertraline 100 MG tablet Commonly known as: ZOLOFT Take 1 tablet (100 mg total) by mouth daily.        Objective:   BP 116/70   Pulse 69   Temp 98.2 F (36.8 C)   Ht 5\' 7"  (1.702 m)   Wt 169 lb (76.7 kg)   SpO2 99%   BMI 26.47 kg/m   Wt Readings from Last 3 Encounters:  12/27/19 169 lb (76.7 kg)  06/07/19 169 lb (76.7 kg)  04/08/19 169 lb 6.4 oz (76.8 kg)    Physical Exam Vitals and nursing note reviewed.  Constitutional:      General: She is not in acute distress.    Appearance: She is well-developed. She is not diaphoretic.  Eyes:     Conjunctiva/sclera: Conjunctivae normal.  Cardiovascular:     Rate and Rhythm: Normal rate and regular rhythm.     Heart sounds: Normal heart sounds. No murmur.  Pulmonary:     Effort: Pulmonary effort is normal. No respiratory distress.     Breath sounds: Normal  breath sounds. No wheezing.  Abdominal:     General: Bowel sounds are normal. There is no distension.     Palpations: Abdomen is soft. Abdomen is not rigid. There is no mass.     Tenderness: There is no abdominal tenderness. There is left CVA tenderness. There is no guarding or rebound.  Skin:    General: Skin is warm and dry.     Findings: No rash.  Neurological:     Mental Status: She is alert and oriented to person, place, and time.     Coordination: Coordination normal.  Psychiatric:        Behavior: Behavior normal.     Urinalysis greater than 30 WBCs, 3-10 RBCs, 0-10 epithelial cells, yeast present, 1+ blood, 1+ leukocytes, otherwise negative.  Assessment & Plan:   Problem List Items Addressed This  Visit    None    Visit Diagnoses    Acute cystitis without hematuria    -  Primary   Relevant Medications   cephALEXin (KEFLEX) 500 MG capsule   fluconazole (DIFLUCAN) 150 MG tablet   Other Relevant Orders   Urinalysis, Routine w reflex microscopic (Completed)   Urine Culture   Acute low back pain, unspecified back pain laterality, unspecified whether sciatica present       Relevant Medications   cephALEXin (KEFLEX) 500 MG capsule   fluconazole (DIFLUCAN) 150 MG tablet   Other Relevant Orders   Urinalysis, Routine w reflex microscopic (Completed)   Urine Culture      Will treat like UTI, based on symptoms and urine, if does not improve in the future then let us know.  Treat with Keflex Follow up plan: Return if symptoms worsen or fail to improve.  Counseling provided for all of the vaccine components Orders Placed This Encounter  Procedures  . Urine Culture  . Microscopic Examination  . Urinalysis, Routine w reflex microscopic    Caryl Pina, MD [place] 12/27/2019, 11:46 AM

## 2019-12-28 LAB — URINE CULTURE

## 2019-12-29 ENCOUNTER — Telehealth: Payer: Self-pay | Admitting: Family Medicine

## 2019-12-29 MED ORDER — CIPROFLOXACIN HCL 500 MG PO TABS
500.0000 mg | ORAL_TABLET | Freq: Two times a day (BID) | ORAL | 0 refills | Status: DC
Start: 2019-12-29 — End: 2020-02-23

## 2019-12-29 NOTE — Telephone Encounter (Signed)
Patient had televist Monday. Dx with UTI. Pt is at home sick today complains lower back pain on left side and left lower pelvic/stomach pain throbbing. Meds are not helping. Patient id having Chills. Denies fever. Could not sleep all night. Pain is worse when laying down.

## 2019-12-29 NOTE — Telephone Encounter (Signed)
I sent ciprofloxacin for her now, it is stronger, have her go ahead and do that, if not improving within 24 hours then go to the emergency department, if worsens go to the emergency department.

## 2019-12-29 NOTE — Telephone Encounter (Signed)
Patient aware and verbalized understanding. °

## 2019-12-30 ENCOUNTER — Ambulatory Visit (HOSPITAL_COMMUNITY)
Admission: RE | Admit: 2019-12-30 | Discharge: 2019-12-30 | Disposition: A | Discharge status: Home / Self Care | Payer: No Typology Code available for payment source | Source: Ambulatory Visit | Attending: Family Medicine | Admitting: Family Medicine

## 2019-12-30 ENCOUNTER — Telehealth: Payer: Self-pay | Admitting: Family

## 2019-12-30 ENCOUNTER — Other Ambulatory Visit: Payer: Self-pay

## 2019-12-30 DIAGNOSIS — R1012 Left upper quadrant pain: Secondary | ICD-10-CM | POA: Diagnosis not present

## 2019-12-30 NOTE — Telephone Encounter (Signed)
Patient wants CT today!

## 2019-12-30 NOTE — Telephone Encounter (Signed)
Placed stat order for CT scan of abdomen for kidney stones

## 2020-01-04 ENCOUNTER — Telehealth: Payer: Self-pay | Admitting: Family Medicine

## 2020-01-04 MED ORDER — DICLOFENAC SODIUM 75 MG PO TBEC
75.0000 mg | DELAYED_RELEASE_TABLET | Freq: Two times a day (BID) | ORAL | 0 refills | Status: DC
Start: 2020-01-04 — End: 2020-02-23

## 2020-01-04 MED ORDER — BACLOFEN 10 MG PO TABS
10.0000 mg | ORAL_TABLET | Freq: Three times a day (TID) | ORAL | 0 refills | Status: DC
Start: 2020-01-04 — End: 2020-03-14

## 2020-01-04 NOTE — Telephone Encounter (Signed)
Baclofen and diclofenac Prescription sent to pharmacy. Rest and ROM exercises encouraged.

## 2020-01-04 NOTE — Telephone Encounter (Signed)
Patient aware and verbalized understanding. °

## 2020-02-02 MED FILL — LEVOTHYROXINE 50 MCG TABLET: 50 | 90 days supply | Qty: 90 | Fill #2

## 2020-02-16 ENCOUNTER — Ambulatory Visit
Admission: RE | Admit: 2020-02-16 | Discharge: 2020-02-16 | Disposition: A | Discharge status: Home / Self Care | Payer: No Typology Code available for payment source | Source: Ambulatory Visit | Attending: Family | Admitting: Family

## 2020-02-16 ENCOUNTER — Other Ambulatory Visit: Payer: Self-pay

## 2020-02-16 ENCOUNTER — Other Ambulatory Visit: Payer: Self-pay | Admitting: Family

## 2020-02-16 DIAGNOSIS — Z1239 Encounter for other screening for malignant neoplasm of breast: Secondary | ICD-10-CM

## 2020-02-23 ENCOUNTER — Encounter: Payer: Self-pay | Admitting: Family Medicine

## 2020-02-23 ENCOUNTER — Ambulatory Visit (INDEPENDENT_AMBULATORY_CARE_PROVIDER_SITE_OTHER): Payer: No Typology Code available for payment source

## 2020-02-23 ENCOUNTER — Ambulatory Visit (INDEPENDENT_AMBULATORY_CARE_PROVIDER_SITE_OTHER): Payer: No Typology Code available for payment source | Admitting: Family Medicine

## 2020-02-23 VITALS — BP 112/65 | HR 91 | Temp 97.1°F | Ht 67.0 in | Wt 170.0 lb

## 2020-02-23 DIAGNOSIS — M62830 Muscle spasm of back: Secondary | ICD-10-CM

## 2020-02-23 DIAGNOSIS — R8271 Bacteriuria: Secondary | ICD-10-CM

## 2020-02-23 DIAGNOSIS — M545 Low back pain, unspecified: Secondary | ICD-10-CM

## 2020-02-23 LAB — URINALYSIS, COMPLETE
Bilirubin, UA: NEGATIVE
Glucose, UA: NEGATIVE
Ketones, UA: NEGATIVE
Nitrite, UA: NEGATIVE
Protein,UA: NEGATIVE
Specific Gravity, UA: 1.025 (ref 1.005–1.030)
Urobilinogen, Ur: 0.2 mg/dL (ref 0.2–1.0)
pH, UA: 5.5 (ref 5.0–7.5)

## 2020-02-23 LAB — MICROSCOPIC EXAMINATION
Epithelial Cells (non renal): >10 /hpf — AB (ref 0–10)
Renal Epithel, UA: NONE SEEN /hpf

## 2020-02-23 MED ORDER — DICLOFENAC SODIUM 75 MG PO TBEC: 75.0000 mg | DELAYED_RELEASE_TABLET | Freq: Two times a day (BID) | ORAL | 1 refills | Status: DC | PRN

## 2020-02-23 NOTE — Progress Notes (Signed)
Subjective: CC: Low back pain PCP: Sharion Balloon, FNP Katherine Hays is a 49 y.o. female presenting to clinic today for:  1.  Low back pain Patient reports that she had similar left-sided low back pain about a month ago.  She had urine specimen obtained, CT to evaluate for possible stone.  Work-up was negative from a GU standpoint.  She was ultimately treated with diclofenac and baclofen which did resolve pain.  Symptoms had totally resolved up until last night when she laid down she started hurting again.  Symptoms seem to be worse when she is in lying position.  She is not noticed that it was exacerbated by sitting or standing.  She points to the left low back.  Denies any radiation to the lower extremities.  No sensory changes, falls, weakness.  No urinary retention, fecal incontinence or saddle anesthesia.  No history of kidney stones.  Again denies any urinary symptoms including dysuria, urinary frequency, urinary urgency.  She has baclofen at home but has not yet taken anything.   ROS: Per HPI  Allergies  Allergen Reactions  . Codeine    Past Medical History:  Diagnosis Date  . Anxiety   . Asthma   . OCD (obsessive compulsive disorder)     Current Outpatient Medications:  .  ALPRAZolam (XANAX) 0.5 MG tablet, TAKE  (1)  TABLET TWICE A DAY., Disp: 60 tablet, Rfl: 5 .  baclofen (LIORESAL) 10 MG tablet, Take 1 tablet (10 mg total) by mouth 3 (three) times daily., Disp: 30 each, Rfl: 0 .  fluticasone (FLONASE) 50 MCG/ACT nasal spray, Place 2 sprays into both nostrils daily., Disp: 16 g, Rfl: 6 .  levonorgestrel (MIRENA) 20 MCG/24HR IUD, 1 each by Intrauterine route once., Disp: , Rfl:  .  levothyroxine (SYNTHROID) 50 MCG tablet, TAKE 1 TABLET BY MOUTH DAILY BEFORE BREAKFAST., Disp: 90 tablet, Rfl: 3 .  sertraline (ZOLOFT) 100 MG tablet, Take 1 tablet (100 mg total) by mouth daily., Disp: 90 tablet, Rfl: 3 Social History   Socioeconomic History  . Marital status:  Married    Spouse name: Not on file  . Number of children: 3  . Years of education: Not on file  . Highest education level: Not on file  Occupational History  . Occupation: Licensed conveyancer: [employer]  Tobacco Use  . Smoking status: Never Smoker  . Smokeless tobacco: Never Used  Vaping Use  . Vaping Use: Never used  Substance and Sexual Activity  . Alcohol use: No  . Drug use: No  . Sexual activity: Yes  Other Topics Concern  . Not on file  Social History Narrative  . Not on file   Social Determinants of Health   Financial Resource Strain:   . Difficulty of Paying Living Expenses:   Food Insecurity:   . Worried About Charity fundraiser in the Last Year:   . Arboriculturist in the Last Year:   Transportation Needs:   . Film/video editor (Medical):   Marland Kitchen Lack of Transportation (Non-Medical):   Physical Activity:   . Days of Exercise per Week:   . Minutes of Exercise per Session:   Stress:   . Feeling of Stress :   Social Connections:   . Frequency of Communication with Friends and Family:   . Frequency of Social Gatherings with Friends and Family:   . Attends Religious Services:   . Active Member of Clubs or Organizations:   .  Attends Archivist Meetings:   Marland Kitchen Marital Status:   Intimate Partner Violence:   . Fear of Current or Ex-Partner:   . Emotionally Abused:   Marland Kitchen Physically Abused:   . Sexually Abused:    Family History  Problem Relation Age of Onset  . Liver disease Father        cirrhosis  . Pancreatic cancer Maternal Uncle   . Diabetes Maternal Grandfather   . Stroke Maternal Grandfather   . Breast cancer Paternal Grandmother   . Cancer Paternal Grandmother        breast  . Heart disease Paternal Grandfather   . Stroke Paternal Grandfather   . Hyperthyroidism Mother   . Hypertension Brother   . Crohn's disease Other        Nephew    Objective: Office vital signs reviewed. BP 112/65   Pulse 91   Temp (!) 97.1 F (36.2  C)   Ht 5\' 7"  (1.702 m)   Wt 170 lb (77.1 kg)   SpO2 99%   BMI 26.63 kg/m   Physical Examination:  General: Awake, alert, well nourished, No acute distress MSK: normal gait and station  Lumbar spine: Patient has full painless active range of motion.  No midline tenderness palpation.  Minimal paraspinal muscle tenderness palpation on the left lumbosacral area.  No palpable bony abnormalities.  She actually has increased muscle tonicity on the right.  Negative straight leg raise. Neuro: 5/5 LE Strength and light touch sensation grossly intact, patellar DTRs 2/4  No results found.  Assessment/ Plan: 49 y.o. female   1. Acute left-sided low back pain without sciatica Baseline lumbar films obtained given recurrent nature of symptoms.  Personal review of the lumbar films demonstrated no significant abnormalities.  She has preserved disc spaces and no appreciable major degenerative changes.  Urinalysis was obtained which showed what appeared to be a nonclean-catch urine with epithelial cells, white blood cells and few bacteria present.  Will send for culture and treat based on culture results.  I reviewed her previous work-up which was nonrevealing.  This is likely lumbosacral spasm.  Empiric treatment with Voltaren, okay to resume muscle relaxer.  No red flags at this point but they were discussed.  Avoid NSAID with SSRI.  Offered injection therapy today but pain was not severe enough.  PT for home provided today. - diclofenac (VOLTAREN) 75 MG EC tablet; Take 1 tablet (75 mg total) by mouth 2 (two) times daily as needed for moderate pain.  Dispense: 30 tablet; Refill: 1 - DG Lumbar Spine 2-3 Views; Future  2. Lumbar paraspinal muscle spasm - diclofenac (VOLTAREN) 75 MG EC tablet; Take 1 tablet (75 mg total) by mouth 2 (two) times daily as needed for moderate pain.  Dispense: 30 tablet; Refill: 1 - DG Lumbar Spine 2-3 Views; Future  3. Bacteriuria Asymptomatic. - Urine Culture - Urinalysis,  Complete   No orders of the defined types were placed in this encounter.  No orders of the defined types were placed in this encounter.    Janora Norlander, DO [place] [phone]

## 2020-02-23 NOTE — Patient Instructions (Signed)
Take diclofenac SEPARATELY from the Sertraline (together they can increase risk of stomach bleeds).  You have prescribed a nonsteroidal anti-inflammatory drug (NSAID) today. This will help with your pain and inflammation. Please do not take any other NSAIDs (ibuprofen/Motrin/Advil, naproxen/Aleve, meloxicam/Mobic, Voltaren/diclofenac). Please make sure to eat a meal when taking this medication.   Caution:  If you have a history of acid reflux/indigestion, I recommend that you take an antacid (such as Prilosec, Prevacid) daily while on the NSAID.  If you have a history of bleeding disorder, gastric ulcer, are on a blood thinner (like warfarin/Coumadin, Xarelto, Eliquis, etc) please do not take NSAID.  If you have ever had a heart attack, you should not take NSAIDs.  Lumbosacral Strain Lumbosacral strain is an injury that causes pain in the lower back (lumbosacral spine). This injury usually happens from overstretching the muscles or ligaments along your spine. Ligaments are cord-like tissues that connect bones to other bones. A strain can affect one or more muscles or ligaments. What are the causes? This condition may be caused by:  A hard, direct hit to the back.  Overstretching the lower back muscles. This may result from: ? A fall. ? Lifting something heavy. ? Repetitive movements such as bending or crouching. What increases the risk? The following factors may make you more likely to develop this condition:  Participating in sports or activities that involve: ? A sudden twist of the back. ? Pushing or pulling motions.  Being overweight or obese.  Having poor strength and flexibility, especially tight hamstrings or weak muscles in the back or abdomen.  Having too much of a curve in the lower back.  Having a pelvis that is tilted forward. What are the signs or symptoms? The main symptom of this condition is pain in the lower back, at the site of the strain. Pain may also be felt  down one or both legs. How is this diagnosed? This condition is diagnosed based on your symptoms, your medical history, and a physical exam. During the physical exam, your health care provider may push on certain areas of your back to find the source of your pain. You may be asked to bend forward, backward, and side to side to check your pain and range of motion. You may also have imaging tests, such as X-rays and an MRI. How is this treated? This condition may be treated by:  Applying heat and cold on the affected area.  Taking medicines to help relieve pain and relax your muscles.  Taking NSAIDs, such as ibuprofen, to help reduce swelling and discomfort.  Doing stretching and strengthening exercises for your lower back. Symptoms usually improve within several weeks of treatment. However, recovery time varies. When your symptoms improve, gradually return to your normal routine as soon as possible to reduce pain, avoid stiffness, and keep muscle strength. Follow these instructions at home: Medicines  Take over-the-counter and prescription medicines only as told by your health care provider.  Ask your health care provider if the medicine prescribed to you: ? Requires you to avoid driving or using heavy machinery. ? Can cause constipation. You may need to take these actions to prevent or treat constipation:  Drink enough fluid to keep your urine pale yellow.  Take over-the-counter or prescription medicines.  Eat foods that are high in fiber, such as beans, whole grains, and fresh fruits and vegetables.  Limit foods that are high in fat and processed sugars, such as fried or sweet foods. Managing pain, stiffness,  and swelling      If directed, put ice on the injured area. To do this: ? Put ice in a plastic bag. ? Place a towel between your skin and the bag. ? Leave the ice on for 20 minutes, 2-3 times a day.  If directed, apply heat on the affected area as often as told by your  health care provider. Use the heat source that your health care provider recommends, such as a moist heat pack or a heating pad. ? Place a towel between your skin and the heat source. ? Leave the heat on for 20-30 minutes. ? Remove the heat if your skin turns bright red. This is especially important if you are unable to feel pain, heat, or cold. You may have a greater risk of getting burned. Activity  Rest as told by your health care provider.  Do not stay in bed. Staying in bed for more than 1-2 days can delay your recovery.  Return to your normal activities as told by your health care provider. Ask your health care provider what activities are safe for you.  Avoid activities that take a lot of energy for as long as told by your health care provider.  Do exercises as told by your health care provider. This includes stretching and strengthening exercises. General instructions  Sit up and stand up straight. Avoid leaning forward when you sit, or hunching over when you stand.  Do not use any products that contain nicotine or tobacco, such as cigarettes, e-cigarettes, and chewing tobacco. If you need help quitting, ask your health care provider.  Keep all follow-up visits as told by your health care provider. This is important. How is this prevented?   Use correct form when playing sports and lifting heavy objects.  Use good posture when sitting and standing.  Maintain a healthy weight.  Sleep on a mattress with medium firmness to support your back.  Do at least 150 minutes of moderate-intensity exercise each week, such as brisk walking or water aerobics. Try a form of exercise that takes stress off your back, such as swimming or stationary cycling.  Maintain physical fitness, including: ? Strength. ? Flexibility. Contact a health care provider if:  Your back pain does not improve after several weeks of treatment.  Your symptoms get worse. Get help right away if:  Your back  pain is severe.  You cannot stand or walk.  You have difficulty controlling when you urinate or when you have a bowel movement.  You feel nauseous or you vomit.  Your feet or legs get very cold, turn pale, or look blue.  You have numbness, tingling, weakness, or problems using your arms or legs.  You develop any of the following: ? Shortness of breath. ? Dizziness. ? Pain in your legs. ? Weakness in your buttocks or legs. Summary  Lumbosacral strain is an injury that causes pain in the lower back (lumbosacral spine).  This injury usually happens from overstretching the muscles or ligaments along your spine.  This condition may be caused by a direct hit to the lower back or by overstretching the lower back muscles.  Symptoms usually improve within several weeks of treatment. This information is not intended to replace advice given to you by your health care provider. Make sure you discuss any questions you have with your health care provider. Document Revised: 12/29/2018 Document Reviewed: 12/29/2018 Elsevier Patient Education  [place].

## 2020-02-25 LAB — URINE CULTURE

## 2020-02-29 MED FILL — ALPRAZolam 0.5 MG TABS: 0.5 | 30 days supply | Qty: 60 | Fill #2

## 2020-03-14 ENCOUNTER — Encounter: Payer: Self-pay | Admitting: Family

## 2020-03-14 ENCOUNTER — Ambulatory Visit (INDEPENDENT_AMBULATORY_CARE_PROVIDER_SITE_OTHER): Payer: No Typology Code available for payment source | Admitting: Family

## 2020-03-14 ENCOUNTER — Other Ambulatory Visit: Payer: Self-pay

## 2020-03-14 VITALS — BP 109/59 | HR 82 | Temp 97.7°F | Ht 67.0 in | Wt 176.0 lb

## 2020-03-14 DIAGNOSIS — Z0001 Encounter for general adult medical examination with abnormal findings: Secondary | ICD-10-CM | POA: Diagnosis not present

## 2020-03-14 DIAGNOSIS — K219 Gastro-esophageal reflux disease without esophagitis: Secondary | ICD-10-CM

## 2020-03-14 DIAGNOSIS — F411 Generalized anxiety disorder: Secondary | ICD-10-CM

## 2020-03-14 DIAGNOSIS — Z0000 Encounter for general adult medical examination without abnormal findings: Secondary | ICD-10-CM

## 2020-03-14 DIAGNOSIS — Z1159 Encounter for screening for other viral diseases: Secondary | ICD-10-CM

## 2020-03-14 DIAGNOSIS — F1320 Sedative, hypnotic or anxiolytic dependence, uncomplicated: Secondary | ICD-10-CM | POA: Diagnosis not present

## 2020-03-14 DIAGNOSIS — E039 Hypothyroidism, unspecified: Secondary | ICD-10-CM

## 2020-03-14 DIAGNOSIS — E785 Hyperlipidemia, unspecified: Secondary | ICD-10-CM

## 2020-03-14 DIAGNOSIS — Z79899 Other long term (current) drug therapy: Secondary | ICD-10-CM

## 2020-03-14 MED ORDER — ALPRAZOLAM 0.5 MG PO TABS: ORAL_TABLET | ORAL | 5 refills | Status: DC

## 2020-03-14 NOTE — Patient Instructions (Signed)
Health Maintenance, Female Adopting a healthy lifestyle and getting preventive care are important in promoting health and wellness. Ask your health care provider about:  The right schedule for you to have regular tests and exams.  Things you can do on your own to prevent diseases and keep yourself healthy. What should I know about diet, weight, and exercise? Eat a healthy diet   Eat a diet that includes plenty of vegetables, fruits, low-fat dairy products, and lean protein.  Do not eat a lot of foods that are high in solid fats, added sugars, or sodium. Maintain a healthy weight Body mass index (BMI) is used to identify weight problems. It estimates body fat based on height and weight. Your health care provider can help determine your BMI and help you achieve or maintain a healthy weight. Get regular exercise Get regular exercise. This is one of the most important things you can do for your health. Most adults should:  Exercise for at least 150 minutes each week. The exercise should increase your heart rate and make you sweat (moderate-intensity exercise).  Do strengthening exercises at least twice a week. This is in addition to the moderate-intensity exercise.  Spend less time sitting. Even light physical activity can be beneficial. Watch cholesterol and blood lipids Have your blood tested for lipids and cholesterol at 51 years of age, then have this test every 5 years. Have your cholesterol levels checked more often if:  Your lipid or cholesterol levels are high.  You are older than 51 years of age.  You are at high risk for heart disease. What should I know about cancer screening? Depending on your health history and family history, you may need to have cancer screening at various ages. This may include screening for:  Breast cancer.  Cervical cancer.  Colorectal cancer.  Skin cancer.  Lung cancer. What should I know about heart disease, diabetes, and high blood  pressure? Blood pressure and heart disease  High blood pressure causes heart disease and increases the risk of stroke. This is more likely to develop in people who have high blood pressure readings, are of African descent, or are overweight.  Have your blood pressure checked: ? Every 3-5 years if you are 18-39 years of age. ? Every year if you are 40 years old or older. Diabetes Have regular diabetes screenings. This checks your fasting blood sugar level. Have the screening done:  Once every three years after age 40 if you are at a normal weight and have a low risk for diabetes.  More often and at a younger age if you are overweight or have a high risk for diabetes. What should I know about preventing infection? Hepatitis B If you have a higher risk for hepatitis B, you should be screened for this virus. Talk with your health care provider to find out if you are at risk for hepatitis B infection. Hepatitis C Testing is recommended for:  Everyone born from 1945 through 1965.  Anyone with known risk factors for hepatitis C. Sexually transmitted infections (STIs)  Get screened for STIs, including gonorrhea and chlamydia, if: ? You are sexually active and are younger than 51 years of age. ? You are older than 51 years of age and your health care provider tells you that you are at risk for this type of infection. ? Your sexual activity has changed since you were last screened, and you are at increased risk for chlamydia or gonorrhea. Ask your health care provider if   you are at risk.  Ask your health care provider about whether you are at high risk for HIV. Your health care provider may recommend a prescription medicine to help prevent HIV infection. If you choose to take medicine to prevent HIV, you should first get tested for HIV. You should then be tested every 3 months for as long as you are taking the medicine. Pregnancy  If you are about to stop having your period (premenopausal) and  you may become pregnant, seek counseling before you get pregnant.  Take 400 to 800 micrograms (mcg) of folic acid every day if you become pregnant.  Ask for birth control (contraception) if you want to prevent pregnancy. Osteoporosis and menopause Osteoporosis is a disease in which the bones lose minerals and strength with aging. This can result in bone fractures. If you are 65 years old or older, or if you are at risk for osteoporosis and fractures, ask your health care provider if you should:  Be screened for bone loss.  Take a calcium or vitamin D supplement to lower your risk of fractures.  Be given hormone replacement therapy (HRT) to treat symptoms of menopause. Follow these instructions at home: Lifestyle  Do not use any products that contain nicotine or tobacco, such as cigarettes, e-cigarettes, and chewing tobacco. If you need help quitting, ask your health care provider.  Do not use street drugs.  Do not share needles.  Ask your health care provider for help if you need support or information about quitting drugs. Alcohol use  Do not drink alcohol if: ? Your health care provider tells you not to drink. ? You are pregnant, may be pregnant, or are planning to become pregnant.  If you drink alcohol: ? Limit how much you use to 0-1 drink a day. ? Limit intake if you are breastfeeding.  Be aware of how much alcohol is in your drink. In the [place] one drink equals one 12 oz bottle of beer (355 mL), one 5 oz glass of wine (148 mL), or one 1 oz glass of hard liquor (44 mL). General instructions  Schedule regular health, dental, and eye exams.  Stay current with your vaccines.  Tell your health care provider if: ? You often feel depressed. ? You have ever been abused or do not feel safe at home. Summary  Adopting a healthy lifestyle and getting preventive care are important in promoting health and wellness.  Follow your health care provider's instructions about healthy  diet, exercising, and getting tested or screened for diseases.  Follow your health care provider's instructions on monitoring your cholesterol and blood pressure. This information is not intended to replace advice given to you by your health care provider. Make sure you discuss any questions you have with your health care provider. Document Revised: 07/29/2018 Document Reviewed: 07/29/2018 Elsevier Patient Education  2020 [employer]  

## 2020-03-14 NOTE — Progress Notes (Signed)
Subjective:    Patient ID: Katherine Hays, female    DOB: Mar 16, 1971, 49 y.o.   MRN: [id number]  Chief Complaint  Patient presents with  . Annual Exam    spot on face.   Pt presents to the office today for CPE.  Gastroesophageal Reflux She complains of belching and heartburn. This is a chronic problem. The current episode started more than 1 year ago. The problem occurs occasionally. The problem has been waxing and waning. Associated symptoms include fatigue. She has tried an antacid for the symptoms. The treatment provided moderate relief.  Thyroid Problem Presents for follow-up visit. Symptoms include anxiety and fatigue. Patient reports no depressed mood, diarrhea or dry skin. The symptoms have been stable. Her past medical history is significant for hyperlipidemia.  Insomnia Primary symptoms: difficulty falling asleep, frequent awakening.  The current episode started more than one year. The onset quality is gradual.  Hyperlipidemia This is a chronic problem. The current episode started more than 1 year ago. The problem is uncontrolled. Recent lipid tests were reviewed and are high. Exacerbating diseases include hypothyroidism. Current antihyperlipidemic treatment includes statins. The current treatment provides moderate improvement of lipids. Risk factors for coronary artery disease include dyslipidemia and a sedentary lifestyle.  Anxiety Presents for follow-up visit. Symptoms include insomnia, irritability, nervous/anxious behavior and restlessness. Patient reports no depressed mood. Symptoms occur occasionally. The severity of symptoms is moderate. The quality of sleep is good.        Review of Systems  Constitutional: Positive for fatigue and irritability.  Gastrointestinal: Positive for heartburn. Negative for diarrhea.  Psychiatric/Behavioral: The patient is nervous/anxious and has insomnia.   All other systems reviewed and are negative.  Family History  Problem  Relation Age of Onset  . Liver disease Father        cirrhosis  . Pancreatic cancer Maternal Uncle   . Diabetes Maternal Grandfather   . Stroke Maternal Grandfather   . Breast cancer Paternal Grandmother   . Cancer Paternal Grandmother        breast  . Heart disease Paternal Grandfather   . Stroke Paternal Grandfather   . Hyperthyroidism Mother   . Hypertension Brother   . Crohn's disease Other        Nephew   Social History   Socioeconomic History  . Marital status: Married    Spouse name: Not on file  . Number of children: 3  . Years of education: Not on file  . Highest education level: Not on file  Occupational History  . Occupation: Licensed conveyancer: [employer]  Tobacco Use  . Smoking status: Never Smoker  . Smokeless tobacco: Never Used  Vaping Use  . Vaping Use: Never used  Substance and Sexual Activity  . Alcohol use: No  . Drug use: No  . Sexual activity: Yes  Other Topics Concern  . Not on file  Social History Narrative  . Not on file   Social Determinants of Health   Financial Resource Strain:   . Difficulty of Paying Living Expenses:   Food Insecurity:   . Worried About Charity fundraiser in the Last Year:   . Arboriculturist in the Last Year:   Transportation Needs:   . Film/video editor (Medical):   Marland Kitchen Lack of Transportation (Non-Medical):   Physical Activity:   . Days of Exercise per Week:   . Minutes of Exercise per Session:   Stress:   . Feeling  of Stress :   Social Connections:   . Frequency of Communication with Friends and Family:   . Frequency of Social Gatherings with Friends and Family:   . Attends Religious Services:   . Active Member of Clubs or Organizations:   . Attends Banker Meetings:   Marland Kitchen Marital Status:        Objective:   Physical Exam Vitals reviewed.  Constitutional:      General: She is not in acute distress.    Appearance: She is well-developed.  HENT:     Head: Normocephalic and  atraumatic.     Right Ear: Tympanic membrane normal.     Left Ear: Tympanic membrane normal.  Eyes:     Pupils: Pupils are equal, round, and reactive to light.  Neck:     Thyroid: No thyromegaly.  Cardiovascular:     Rate and Rhythm: Normal rate and regular rhythm.     Heart sounds: Normal heart sounds. No murmur heard.   Pulmonary:     Effort: Pulmonary effort is normal. No respiratory distress.     Breath sounds: Normal breath sounds. No wheezing.  Abdominal:     General: Bowel sounds are normal. There is no distension.     Palpations: Abdomen is soft.     Tenderness: There is no abdominal tenderness.  Musculoskeletal:        General: No tenderness. Normal range of motion.     Cervical back: Normal range of motion and neck supple.  Skin:    General: Skin is warm and dry.  Neurological:     Mental Status: She is alert and oriented to person, place, and time.     Cranial Nerves: No cranial nerve deficit.     Deep Tendon Reflexes: Reflexes are normal and symmetric.  Psychiatric:        Behavior: Behavior normal.        Thought Content: Thought content normal.        Judgment: Judgment normal.          BP (!) 109/59   Pulse 82   Temp 97.7 F (36.5 C) (Temporal)   Ht 5\' 7"  (1.702 m)   Wt 176 lb (79.8 kg)   SpO2 97%   BMI 27.57 kg/m   Assessment & Plan:  Katherine Hays comes in today with chief complaint of Annual Exam (spot on face.)   Diagnosis and orders addressed:  1. Annual physical exam - CMP14+EGFR; Future - CBC with Differential/Platelet; Future - Lipid panel; Future - TSH; Future - Hepatitis C antibody; Future - ToxASSURE Select 13 (MW), Urine  2. Gastroesophageal reflux disease, unspecified whether esophagitis present - CMP14+EGFR; Future - CBC with Differential/Platelet; Future  3. Hypothyroidism, unspecified type - CMP14+EGFR; Future - CBC with Differential/Platelet; Future  4. Benzodiazepine dependence (HCC - CMP14+EGFR; Future -  CBC with Differential/Platelet; Future - ALPRAZolam (XANAX) 0.5 MG tablet; TAKE  (1)  TABLET TWICE A DAY.  Dispense: 60 tablet; Refill: 5 - ToxASSURE Select 13 (MW), Urine  5. Controlled substance agreement signed - CMP14+EGFR; Future - CBC with Differential/Platelet; Future - ALPRAZolam (XANAX) 0.5 MG tablet; TAKE  (1)  TABLET TWICE A DAY.  Dispense: 60 tablet; Refill: 5 - ToxASSURE Select 13 (MW), Urine  6. GAD (generalized anxiety disorder) - CMP14+EGFR; Future - CBC with Differential/Platelet; Future - ALPRAZolam (XANAX) 0.5 MG tablet; TAKE  (1)  TABLET TWICE A DAY.  Dispense: 60 tablet; Refill: 5 - ToxASSURE Select 13 (MW), Urine  7.  Hyperlipidemia, unspecified hyperlipidemia type - CMP14+EGFR; Future - CBC with Differential/Platelet; Future  8. Need for hepatitis C screening test - CMP14+EGFR; Future - CBC with Differential/Platelet; Future - Hepatitis C antibody; Future   Labs pending Patient reviewed in [place] controlled database, no flags noted. Contract and drug screen updated today Health Maintenance reviewed Diet and exercise encouraged  Follow up plan: 6 months    Evelina Dun, FNP

## 2020-03-15 ENCOUNTER — Other Ambulatory Visit: Payer: No Typology Code available for payment source

## 2020-03-15 DIAGNOSIS — Z0000 Encounter for general adult medical examination without abnormal findings: Secondary | ICD-10-CM

## 2020-03-15 DIAGNOSIS — F1320 Sedative, hypnotic or anxiolytic dependence, uncomplicated: Secondary | ICD-10-CM

## 2020-03-15 DIAGNOSIS — E785 Hyperlipidemia, unspecified: Secondary | ICD-10-CM

## 2020-03-15 DIAGNOSIS — Z79899 Other long term (current) drug therapy: Secondary | ICD-10-CM

## 2020-03-15 DIAGNOSIS — Z1159 Encounter for screening for other viral diseases: Secondary | ICD-10-CM

## 2020-03-15 DIAGNOSIS — K219 Gastro-esophageal reflux disease without esophagitis: Secondary | ICD-10-CM

## 2020-03-15 DIAGNOSIS — F411 Generalized anxiety disorder: Secondary | ICD-10-CM

## 2020-03-15 DIAGNOSIS — E039 Hypothyroidism, unspecified: Secondary | ICD-10-CM

## 2020-03-16 LAB — LIPID PANEL
Chol/HDL Ratio: 4.4 ratio (ref 0.0–4.4)
Cholesterol, Total: 185 mg/dL (ref 100–199)
HDL: 42 mg/dL (ref >39)
LDL Chol Calc (NIH): 124 mg/dL — ABNORMAL HIGH (ref 0–99)
Triglycerides: 103 mg/dL (ref 0–149)
VLDL Cholesterol Cal: 19 mg/dL (ref 5–40)

## 2020-03-16 LAB — CBC WITH DIFFERENTIAL/PLATELET
Basophils Absolute: 0 10*3/uL (ref 0.0–0.2)
Basos: 1 %
EOS (ABSOLUTE): 0.2 10*3/uL (ref 0.0–0.4)
Eos: 3 %
Hematocrit: 41.8 % (ref 34.0–46.6)
Hemoglobin: 13.7 g/dL (ref 11.1–15.9)
Immature Grans (Abs): 0 10*3/uL (ref 0.0–0.1)
Immature Granulocytes: 0 %
Lymphocytes Absolute: 1.8 10*3/uL (ref 0.7–3.1)
Lymphs: 28 %
MCH: 29.2 pg (ref 26.6–33.0)
MCHC: 32.8 g/dL (ref 31.5–35.7)
MCV: 89 fL (ref 79–97)
Monocytes Absolute: 0.5 10*3/uL (ref 0.1–0.9)
Monocytes: 8 %
Neutrophils Absolute: 3.8 10*3/uL (ref 1.4–7.0)
Neutrophils: 60 %
Platelets: 345 10*3/uL (ref 150–450)
RBC: 4.69 x10E6/uL (ref 3.77–5.28)
RDW: 12.5 % (ref 11.7–15.4)
WBC: 6.3 10*3/uL (ref 3.4–10.8)

## 2020-03-16 LAB — CMP14+EGFR
ALT: 16 IU/L (ref 0–32)
AST: 18 IU/L (ref 0–40)
Albumin/Globulin Ratio: 2 (ref 1.2–2.2)
Albumin: 4.6 g/dL (ref 3.8–4.8)
Alkaline Phosphatase: 82 IU/L (ref 48–121)
BUN/Creatinine Ratio: 14 (ref 9–23)
BUN: 10 mg/dL (ref 6–24)
Bilirubin Total: 0.3 mg/dL (ref 0.0–1.2)
CO2: 22 mmol/L (ref 20–29)
Calcium: 9.1 mg/dL (ref 8.7–10.2)
Chloride: 101 mmol/L (ref 96–106)
Creatinine, Ser: 0.72 mg/dL (ref 0.57–1.00)
GFR calc Af Amer: 113 mL/min/{1.73_m2} (ref >59)
GFR calc non Af Amer: 98 mL/min/{1.73_m2} (ref >59)
Globulin, Total: 2.3 g/dL (ref 1.5–4.5)
Glucose: 100 mg/dL — ABNORMAL HIGH (ref 65–99)
Potassium: 4.7 mmol/L (ref 3.5–5.2)
Sodium: 138 mmol/L (ref 134–144)
Total Protein: 6.9 g/dL (ref 6.0–8.5)

## 2020-03-16 LAB — HEPATITIS C ANTIBODY: Hep C Virus Ab: <0.1 s/co ratio (ref 0.0–0.9)

## 2020-03-16 LAB — TSH: TSH: 2.39 u[IU]/mL (ref 0.450–4.500)

## 2020-03-18 LAB — TOXASSURE SELECT 13 (MW), URINE

## 2020-03-23 ENCOUNTER — Other Ambulatory Visit: Payer: Self-pay | Admitting: Family Medicine

## 2020-03-23 MED ORDER — FLUCONAZOLE 150 MG PO TABS: 150.0000 mg | ORAL_TABLET | Freq: Once | ORAL | 0 refills | Status: AC

## 2020-04-04 ENCOUNTER — Other Ambulatory Visit: Payer: Self-pay | Admitting: Family

## 2020-04-04 ENCOUNTER — Other Ambulatory Visit (HOSPITAL_COMMUNITY): Payer: Self-pay | Admitting: Family

## 2020-04-04 ENCOUNTER — Other Ambulatory Visit: Payer: Self-pay | Admitting: Family Medicine

## 2020-04-04 DIAGNOSIS — Z8659 Personal history of other mental and behavioral disorders: Secondary | ICD-10-CM

## 2020-04-04 DIAGNOSIS — G4700 Insomnia, unspecified: Secondary | ICD-10-CM

## 2020-04-04 DIAGNOSIS — F411 Generalized anxiety disorder: Secondary | ICD-10-CM

## 2020-04-04 MED ORDER — SERTRALINE HCL 100 MG PO TABS: 100.0000 mg | ORAL_TABLET | Freq: Every day | ORAL | 3 refills | Status: DC

## 2020-04-04 MED FILL — SERTRALINE HCL 100 MG TABS: 100 | 90 days supply | Qty: 90 | Fill #0

## 2020-04-20 ENCOUNTER — Encounter: Payer: Self-pay | Admitting: Nurse Practitioner

## 2020-04-20 ENCOUNTER — Other Ambulatory Visit: Payer: Self-pay

## 2020-04-20 ENCOUNTER — Ambulatory Visit (INDEPENDENT_AMBULATORY_CARE_PROVIDER_SITE_OTHER): Payer: No Typology Code available for payment source | Admitting: Nurse Practitioner

## 2020-04-20 VITALS — BP 98/68 | HR 88 | Temp 97.5°F | Resp 20 | Ht 67.0 in | Wt 176.0 lb

## 2020-04-20 DIAGNOSIS — M26602 Left temporomandibular joint disorder, unspecified: Secondary | ICD-10-CM | POA: Diagnosis not present

## 2020-04-20 DIAGNOSIS — S0300XA Dislocation of jaw, unspecified side, initial encounter: Secondary | ICD-10-CM

## 2020-04-20 MED ORDER — KETOROLAC TROMETHAMINE 60 MG/2ML IM SOLN
60.0000 mg | Freq: Once | INTRAMUSCULAR | Status: AC
  Administered 2020-04-20: 60 mg via INTRAMUSCULAR

## 2020-04-20 MED ORDER — NAPROXEN 500 MG PO TABS: 500.0000 mg | ORAL_TABLET | Freq: Two times a day (BID) | ORAL | 1 refills | Status: DC

## 2020-04-20 NOTE — Patient Instructions (Signed)

## 2020-04-20 NOTE — Progress Notes (Signed)
   Subjective:    Patient ID: Katherine Hays, female    DOB: [birth year], 49 y.o.   MRN: [id number]   Chief Complaint: Left side of jaw hurting   HPI Patient comes in c/o left jaw pain. She has been diagnosed with TMJ years ago. She had a mouth guard that she use to wear at night, but she lost it several years ago. Her left jaw started hurting about 1 week ago. Rates pain 3-4/10. Being still make sit better. Talking and chewing makes it worse. She has not been taking anything for it.    Review of Systems  Constitutional: Negative for diaphoresis.  Eyes: Negative for pain.  Respiratory: Negative for shortness of breath.   Cardiovascular: Negative for chest pain, palpitations and leg swelling.  Gastrointestinal: Negative for abdominal pain.  Endocrine: Negative for polydipsia.  Skin: Negative for rash.  Neurological: Negative for dizziness, weakness and headaches.  Hematological: Does not bruise/bleed easily.  All other systems reviewed and are negative.      Objective:   Physical Exam Vitals and nursing note reviewed.  Constitutional:      Appearance: Normal appearance.  HENT:     Head:     Jaw: Malocclusion present.     Comments: Crepitus of left jaw with opening and closing. Cardiovascular:     Rate and Rhythm: Normal rate and regular rhythm.     Heart sounds: Normal heart sounds.  Pulmonary:     Breath sounds: Normal breath sounds.  Skin:    General: Skin is warm and dry.  Neurological:     General: No focal deficit present.     Mental Status: She is alert and oriented to person, place, and time.  Psychiatric:        Mood and Affect: Mood normal.        Behavior: Behavior normal.     BP 98/68   Pulse 88   Temp (!) 97.5 F (36.4 C) (Temporal)   Resp 20   Ht 5\' 7"  (1.702 m)   Wt 176 lb (79.8 kg)   BMI 27.57 kg/m         Assessment & Plan:  Katherine Hays in today with chief complaint of Left side of jaw hurting   1. Dislocation of  temporomandibular joint, initial encounter Moist heat Avoid foods that require a lot of chewing. Recheck prn - ketorolac (TORADOL) injection 60 mg - naproxen (NAPROSYN) 500 MG tablet; Take 1 tablet (500 mg total) by mouth 2 (two) times daily with a meal.  Dispense: 60 tablet; Refill: 1    The above assessment and management plan was discussed with the patient. The patient verbalized understanding of and has agreed to the management plan. Patient is aware to call the clinic if symptoms persist or worsen. Patient is aware when to return to the clinic for a follow-up visit. Patient educated on when it is appropriate to go to the emergency department.   Mary-Margaret Hassell Done, FNP

## 2020-05-17 MED FILL — ALPRAZolam 0.5 MG TABS: 0.5 | 30 days supply | Qty: 60 | Fill #0

## 2020-06-01 ENCOUNTER — Other Ambulatory Visit: Payer: Self-pay | Admitting: Family

## 2020-06-01 ENCOUNTER — Other Ambulatory Visit (HOSPITAL_COMMUNITY): Payer: Self-pay | Admitting: Family

## 2020-06-01 DIAGNOSIS — E039 Hypothyroidism, unspecified: Secondary | ICD-10-CM

## 2020-06-01 MED FILL — FLUTICASONE PROP 50 MCG SPR: 50 | 30 days supply | Qty: 16 | Fill #0

## 2020-06-01 MED FILL — LEVOTHYROXINE 50 MCG TABLET: 50 | 90 days supply | Qty: 90 | Fill #0

## 2020-07-07 MED FILL — ALPRAZolam 0.5 MG TABS: 0.5 | 30 days supply | Qty: 60 | Fill #1

## 2020-07-07 MED FILL — SERTRALINE HCL 100 MG TABS: 100 | 90 days supply | Qty: 90 | Fill #1

## 2020-08-02 ENCOUNTER — Ambulatory Visit (INDEPENDENT_AMBULATORY_CARE_PROVIDER_SITE_OTHER): Payer: No Typology Code available for payment source

## 2020-08-02 ENCOUNTER — Other Ambulatory Visit: Payer: Self-pay

## 2020-08-02 DIAGNOSIS — Z23 Encounter for immunization: Secondary | ICD-10-CM | POA: Diagnosis not present

## 2020-08-02 NOTE — Progress Notes (Signed)
° °  Covid-19 Vaccination Clinic  Name:  Katherine Hays    MRN: [id number] DOB: 1970-12-05  08/02/2020  Katherine Hays was observed post Covid-19 immunization for 15 minutes without incident. She was provided with Vaccine Information Sheet and instruction to access the V-Safe system.   Katherine Hays was instructed to call 911 with any severe reactions post vaccine:  Difficulty breathing   Swelling of face and throat   A fast heartbeat   A bad rash all over body   Dizziness and weakness   Immunizations Administered    No immunizations on file.

## 2020-09-18 ENCOUNTER — Other Ambulatory Visit: Payer: No Typology Code available for payment source

## 2020-09-18 ENCOUNTER — Encounter: Payer: Self-pay | Admitting: Nurse Practitioner

## 2020-09-18 ENCOUNTER — Ambulatory Visit (INDEPENDENT_AMBULATORY_CARE_PROVIDER_SITE_OTHER): Payer: No Typology Code available for payment source | Admitting: Nurse Practitioner

## 2020-09-18 VITALS — Temp 99.4°F

## 2020-09-18 DIAGNOSIS — R300 Dysuria: Secondary | ICD-10-CM | POA: Insufficient documentation

## 2020-09-18 DIAGNOSIS — Z1152 Encounter for screening for COVID-19: Secondary | ICD-10-CM

## 2020-09-18 LAB — URINALYSIS, MICROSCOPIC ONLY
Epithelial Cells (non renal): NONE SEEN /hpf (ref 0–10)
RBC, Urine: NONE SEEN /hpf (ref 0–2)

## 2020-09-18 MED ORDER — CEFTRIAXONE SODIUM 1 G IJ SOLR
1.0000 g | Freq: Once | INTRAMUSCULAR | Status: AC
  Administered 2020-09-18: 1 g via INTRAMUSCULAR

## 2020-09-18 MED ORDER — CEPHALEXIN 500 MG PO CAPS: 500.0000 mg | ORAL_CAPSULE | Freq: Two times a day (BID) | ORAL | 0 refills | Status: DC

## 2020-09-18 NOTE — Assessment & Plan Note (Signed)
Symptoms of dysuria in the last few days.  Left lower back pain.  frequency, fatigue, low-grade fever, CVA tenderness with a pain scale of 4-5 over 10. Urinalysis completed results pending.  Increase hydration and decrease caffeine. Rocephin shot given, Keflex ordered.  Rx sent to pharmacy.  Advised patient to follow-up with worsening or unresolved symptoms.

## 2020-09-18 NOTE — Progress Notes (Signed)
   Virtual Visit via telephone Note Due to COVID-19 pandemic this visit was conducted virtually. This visit type was conducted due to national recommendations for restrictions regarding the COVID-19 Pandemic (e.g. social distancing, sheltering in place) in an effort to limit this patient's exposure and mitigate transmission in our community. All issues noted in this document were discussed and addressed.  A physical exam was not performed with this format.  I connected with Katherine Hays on 09/18/20 at 11:38 am by telephone and verified that I am speaking with the correct person using two identifiers. Katherine Hays is currently located at work during visit. The provider, Ivy Lynn, NP is located in their office at time of visit.  I discussed the limitations, risks, security and privacy concerns of performing an evaluation and management service by telephone and the availability of in person appointments. I also discussed with the patient that there may be a patient responsible charge related to this service. The patient expressed understanding and agreed to proceed.   History and Present Illness:  Dysuria  This is a new problem. The current episode started in the past 7 days. The problem occurs every urination. The problem has been unchanged. The pain is at a severity of 5/10. The pain is moderate. Maximum temperature: Low-grade fever 99.4. Associated symptoms include flank pain and frequency. Pertinent negatives include no chills or discharge. She has tried nothing for the symptoms.      Review of Systems  Constitutional: Positive for fever. Negative for chills.  HENT: Negative.   Cardiovascular: Negative.   Genitourinary: Positive for dysuria, flank pain and frequency.  Skin: Negative.   All other systems reviewed and are negative.    Observations/Objective: Televisit patient does not sound to be in distress. Assessment and Plan: Dysuria Symptoms of dysuria in the  last few days.  Left lower back pain.  frequency, fatigue, low-grade fever, CVA tenderness with a pain scale of 4-5 over 10. Urinalysis completed results pending.  Increase hydration and decrease caffeine. Rocephin shot given, Keflex ordered.  Rx sent to pharmacy.  Advised patient to follow-up with worsening or unresolved symptoms.     Follow Up Instructions: Follow-up with worsening or unresolved symptoms.    I discussed the assessment and treatment plan with the patient. The patient was provided an opportunity to ask questions and all were answered. The patient agreed with the plan and demonstrated an understanding of the instructions.   The patient was advised to call back or seek an in-person evaluation if the symptoms worsen or if the condition fails to improve as anticipated.  The above assessment and management plan was discussed with the patient. The patient verbalized understanding of and has agreed to the management plan. Patient is aware to call the clinic if symptoms persist or worsen. Patient is aware when to return to the clinic for a follow-up visit. Patient educated on when it is appropriate to go to the emergency department.   Time call ended: 11:46 am  I provided 7 minutes of non-face-to-face time during this encounter.    Ivy Lynn, NP

## 2020-09-20 LAB — URINE CULTURE

## 2020-09-20 LAB — SARS-COV-2, NAA 2 DAY TAT

## 2020-09-20 LAB — NOVEL CORONAVIRUS, NAA: SARS-CoV-2, NAA: NOT DETECTED

## 2020-09-22 ENCOUNTER — Other Ambulatory Visit: Payer: No Typology Code available for payment source

## 2020-09-22 DIAGNOSIS — Z1152 Encounter for screening for COVID-19: Secondary | ICD-10-CM

## 2020-09-23 LAB — SARS-COV-2, NAA 2 DAY TAT

## 2020-09-23 LAB — NOVEL CORONAVIRUS, NAA: SARS-CoV-2, NAA: NOT DETECTED

## 2020-09-27 ENCOUNTER — Other Ambulatory Visit: Payer: Self-pay | Admitting: Family

## 2020-09-27 DIAGNOSIS — Z79899 Other long term (current) drug therapy: Secondary | ICD-10-CM

## 2020-09-27 DIAGNOSIS — F1320 Sedative, hypnotic or anxiolytic dependence, uncomplicated: Secondary | ICD-10-CM

## 2020-09-27 DIAGNOSIS — F411 Generalized anxiety disorder: Secondary | ICD-10-CM

## 2020-09-28 ENCOUNTER — Ambulatory Visit (INDEPENDENT_AMBULATORY_CARE_PROVIDER_SITE_OTHER): Payer: No Typology Code available for payment source | Admitting: Family Medicine

## 2020-09-28 ENCOUNTER — Ambulatory Visit: Payer: No Typology Code available for payment source | Admitting: Family Medicine

## 2020-09-28 ENCOUNTER — Encounter: Payer: Self-pay | Admitting: Family Medicine

## 2020-09-28 ENCOUNTER — Other Ambulatory Visit: Payer: Self-pay

## 2020-09-28 VITALS — BP 94/61 | HR 88 | Ht 67.0 in | Wt 176.0 lb

## 2020-09-28 DIAGNOSIS — M25512 Pain in left shoulder: Secondary | ICD-10-CM

## 2020-09-28 MED ORDER — METHYLPREDNISOLONE ACETATE 80 MG/ML IJ SUSP
80.0000 mg | Freq: Once | INTRAMUSCULAR | Status: AC
  Administered 2020-09-28: 80 mg via INTRAMUSCULAR

## 2020-09-28 NOTE — Progress Notes (Signed)
BP 94/61   Pulse 88   Ht 5\' 7"  (1.702 m)   Wt 176 lb (79.8 kg)   SpO2 99%   BMI 27.57 kg/m    Subjective:   Patient ID: Katherine Hays, female    DOB: [birth year], 50 y.o.   MRN: [id number]  HPI: Katherine Hays is a 50 y.o. female presenting on 09/28/2020 for No chief complaint on file.   HPI Left shoulder pain Patient had a fall 3 weeks ago when she fell forward after tripping over something and felt something pop in her shoulder and since then she has been having issues especially with reaching up overhead and reaching behind.  She thought it was getting better over the first week but then its been worsening since.  She has not been using any ice or ibuprofen but just trying to rest it but it does not seem to be getting any better and it does feel like it is getting worse.  She says reaching back and reaching up overhead is the worst range of motion.  She denies any numbness or weakness.  Relevant past medical, surgical, family and social history reviewed and updated as indicated. Interim medical history since our last visit reviewed. Allergies and medications reviewed and updated.  Review of Systems  Constitutional: Negative for chills and fever.  Eyes: Negative for visual disturbance.  Respiratory: Negative for chest tightness and shortness of breath.   Cardiovascular: Negative for chest pain and leg swelling.  Musculoskeletal: Positive for arthralgias and myalgias. Negative for back pain and gait problem.  Skin: Negative for rash.  Neurological: Negative for light-headedness and headaches.  Psychiatric/Behavioral: Negative for agitation and behavioral problems.  All other systems reviewed and are negative.   Per HPI unless specifically indicated above   Allergies as of 09/28/2020      Reactions   Codeine       Medication List       Accurate as of September 28, 2020 10:30 AM. If you have any questions, ask your nurse or doctor.        STOP taking these  medications   cephALEXin 500 MG capsule Commonly known as: Keflex Stopped by: Fransisca Kaufmann Isyss Espinal, MD   naproxen 500 MG tablet Commonly known as: Naprosyn Stopped by: Fransisca Kaufmann Denise Bramblett, MD     TAKE these medications   ALPRAZolam 0.5 MG tablet Commonly known as: XANAX TAKE  (1)  TABLET TWICE A DAY.   fluticasone 50 MCG/ACT nasal spray Commonly known as: FLONASE Place 2 sprays into both nostrils daily.   levonorgestrel 20 MCG/24HR IUD Commonly known as: MIRENA 1 each by Intrauterine route once.   levothyroxine 50 MCG tablet Commonly known as: SYNTHROID TAKE 1 TABLET BY MOUTH DAILY BEFORE BREAKFAST.   pantoprazole 40 MG tablet Commonly known as: PROTONIX Take 40 mg by mouth daily.   sertraline 100 MG tablet Commonly known as: ZOLOFT Take 1 tablet (100 mg total) by mouth daily.        Objective:   BP 94/61   Pulse 88   Ht 5\' 7"  (1.702 m)   Wt 176 lb (79.8 kg)   SpO2 99%   BMI 27.57 kg/m   Wt Readings from Last 3 Encounters:  09/28/20 176 lb (79.8 kg)  04/20/20 176 lb (79.8 kg)  03/14/20 176 lb (79.8 kg)    Physical Exam Vitals and nursing note reviewed.  Constitutional:      General: She is not in acute distress.  Appearance: She is well-developed and well-nourished. She is not diaphoretic.  Eyes:     Extraocular Movements: EOM normal.     Conjunctiva/sclera: Conjunctivae normal.  Cardiovascular:     Pulses: Intact distal pulses.  Musculoskeletal:        General: No edema. Normal range of motion.     Left shoulder: Tenderness (Pain with overhead range of motion and also positive pain with internal rotation and positive empty can and Hawkins impingement.) present. No crepitus. Normal range of motion. Normal strength. Normal pulse.  Skin:    General: Skin is warm and dry.     Findings: No rash.  Neurological:     Mental Status: She is alert and oriented to person, place, and time.     Coordination: Coordination normal.  Psychiatric:        Mood and  Affect: Mood and affect normal.        Behavior: Behavior normal.       Assessment & Plan:   Problem List Items Addressed This Visit   None   Visit Diagnoses    Pain of left shoulder with external rotation    -  Primary   Relevant Medications   methylPREDNISolone acetate (DEPO-MEDROL) injection 80 mg (Start on 09/28/2020 11:00 AM)      Left subacromial injection: Consent form signed. Risk factors of bleeding and infection discussed with patient and patient is agreeable towards injection. Patient prepped with Betadine.  Posterior approach towards injection used. Injected 80 mg of Depo-Medrol and 1 mL of 2% lidocaine. Patient tolerated procedure well and no side effects from noted. Minimal to no bleeding. Simple bandage applied after.  Follow up plan: Return if symptoms worsen or fail to improve.  Counseling provided for all of the vaccine components No orders of the defined types were placed in this encounter.   Caryl Pina, MD [facility] 09/28/2020, 10:30 AM

## 2020-10-03 ENCOUNTER — Other Ambulatory Visit (HOSPITAL_COMMUNITY): Payer: Self-pay | Admitting: Family

## 2020-10-03 ENCOUNTER — Other Ambulatory Visit: Payer: Self-pay | Admitting: Family

## 2020-10-03 DIAGNOSIS — F1320 Sedative, hypnotic or anxiolytic dependence, uncomplicated: Secondary | ICD-10-CM

## 2020-10-03 DIAGNOSIS — F411 Generalized anxiety disorder: Secondary | ICD-10-CM

## 2020-10-03 DIAGNOSIS — Z79899 Other long term (current) drug therapy: Secondary | ICD-10-CM

## 2020-10-03 MED ORDER — ALPRAZOLAM 0.5 MG PO TABS: ORAL_TABLET | ORAL | 5 refills | Status: DC

## 2020-10-03 MED FILL — ALPRAZolam 0.5 MG TABS: 0.5 | 30 days supply | Qty: 60 | Fill #0

## 2020-10-03 NOTE — Progress Notes (Signed)
Requesting refill of xanax. Prescription sent to pharmacy.

## 2020-11-03 ENCOUNTER — Ambulatory Visit (INDEPENDENT_AMBULATORY_CARE_PROVIDER_SITE_OTHER): Payer: No Typology Code available for payment source | Admitting: Family Medicine

## 2020-11-03 ENCOUNTER — Encounter: Payer: Self-pay | Admitting: Family Medicine

## 2020-11-03 DIAGNOSIS — J398 Other specified diseases of upper respiratory tract: Secondary | ICD-10-CM

## 2020-11-03 DIAGNOSIS — J069 Acute upper respiratory infection, unspecified: Secondary | ICD-10-CM | POA: Diagnosis not present

## 2020-11-03 LAB — VERITOR FLU A/B WAIVED
Influenza A: NEGATIVE
Influenza B: NEGATIVE

## 2020-11-03 NOTE — Addendum Note (Signed)
Addended by: Liliane Bade on: 11/03/2020 08:43 AM   Modules accepted: Orders

## 2020-11-03 NOTE — Addendum Note (Signed)
Addended by: Liliane Bade on: 11/03/2020 08:40 AM   Modules accepted: Orders

## 2020-11-03 NOTE — Progress Notes (Signed)
   Virtual Visit via Telephone Note  I connected with Katherine Hays on 11/03/20 at 8:08 AM by telephone and verified that I am speaking with the correct person using two identifiers. Katherine Hays is currently located at home and nobody is currently with her during this visit. The provider, Loman Brooklyn, FNP is located in their home at time of visit.  I discussed the limitations, risks, security and privacy concerns of performing an evaluation and management service by telephone and the availability of in person appointments. I also discussed with the patient that there may be a patient responsible charge related to this service. The patient expressed understanding and agreed to proceed.  Subjective: PCP: Sharion Balloon, FNP  Chief Complaint  Patient presents with  . URI   Patient complains of head congestion, headache and body aches. She feels it is moving into her chest. Onset of symptoms was last night. She is not drinking much. Evaluation to date: none. Treatment to date: Nyquil. She has a history of asthma. She does not smoke. Her son tested positive for flu last week.    ROS: Per HPI  Current Outpatient Medications:  .  ALPRAZolam (XANAX) 0.5 MG tablet, TAKE  (1)  TABLET TWICE A DAY., Disp: 60 tablet, Rfl: 5 .  fluticasone (FLONASE) 50 MCG/ACT nasal spray, Place 2 sprays into both nostrils daily., Disp: 16 g, Rfl: 6 .  levonorgestrel (MIRENA) 20 MCG/24HR IUD, 1 each by Intrauterine route once., Disp: , Rfl:  .  levothyroxine (SYNTHROID) 50 MCG tablet, TAKE 1 TABLET BY MOUTH DAILY BEFORE BREAKFAST., Disp: 90 tablet, Rfl: 3 .  pantoprazole (PROTONIX) 40 MG tablet, Take 40 mg by mouth daily., Disp: , Rfl:  .  sertraline (ZOLOFT) 100 MG tablet, Take 1 tablet (100 mg total) by mouth daily., Disp: 90 tablet, Rfl: 3  Allergies  Allergen Reactions  . Codeine    Past Medical History:  Diagnosis Date  . Anxiety   . Asthma   . OCD (obsessive compulsive disorder)      Observations/Objective: A&O  No respiratory distress or wheezing audible over the phone Mood, judgement, and thought processes all WNL  Assessment and Plan: 1. Viral URI Discussed symptom management. Will send Tamiflu to [facility] in [place] if influenza is positive.  2. Congestion of upper respiratory tract - Veritor Flu A/B Waived; Future - Novel Coronavirus, NAA ([facility]); Future   Follow Up Instructions:  I discussed the assessment and treatment plan with the patient. The patient was provided an opportunity to ask questions and all were answered. The patient agreed with the plan and demonstrated an understanding of the instructions.   The patient was advised to call back or seek an in-person evaluation if the symptoms worsen or if the condition fails to improve as anticipated.  The above assessment and management plan was discussed with the patient. The patient verbalized understanding of and has agreed to the management plan. Patient is aware to call the clinic if symptoms persist or worsen. Patient is aware when to return to the clinic for a follow-up visit. Patient educated on when it is appropriate to go to the emergency department.   Time call ended: 8:16 AM  I provided 8 minutes of non-face-to-face time during this encounter.  Hendricks Limes, MSN, APRN, FNP-C [facility] 11/03/20

## 2020-11-04 LAB — NOVEL CORONAVIRUS, NAA: SARS-CoV-2, NAA: NOT DETECTED

## 2020-11-04 LAB — SARS-COV-2, NAA 2 DAY TAT

## 2020-11-06 MED FILL — LEVOTHYROXINE 50 MCG TABLET: 50 | 90 days supply | Qty: 90 | Fill #1

## 2020-11-06 MED FILL — SERTRALINE HCL 100 MG TABS: 100 | 90 days supply | Qty: 90 | Fill #2

## 2020-11-07 ENCOUNTER — Other Ambulatory Visit (HOSPITAL_BASED_OUTPATIENT_CLINIC_OR_DEPARTMENT_OTHER): Payer: Self-pay

## 2020-11-07 LAB — HM PAP SMEAR: HPV, high-risk: NEGATIVE

## 2020-11-14 ENCOUNTER — Encounter: Payer: Self-pay | Admitting: Nurse Practitioner

## 2020-11-14 ENCOUNTER — Other Ambulatory Visit: Payer: Self-pay

## 2020-11-14 ENCOUNTER — Ambulatory Visit (INDEPENDENT_AMBULATORY_CARE_PROVIDER_SITE_OTHER): Payer: No Typology Code available for payment source | Admitting: Nurse Practitioner

## 2020-11-14 VITALS — BP 97/67 | HR 70 | Temp 97.4°F | Ht 67.0 in | Wt 177.0 lb

## 2020-11-14 DIAGNOSIS — D649 Anemia, unspecified: Secondary | ICD-10-CM

## 2020-11-14 NOTE — Progress Notes (Signed)
Acute Office Visit  Subjective:    Patient ID: Katherine Hays, female    DOB: 02/17/1971, 50 y.o.   MRN: [id number]  Chief Complaint  Patient presents with  . low hemoglobin    Anemia Presents for initial visit. There has been no abdominal pain, confusion, fever, leg swelling, light-headedness, malaise/fatigue, palpitations, pica or weight loss. Past treatments include nothing.  Patient went to donate blood at [facility] and hemoglobin was checked and found that it was 10.7gm/dl.  Patient was asked to follow-up with PCP to reassess low hemoglobin.    Past Medical History:  Diagnosis Date  . Anxiety   . Asthma   . OCD (obsessive compulsive disorder)     Past Surgical History:  Procedure Laterality Date  . TONSILLECTOMY    . TUBAL LIGATION    . WISDOM TOOTH EXTRACTION      Family History  Problem Relation Age of Onset  . Liver disease Father        cirrhosis  . Pancreatic cancer Maternal Uncle   . Diabetes Maternal Grandfather   . Stroke Maternal Grandfather   . Breast cancer Paternal Grandmother   . Cancer Paternal Grandmother        breast  . Heart disease Paternal Grandfather   . Stroke Paternal Grandfather   . Hyperthyroidism Mother   . Hypertension Brother   . Crohn's disease Other        Nephew    Social History   Socioeconomic History  . Marital status: Married    Spouse name: Not on file  . Number of children: 3  . Years of education: Not on file  . Highest education level: Not on file  Occupational History  . Occupation: Licensed conveyancer: [employer]  Tobacco Use  . Smoking status: Never Smoker  . Smokeless tobacco: Never Used  Vaping Use  . Vaping Use: Never used  Substance and Sexual Activity  . Alcohol use: No  . Drug use: No  . Sexual activity: Yes  Other Topics Concern  . Not on file  Social History Narrative  . Not on file   Social Determinants of Health   Financial Resource Strain: Not on file  Food Insecurity: Not  on file  Transportation Needs: Not on file  Physical Activity: Not on file  Stress: Not on file  Social Connections: Not on file  Intimate Partner Violence: Not on file    Outpatient Medications Prior to Visit  Medication Sig Dispense Refill  . ALPRAZolam (XANAX) 0.5 MG tablet TAKE  (1)  TABLET TWICE A DAY. 60 tablet 5  . fluticasone (FLONASE) 50 MCG/ACT nasal spray Place 2 sprays into both nostrils daily. 16 g 6  . levonorgestrel (MIRENA) 20 MCG/24HR IUD 1 each by Intrauterine route once.    Marland Kitchen levothyroxine (SYNTHROID) 50 MCG tablet TAKE 1 TABLET BY MOUTH DAILY BEFORE BREAKFAST. 90 tablet 3  . pantoprazole (PROTONIX) 40 MG tablet Take 40 mg by mouth daily.    . sertraline (ZOLOFT) 100 MG tablet Take 1 tablet (100 mg total) by mouth daily. 90 tablet 3   No facility-administered medications prior to visit.    Allergies  Allergen Reactions  . Codeine     Review of Systems  Constitutional: Negative for fever, malaise/fatigue and weight loss.  Respiratory: Negative for shortness of breath.   Cardiovascular: Negative for palpitations.  Gastrointestinal: Negative for abdominal pain.  Skin: Negative for color change.  Neurological: Negative for light-headedness.  Psychiatric/Behavioral: Negative for confusion.  All other systems reviewed and are negative.      Objective:    Physical Exam Vitals reviewed.  HENT:     Head: Normocephalic.     Nose: Nose normal.  Eyes:     Conjunctiva/sclera: Conjunctivae normal.  Cardiovascular:     Rate and Rhythm: Normal rate and regular rhythm.     Pulses: Normal pulses.     Heart sounds: Normal heart sounds.  Pulmonary:     Effort: Pulmonary effort is normal.     Breath sounds: Normal breath sounds.  Abdominal:     General: Bowel sounds are normal.  Musculoskeletal:        General: Normal range of motion.  Neurological:     Mental Status: She is alert and oriented to person, place, and time.  Psychiatric:        Behavior:  Behavior normal.     BP 97/67   Pulse 70   Temp (!) 97.4 F (36.3 C) (Temporal)   Ht 5\' 7"  (1.702 m)   Wt 177 lb (80.3 kg)   SpO2 99%   BMI 27.72 kg/m  Wt Readings from Last 3 Encounters:  11/14/20 177 lb (80.3 kg)  09/28/20 176 lb (79.8 kg)  04/20/20 176 lb (79.8 kg)    Health Maintenance Due  Topic Date Due  . PAP SMEAR-Modifier  02/15/2018    There are no preventive care reminders to display for this patient.   Lab Results  Component Value Date   TSH 2.390 03/15/2020   Lab Results  Component Value Date   WBC 8.5 11/14/2020   HGB 12.7 11/14/2020   HCT 39.6 11/14/2020   MCV 88 11/14/2020   PLT 368 11/14/2020   Lab Results  Component Value Date   NA 138 03/15/2020   K 4.7 03/15/2020   CO2 22 03/15/2020   GLUCOSE 100 (H) 03/15/2020   BUN 10 03/15/2020   CREATININE 0.72 03/15/2020   BILITOT 0.3 03/15/2020   ALKPHOS 82 03/15/2020   AST 18 03/15/2020   ALT 16 03/15/2020   PROT 6.9 03/15/2020   ALBUMIN 4.6 03/15/2020   CALCIUM 9.1 03/15/2020   ANIONGAP 7 12/24/2014   GFR 115.64 10/27/2012   Lab Results  Component Value Date   CHOL 185 03/15/2020   Lab Results  Component Value Date   HDL 42 03/15/2020   Lab Results  Component Value Date   LDLCALC 124 (H) 03/15/2020   Lab Results  Component Value Date   TRIG 103 03/15/2020   Lab Results  Component Value Date   CHOLHDL 4.4 03/15/2020   No results found for: HGBA1C     Assessment & Plan:   Problem List Items Addressed This Visit      Other   Low hemoglobin - Primary    No current signs and symptoms of low hemoglobin.  [facility] when CBC returned with 10.7 GM/DL.  Patient was asked to reassess with PCP. Completed anemia profile and B12 results pending.       Relevant Orders   Anemia Profile B (Completed)   Vitamin B12       No orders of the defined types were placed in this encounter.    Ivy Lynn, NP

## 2020-11-14 NOTE — Patient Instructions (Addendum)
Anemia profile completed with B12, results pending.  Encourage patient to increase green leafy vegetable to diet and hydration.  [employer] medicine (25th ed., pp. [phone]). [place], [place]: Elsevier.">  Anemia  Anemia is a condition in which there is not enough red blood cells or hemoglobin in the blood. Hemoglobin is a substance in red blood cells that carries oxygen. When you do not have enough red blood cells or hemoglobin (are anemic), your body cannot get enough oxygen and your organs may not work properly. As a result, you may feel very tired or have other problems. What are the causes? Common causes of anemia include:  Excessive bleeding. Anemia can be caused by excessive bleeding inside or outside the body, including bleeding from the intestines or from heavy menstrual periods in females.  Poor nutrition.  Long-lasting (chronic) kidney, thyroid, and liver disease.  Bone marrow disorders, spleen problems, and blood disorders.  Cancer and treatments for cancer.  HIV (human immunodeficiency virus) and AIDS (acquired immunodeficiency syndrome).  Infections, medicines, and autoimmune disorders that destroy red blood cells. What are the signs or symptoms? Symptoms of this condition include:  Minor weakness.  Dizziness.  Headache, or difficulties concentrating and sleeping.  Heartbeats that feel irregular or faster than normal (palpitations).  Shortness of breath, especially with exercise.  Pale skin, lips, and nails, or cold hands and feet.  Indigestion and nausea. Symptoms may occur suddenly or develop slowly. If your anemia is mild, you may not have symptoms. How is this diagnosed? This condition is diagnosed based on blood tests, your medical history, and a physical exam. In some cases, a test may be needed in which cells are removed from the soft tissue inside of a bone and looked at under a microscope (bone marrow biopsy). Your health care provider may also  check your stool (feces) for blood and may do additional testing to look for the cause of your bleeding. Other tests may include:  Imaging tests, such as a CT scan or MRI.  A procedure to see inside your esophagus and stomach (endoscopy).  A procedure to see inside your colon and rectum (colonoscopy). How is this treated? Treatment for this condition depends on the cause. If you continue to lose a lot of blood, you may need to be treated at a hospital. Treatment may include:  Taking supplements of iron, vitamin X45, or folic acid.  Taking a hormone medicine (erythropoietin) that can help to stimulate red blood cell growth.  Having a blood transfusion. This may be needed if you lose a lot of blood.  Making changes to your diet.  Having surgery to remove your spleen. Follow these instructions at home:  Take over-the-counter and prescription medicines only as told by your health care provider.  Take supplements only as told by your health care provider.  Follow any diet instructions that you were given by your health care provider.  Keep all follow-up visits as told by your health care provider. This is important. Contact a health care provider if:  You develop new bleeding anywhere in the body. Get help right away if:  You are very weak.  You are short of breath.  You have pain in your abdomen or chest.  You are dizzy or feel faint.  You have trouble concentrating.  You have bloody stools, black stools, or tarry stools.  You vomit repeatedly or you vomit up blood. These symptoms may represent a serious problem that is an emergency. Do not wait to see if the  symptoms will go away. Get medical help right away. Call your local emergency services (911 in the [place] Do not drive yourself to the hospital. Summary  Anemia is a condition in which you do not have enough red blood cells or enough of a substance in your red blood cells that carries oxygen  (hemoglobin).  Symptoms may occur suddenly or develop slowly.  If your anemia is mild, you may not have symptoms.  This condition is diagnosed with blood tests, a medical history, and a physical exam. Other tests may be needed.  Treatment for this condition depends on the cause of the anemia. This information is not intended to replace advice given to you by your health care provider. Make sure you discuss any questions you have with your health care provider. Document Revised: 07/13/2019 Document Reviewed: 07/13/2019 Elsevier Patient Education  2021 [employer].

## 2020-11-15 DIAGNOSIS — D649 Anemia, unspecified: Secondary | ICD-10-CM | POA: Insufficient documentation

## 2020-11-15 LAB — ANEMIA PROFILE B
Basophils Absolute: 0.1 10*3/uL (ref 0.0–0.2)
Basos: 1 %
EOS (ABSOLUTE): 0.2 10*3/uL (ref 0.0–0.4)
Eos: 3 %
Ferritin: 20 ng/mL (ref 15–150)
Folate: 7.2 ng/mL (ref >3.0)
Hematocrit: 39.6 % (ref 34.0–46.6)
Hemoglobin: 12.7 g/dL (ref 11.1–15.9)
Immature Grans (Abs): 0 10*3/uL (ref 0.0–0.1)
Immature Granulocytes: 0 %
Iron Saturation: 18 % (ref 15–55)
Iron: 59 ug/dL (ref 27–159)
Lymphocytes Absolute: 2.2 10*3/uL (ref 0.7–3.1)
Lymphs: 25 %
MCH: 28.2 pg (ref 26.6–33.0)
MCHC: 32.1 g/dL (ref 31.5–35.7)
MCV: 88 fL (ref 79–97)
Monocytes Absolute: 0.8 10*3/uL (ref 0.1–0.9)
Monocytes: 9 %
Neutrophils Absolute: 5.3 10*3/uL (ref 1.4–7.0)
Neutrophils: 62 %
Platelets: 368 10*3/uL (ref 150–450)
RBC: 4.51 x10E6/uL (ref 3.77–5.28)
RDW: 13.8 % (ref 11.7–15.4)
Retic Ct Pct: 1 % (ref 0.6–2.6)
Total Iron Binding Capacity: 335 ug/dL (ref 250–450)
UIBC: 276 ug/dL (ref 131–425)
Vitamin B-12: 574 pg/mL (ref 232–1245)
WBC: 8.5 10*3/uL (ref 3.4–10.8)

## 2020-11-15 NOTE — Assessment & Plan Note (Signed)
No current signs and symptoms of low hemoglobin.  [facility] was [facility]ating [facility] when CBC returned with 10.7 GM/DL.  Patient was asked to reassess with PCP. Completed anemia profile and B12 results pending.

## 2020-11-19 MED FILL — Alprazolam Tab 0.5 MG: ORAL | 30 days supply | Qty: 60 | Fill #0 | Status: AC

## 2020-11-20 ENCOUNTER — Other Ambulatory Visit (HOSPITAL_COMMUNITY): Payer: Self-pay

## 2020-11-21 ENCOUNTER — Ambulatory Visit (INDEPENDENT_AMBULATORY_CARE_PROVIDER_SITE_OTHER): Payer: No Typology Code available for payment source | Admitting: Family

## 2020-11-21 ENCOUNTER — Encounter: Payer: Self-pay | Admitting: Family

## 2020-11-21 VITALS — BP 105/69 | HR 74 | Temp 97.7°F | Ht 67.0 in | Wt 176.0 lb

## 2020-11-21 DIAGNOSIS — S060X0A Concussion without loss of consciousness, initial encounter: Secondary | ICD-10-CM

## 2020-11-21 DIAGNOSIS — W101XXA Fall (on)(from) sidewalk curb, initial encounter: Secondary | ICD-10-CM | POA: Diagnosis not present

## 2020-11-21 DIAGNOSIS — R519 Headache, unspecified: Secondary | ICD-10-CM | POA: Diagnosis not present

## 2020-11-21 DIAGNOSIS — S0990XA Unspecified injury of head, initial encounter: Secondary | ICD-10-CM | POA: Diagnosis not present

## 2020-11-21 DIAGNOSIS — R4189 Other symptoms and signs involving cognitive functions and awareness: Secondary | ICD-10-CM

## 2020-11-21 DIAGNOSIS — R110 Nausea: Secondary | ICD-10-CM

## 2020-11-21 MED ORDER — ONDANSETRON HCL 4 MG PO TABS: 4.0000 mg | ORAL_TABLET | Freq: Three times a day (TID) | ORAL | 0 refills | Status: DC | PRN

## 2020-11-21 NOTE — Patient Instructions (Signed)
Post-Concussion Syndrome  A concussion is a brain injury from a direct hit to the head or body. This hit causes the brain to shake quickly back and forth inside the skull. This can damage brain cells and cause chemical changes in the brain. Concussions are usually not life-threatening but can cause serious symptoms. Post-concussion syndrome is when symptoms that occur after a concussion last longer than normal. These symptoms can last from weeks to months. What are the causes? The cause of this condition is not known. It can happen whether your head injury was mild or severe. What increases the risk? You are more likely to develop this condition if:  You are female.  You are a child, teen, or young adult.  You have had a past head injury.  You have a history of headaches.  You have depression or anxiety.  You have loss of consciousness or cannot remember the event (have amnesia of the event).  You have multiple symptoms or severe symptoms at the time of your concussion. What are the signs or symptoms? Symptoms of this condition include:  Physical symptoms. You may have: ? Headaches. ? Tiredness. ? Dizziness and weakness. ? Blurry vision and sensitivity to light. ? Hearing difficulties. ? Problems with balance.  Mental and emotional symptoms. You may have: ? Memory problems and trouble concentrating. ? Difficulty sleeping or staying asleep. ? Feelings of irritability. ? Anxiety or depression. ? Difficulty learning new things. How is this diagnosed? This condition may be diagnosed based on:  Your symptoms.  A description of your injury.  Your medical history.  Testing your strength, balance, and nerve function (neurological examination). Your health care provider may order other tests, including brain imaging such as a CT scan or an MRI, and memory testing (neuropsychological testing). How is this treated? Treatment for this condition may depend on your symptoms.  Symptoms usually go away on their own over time. Treatments may include:  Medicines for headaches, anxiety, depression, and trouble sleeping (insomnia).  Resting your brain and body for a few days after your injury.  Rehabilitation therapy, such as: ? Physical or occupational therapy. This may include exercises to help with balance and dizziness. ? Mental health counseling. A form of talk therapy called cognitive behavioral therapy (CBT) can be especially helpful. This therapy helps you set goals and follow up on the changes that you make. ? Speech therapy. ? Vision therapy. A brain and eye specialist can recommend treatments for vision problems. Follow these instructions at home: Medicines  Take over-the-counter and prescription medicines only as told by your health care provider.  Avoid opioid prescription pain medicines when recovering from a concussion. Activity  Limit your mental activities for the first few days after your injury. This may include not doing the following: ? Homework or job-related work. ? Complex thinking. ? Watching TV, and using a computer or phone. ? Playing memory games and puzzles.  Gradually return to your normal activity level. If a certain activity brings on your symptoms, stop or slow down until you can do the activity without it triggering your symptoms.  Limit physical activity, such as exercise or sports, for the first few days after a concussion. Gradually return to normal activity as told by your health care provider.  Rest. Rest helps your brain heal. Make sure you: ? Get plenty of sleep at night. Most adults should get at least 7-9 hours of sleep each night. ? Rest during the day. Take naps or rest breaks when  you feel tired.  Do not do high-risk activities that could cause a second concussion, such as riding a bike or playing sports. Having another concussion before the first one has healed can be dangerous. General instructions  Do not  drink alcohol until your health care provider says that you can.  Keep track of the frequency and the severity of your symptoms. Give this information to your health care provider.  Keep all follow-up visits as directed by your health care provider. This is important. This includes visits with specialists.   Contact a health care provider if:  Your symptoms do not improve.  You have another injury. Get help right away if you:  Have a severe or worsening headache.  Are confused.  Have trouble staying awake.  Faint.  Vomit.  Have weakness or numbness in any part of your body.  Have a seizure.  Have trouble speaking. Summary  Post-concussion syndrome is when symptoms that occur after a concussion last longer than normal.  Symptoms usually go away on their own over time. Depending on your symptoms, you may need treatment, such as medicines or rehabilitation therapy.  Rest your brain and body for a few days after your injury. Gradually return to normal activities as told by your health care provider.  Get plenty of sleep, and avoid alcohol and opioid pain medicines while recovering from a concussion. This information is not intended to replace advice given to you by your health care provider. Make sure you discuss any questions you have with your health care provider. Document Revised: 07/28/2019 Document Reviewed: 07/28/2019 Elsevier Patient Education  [place].

## 2020-11-21 NOTE — Progress Notes (Signed)
Subjective:    Patient ID: Katherine Hays, female    DOB: 06-Aug-1971, 50 y.o.   MRN: [id number]  Chief Complaint  Patient presents with  . Fall    Hit head    HPI PT presents to the office today after she tripped and hit her head on the side of sidewalk four hours ago. She did not lose consciousness.  She reports she is having a headache, brain fog, and nausea. She reports her headache is a 10 out 10. She reports she is having sensitivity to light and feels like a migraine.    Denies any head injury in the past.  Denies dizziness, gait changes, speech, or memory.    Review of Systems  All other systems reviewed and are negative.      Objective:   Physical Exam Vitals reviewed.  Constitutional:      General: She is not in acute distress.    Appearance: She is well-developed.  HENT:     Head: Normocephalic and atraumatic.     Right Ear: Tympanic membrane normal.     Left Ear: Tympanic membrane normal.  Eyes:     Pupils: Pupils are equal, round, and reactive to light.  Neck:     Thyroid: No thyromegaly.  Cardiovascular:     Rate and Rhythm: Normal rate and regular rhythm.     Heart sounds: Normal heart sounds. No murmur heard.   Pulmonary:     Effort: Pulmonary effort is normal. No respiratory distress.     Breath sounds: Normal breath sounds. No wheezing.  Abdominal:     General: Bowel sounds are normal. There is no distension.     Palpations: Abdomen is soft.     Tenderness: There is no abdominal tenderness.  Musculoskeletal:        General: No tenderness. Normal range of motion.     Cervical back: Normal range of motion and neck supple.  Skin:    General: Skin is warm and dry.     Findings: Bruising present.       Neurological:     Mental Status: She is alert and oriented to person, place, and time.     Cranial Nerves: No cranial nerve deficit.     Deep Tendon Reflexes: Reflexes are normal and symmetric.  Psychiatric:        Behavior: Behavior  normal.        Thought Content: Thought content normal.        Judgment: Judgment normal.     BP 105/69   Pulse 74   Temp 97.7 F (36.5 C) (Temporal)   Ht 5\' 7"  (1.702 m)   Wt 176 lb (79.8 kg)   BMI 27.57 kg/m        Assessment & Plan:  Katherine Hays comes in today with chief complaint of Fall (Hit head)   Diagnosis and orders addressed:  1. Injury of head, initial encounter - CT Head Wo Contrast; Future  2. Fall involving sidewalk curb, initial encounter - CT Head Wo Contrast; Future  3. Nonintractable headache, unspecified chronicity pattern, unspecified headache type - CT Head Wo Contrast; Future  4. Brain fog - CT Head Wo Contrast; Future  5. Nausea - CT Head Wo Contrast; Future - ondansetron (ZOFRAN) 4 MG tablet; Take 1 tablet (4 mg total) by mouth every 8 (eight) hours as needed for nausea or vomiting.  Dispense: 20 tablet; Refill: 0  6. Concussion without loss of consciousness, initial encounter  CT  scan order Rest Rest brain, limit phone, tv, time.  Red flags discussed to go to ED  Katherine Dun, FNP

## 2020-11-22 ENCOUNTER — Ambulatory Visit (HOSPITAL_COMMUNITY)
Admission: RE | Admit: 2020-11-22 | Discharge: 2020-11-22 | Disposition: A | Discharge status: Home / Self Care | Payer: No Typology Code available for payment source | Source: Ambulatory Visit | Attending: Family | Admitting: Family

## 2020-11-22 DIAGNOSIS — S0990XA Unspecified injury of head, initial encounter: Secondary | ICD-10-CM | POA: Diagnosis not present

## 2020-11-22 DIAGNOSIS — R519 Headache, unspecified: Secondary | ICD-10-CM | POA: Insufficient documentation

## 2020-11-22 DIAGNOSIS — Y929 Unspecified place or not applicable: Secondary | ICD-10-CM | POA: Diagnosis not present

## 2020-11-22 DIAGNOSIS — W101XXA Fall (on)(from) sidewalk curb, initial encounter: Secondary | ICD-10-CM | POA: Diagnosis not present

## 2020-11-22 DIAGNOSIS — R4189 Other symptoms and signs involving cognitive functions and awareness: Secondary | ICD-10-CM | POA: Insufficient documentation

## 2020-11-22 DIAGNOSIS — Y939 Activity, unspecified: Secondary | ICD-10-CM | POA: Diagnosis not present

## 2020-11-22 DIAGNOSIS — R110 Nausea: Secondary | ICD-10-CM | POA: Insufficient documentation

## 2020-11-23 ENCOUNTER — Other Ambulatory Visit: Payer: Self-pay | Admitting: Family Medicine

## 2020-11-23 ENCOUNTER — Other Ambulatory Visit (HOSPITAL_COMMUNITY): Payer: Self-pay

## 2020-11-23 MED ORDER — FLUCONAZOLE 150 MG PO TABS: 150.0000 mg | ORAL_TABLET | Freq: Once | ORAL | 0 refills | Status: AC

## 2020-12-06 ENCOUNTER — Other Ambulatory Visit (HOSPITAL_COMMUNITY): Payer: Self-pay

## 2020-12-13 ENCOUNTER — Telehealth: Payer: Self-pay

## 2020-12-13 DIAGNOSIS — M25512 Pain in left shoulder: Secondary | ICD-10-CM

## 2020-12-13 NOTE — Telephone Encounter (Signed)
Patient saw Dr. Warrick Parisian back in February for left shoulder pain she is wanting referral now sent to [facility]

## 2020-12-13 NOTE — Telephone Encounter (Signed)
Pt needs referral for her shoulder.

## 2020-12-14 NOTE — Telephone Encounter (Signed)
Referral Prescription sent to pharmacy

## 2020-12-15 ENCOUNTER — Other Ambulatory Visit: Payer: Self-pay | Admitting: Family

## 2020-12-28 ENCOUNTER — Other Ambulatory Visit (HOSPITAL_COMMUNITY): Payer: Self-pay

## 2021-01-04 ENCOUNTER — Ambulatory Visit: Payer: No Typology Code available for payment source | Attending: Orthopedic Surgery | Admitting: Physical Therapy

## 2021-01-04 ENCOUNTER — Encounter: Payer: Self-pay | Admitting: Physical Therapy

## 2021-01-04 ENCOUNTER — Other Ambulatory Visit: Payer: Self-pay

## 2021-01-04 DIAGNOSIS — M25512 Pain in left shoulder: Secondary | ICD-10-CM | POA: Insufficient documentation

## 2021-01-04 NOTE — Therapy (Signed)
[facility] [place], [place], [address] Phone: [phone]   Fax:  [phone]  Physical Therapy Evaluation  Patient Details  Name: Katherine Hays MRN: [id number] Date of Birth: [birth year] Referring Provider (PT): Esmond Plants MD   Encounter Date: 01/04/2021   PT End of Session - 01/04/21 1022    Visit Number 1    Number of Visits 4    Date for PT Re-Evaluation 02/01/21    PT Start Time 0946    PT Stop Time 1020    PT Time Calculation (min) 34 min    Activity Tolerance Patient tolerated treatment well    Behavior During Therapy [facility] for tasks assessed/performed           Past Medical History:  Diagnosis Date  . Anxiety   . Asthma   . OCD (obsessive compulsive disorder)     Past Surgical History:  Procedure Laterality Date  . TONSILLECTOMY    . TUBAL LIGATION    . WISDOM TOOTH EXTRACTION      There were no vitals filed for this visit.    Subjective Assessment - 01/04/21 1024    Subjective COVID-19 screen performed prior to patient entering clinic. The patient presents to the clinic today with c/o left shoulder pain as the result of a couple of falls.  She had a recent injection which was very helpful.  Her pain is rated at a 2/10 today.  She reports prior to the injection she had difficulty raising her left arm.  Now she has full range of motion.    Pertinent History Hypothyroidism.    Patient Stated Goals Use left UE without pain.    Currently in Pain? Yes    Pain Score 2     Pain Location Shoulder    Pain Orientation Left    Pain Descriptors / Indicators Dull    Pain Type Acute pain    Pain Onset More than a month ago    Pain Frequency Intermittent    Aggravating Factors  End range movement.    Pain Relieving Factors Rest/Injection.              [facility] PT Assessment - 01/04/21 0001      Assessment   Medical Diagnosis Adhesice capsulitis of left shoulder.    Referring Provider (PT) Esmond Plants MD      Precautions   Precautions None      Restrictions   Weight Bearing Restrictions No      Balance Screen   Has the patient fallen in the past 6 months Yes    How many times? 3.    Has the patient had a decrease in activity level because of a fear of falling?  No    Is the patient reluctant to leave their home because of a fear of falling?  No      Home Ecologist residence      Prior Function   Level of Independence Independent      ROM / Strength   AROM / PROM / Strength AROM;Strength      AROM   Overall AROM Comments Full active left shoulder ROM.      Strength   Overall Strength Comments Nomral left shoulder strength.      Palpation   Palpation comment Tender to palpation primarily over left ACJ.      Special Tests   Other special tests Pain reproduction with a left shoulder  Impingement test.      Ambulation/Gait   Gait Comments WNL.                      Objective measurements completed on examination: See above findings.               PT Education - 01/04/21 1023    Education Details RW4.  Provided patient with yellow and red theraband.    Person(s) Educated Patient    Methods Explanation;Demonstration    Comprehension Verbalized understanding;Returned demonstration               PT Long Term Goals - 01/04/21 1128      PT LONG TERM GOAL #1   Title Independent with an HEP.    Time 4    Period Weeks    Status New      PT LONG TERM GOAL #2   Title Perform ADL's with no left shoulder pain.    Period Weeks    Status New                  Plan - 01/04/21 1122    Clinical Impression Statement The patient presents to OPPT with c/o left shoulder pain and adhesive capsulitis after a couple falls over the last few weeks.  A recent injection was very helpful.  She demonstrated full active left shoulder range of motion and normal strength.  She has some pain reproduction with an  Impingement test. She has some remaining tenderness over her left ACJ.  A HEP was established for the patient.  She will call the clinic should she need a follow-up.  Patient will benefit from skilled physical therapy intervention to address pain and deficits.    Examination-Activity Limitations Reach Overhead    Stability/Clinical Decision Making Stable/Uncomplicated    Clinical Decision Making Low    Rehab Potential Excellent    PT Frequency 1x / week    PT Duration 4 weeks    PT Treatment/Interventions ADLs/Self Care Home Management;Cryotherapy;Electrical Stimulation;Moist Heat;Iontophoresis 4mg /ml Dexamethasone;Functional mobility training;Therapeutic activities;Therapeutic exercise;Manual techniques;Patient/family education;Vasopneumatic Device    PT Next Visit Plan Left shoulder RW4, left detoid and scapular strengthening.  Modalities as needed.    Consulted and Agree with Plan of Care Patient           Patient will benefit from skilled therapeutic intervention in order to improve the following deficits and impairments:  Pain,Decreased activity tolerance  Visit Diagnosis: Acute pain of left shoulder - Plan: PT plan of care cert/re-cert     Problem List Patient Active Problem List   Diagnosis Date Noted  . Low hemoglobin 11/15/2020  . Dysuria 09/18/2020  . Controlled substance agreement signed 02/04/2019  . Benzodiazepine dependence ([place]) 02/04/2019  . Gastroesophageal reflux disease 02/04/2019  . Vitamin D deficiency 11/13/2017  . Hypothyroidism 11/13/2017  . Hyperlipemia 11/13/2017  . Insomnia 11/10/2017  . History of OCD (obsessive compulsive disorder) 01/12/2014  . GAD (generalized anxiety disorder) 01/12/2014  . Inguinal lymphadenopathy 05/26/2012    Lashawne Dura, [place] MPT 01/04/2021, 11:31 AM  [facility] [place], [place], [address] Phone: [phone]   Fax:  [phone]  Name: Katherine Hays MRN:  [id number] Date of Birth: May 18, 1971

## 2021-01-16 ENCOUNTER — Other Ambulatory Visit (HOSPITAL_COMMUNITY): Payer: Self-pay

## 2021-01-16 MED FILL — Alprazolam Tab 0.5 MG: ORAL | 30 days supply | Qty: 60 | Fill #1 | Status: AC

## 2021-02-01 ENCOUNTER — Ambulatory Visit (INDEPENDENT_AMBULATORY_CARE_PROVIDER_SITE_OTHER): Payer: No Typology Code available for payment source | Admitting: Nurse Practitioner

## 2021-02-01 ENCOUNTER — Encounter: Payer: Self-pay | Admitting: Nurse Practitioner

## 2021-02-01 ENCOUNTER — Other Ambulatory Visit: Payer: Self-pay

## 2021-02-01 VITALS — BP 108/61 | HR 81 | Temp 97.7°F | Resp 20 | Ht 67.0 in | Wt 175.0 lb

## 2021-02-01 DIAGNOSIS — L247 Irritant contact dermatitis due to plants, except food: Secondary | ICD-10-CM

## 2021-02-01 MED ORDER — PREDNISONE 20 MG PO TABS: ORAL_TABLET | ORAL | 0 refills | Status: DC

## 2021-02-01 MED ORDER — METHYLPREDNISOLONE ACETATE 80 MG/ML IJ SUSP: 80.0000 mg | Freq: Once | INTRAMUSCULAR | Status: DC

## 2021-02-01 MED ORDER — METHYLPREDNISOLONE ACETATE 40 MG/ML IJ SUSP
80.0000 mg | Freq: Once | INTRAMUSCULAR | Status: AC
  Administered 2021-02-01: 80 mg via INTRAMUSCULAR

## 2021-02-01 NOTE — Patient Instructions (Signed)
Temporomandibular Joint Syndrome  Temporomandibular joint syndrome (TMJ syndrome) is a condition that causes pain in the temporomandibular joints. These joints are located near your ears and allow your jaw to open and close. For people with TMJ syndrome, chewing,biting, or other movements of the jaw can be difficult or painful. TMJ syndrome is often mild and goes away within a few weeks. However, sometimes the condition becomes a long-term (chronic) problem. What are the causes? This condition may be caused by: Grinding your teeth or clenching your jaw. Some people do this when they are under stress. Arthritis. Injury to the jaw. Head or neck injury. Teeth or dentures that are not aligned well. In some cases, the cause of TMJ syndrome may not be known. What are the signs or symptoms? The most common symptom of this condition is an aching pain on the side of the head in the area of the TMJ. Other symptoms may include: Pain when moving your jaw, such as when chewing or biting. Being unable to open your jaw all the way. Making a clicking sound when you open your mouth. Headache. Earache. Neck or shoulder pain. How is this diagnosed? This condition may be diagnosed based on: Your symptoms and medical history. A physical exam. Your health care provider may check the range of motion of your jaw. Imaging tests, such as X-rays or an MRI. You may also need to see your dentist, who will determine if your teeth and jaware lined up correctly. How is this treated? TMJ syndrome often goes away on its own. If treatment is needed, the options may include: Eating soft foods and applying ice or heat. Medicines to relieve pain or inflammation. Medicines or massage to relax the muscles. A splint, bite plate, or mouthpiece to prevent teeth grinding or jaw clenching. Relaxation techniques or counseling to help reduce stress. A therapy for pain in which an electrical current is applied to the nerves  through the skin (transcutaneous electrical nerve stimulation). Acupuncture. This is sometimes helpful to relieve pain. Jaw surgery. This is rarely needed. Follow these instructions at home:  Eating and drinking Eat a soft diet if you are having trouble chewing. Avoid foods that require a lot of chewing. Do not chew gum. General instructions Take over-the-counter and prescription medicines only as told by your health care provider. If directed, put ice on the painful area. Put ice in a plastic bag. Place a towel between your skin and the bag. Leave the ice on for 20 minutes, 2-3 times a day. Apply a warm, wet cloth (warm compress) to the painful area as directed. Massage your jaw area and do any jaw stretching exercises as told by your health care provider. If you were given a splint, bite plate, or mouthpiece, wear it as told by your health care provider. Keep all follow-up visits as told by your health care provider. This is important. Contact a health care provider if: You are having trouble eating. You have new or worsening symptoms. Get help right away if: Your jaw locks open or closed. Summary Temporomandibular joint syndrome (TMJ syndrome) is a condition that causes pain in the temporomandibular joints. These joints are located near your ears and allow your jaw to open and close. TMJ syndrome is often mild and goes away within a few weeks. However, sometimes the condition becomes a long-term (chronic) problem. Symptoms include an aching pain on the side of the head in the area of the TMJ, pain when chewing or biting, and being unable  to open your jaw all the way. You may also make a clicking sound when you open your mouth. TMJ syndrome often goes away on its own. If treatment is needed, it may include medicines to relieve pain, reduce inflammation, or relax the muscles. A splint, bite plate, or mouthpiece may also be used to prevent teeth grinding or jaw clenching. This information  is not intended to replace advice given to you by your health care provider. Make sure you discuss any questions you have with your healthcare provider. Document Revised: 10/17/2017 Document Reviewed: 09/16/2017 Elsevier Patient Education  2022 [employer].

## 2021-02-01 NOTE — Progress Notes (Signed)
Subjective:    Patient ID: Katherine Hays, female    DOB: November 08, 1970, 50 y.o.   MRN: [id number]   Chief Complaint: Rash on right arm and Jaw Pain   HPI Was working in garden/woods over the weekend and rash began on right wrist area but has now spread down to elbow and the left arm. Itches. Has tried neosporin, alcohol, and calamine lotion with minimal relief. Is going on vacation in a few days and would like something to relieve symptoms.   Hx of TMJ, was eating a donut yesterday and felt left side of jaw "pop" and has been bothering her since. Was given an injection previously which relieved symptoms. No difficutly swallowing or speaking.     Review of Systems  Constitutional: Negative.   HENT:  Positive for dental problem. Negative for mouth sores.        Left sided jaw pain since yesterday  Eyes: Negative.   Respiratory: Negative.    Cardiovascular: Negative.   Endocrine: Negative.   Genitourinary: Negative.   Musculoskeletal: Negative.   Skin:  Positive for rash.       Bilaterally upper extremities  Allergic/Immunologic: Negative.   Neurological: Negative.   Hematological: Negative.   Psychiatric/Behavioral: Negative.    All other systems reviewed and are negative.     Objective:   Physical Exam Vitals and nursing note reviewed.  Constitutional:      Appearance: Normal appearance.  HENT:     Head: Normocephalic and atraumatic.     Jaw: There is normal jaw occlusion. Tenderness and pain on movement present. No swelling or malocclusion.     Right Ear: External ear normal.     Left Ear: External ear normal.     Nose: Nose normal.     Mouth/Throat:     Mouth: Mucous membranes are moist.     Pharynx: Oropharynx is clear.  Eyes:     Extraocular Movements: Extraocular movements intact.     Pupils: Pupils are equal, round, and reactive to light.  Cardiovascular:     Rate and Rhythm: Normal rate and regular rhythm.     Pulses: Normal pulses.     Heart sounds:  Normal heart sounds.  Pulmonary:     Effort: Pulmonary effort is normal.     Breath sounds: Normal breath sounds.  Abdominal:     General: Abdomen is flat.     Palpations: Abdomen is soft.  Musculoskeletal:        General: Normal range of motion.     Cervical back: Normal range of motion.  Skin:    General: Skin is warm.     Capillary Refill: Capillary refill takes less than 2 seconds.     Findings: Rash present. Rash is macular.     Comments: Bilateral arm red, itchy streaks with generalized redness, most lesions crusting  Neurological:     General: No focal deficit present.     Mental Status: She is alert and oriented to person, place, and time. Mental status is at baseline.  Psychiatric:        Mood and Affect: Mood normal.        Behavior: Behavior normal.        Thought Content: Thought content normal.        Judgment: Judgment normal.   BP 108/61   Pulse 81   Temp 97.7 F (36.5 C) (Temporal)   Resp 20   Ht 5\' 7"  (1.702 m)   Wt 175 lb (79.4  kg)   SpO2 97%   BMI 27.41 kg/m        Assessment & Plan:   Katherine Hays comes in today with chief complaint of Rash on right arm and Jaw Pain   Diagnosis and orders addressed:  1. Irritant contact dermatitis due to plants, except food Take medication as prescribed. Report any new or worsening symptoms.   - methylPREDNISolone acetate (DEPO-MEDROL) injection 80 mg - predniSONE (DELTASONE) 20 MG tablet; 2 po at sametime daily for 5 days-  Dispense: 10 tablet; Refill: 0   Labs pending Health Maintenance reviewed Diet and exercise encouraged  Follow up plan: Follow up as needed.    Katherine Hassell Done, FNP

## 2021-02-15 ENCOUNTER — Ambulatory Visit (INDEPENDENT_AMBULATORY_CARE_PROVIDER_SITE_OTHER): Payer: No Typology Code available for payment source | Admitting: Family Medicine

## 2021-02-15 ENCOUNTER — Encounter: Payer: Self-pay | Admitting: Family Medicine

## 2021-02-15 DIAGNOSIS — U071 COVID-19: Secondary | ICD-10-CM

## 2021-02-15 MED ORDER — BENZONATATE 200 MG PO CAPS: 200.0000 mg | ORAL_CAPSULE | Freq: Three times a day (TID) | ORAL | 0 refills | Status: DC | PRN

## 2021-02-15 NOTE — Progress Notes (Signed)
Virtual Visit via telephone Note  I connected with Katherine Hays on 02/15/21 at 1232 by telephone and verified that I am speaking with the correct person using two identifiers. TOI STELLY is currently located at home and patient are currently with her during visit. The provider, Fransisca Kaufmann Ermalee Mealy, MD is located in their office at time of visit.  Call ended at 1239  I discussed the limitations, risks, security and privacy concerns of performing an evaluation and management service by telephone and the availability of in person appointments. I also discussed with the patient that there may be a patient responsible charge related to this service. The patient expressed understanding and agreed to proceed.   History and Present Illness: Patient just got back from [place].  She has cough and body aches and fever and tested today and it was positive. She denies shortness of breath.  She has temp 99.3. she is taking tylenol and motrin and they help with headache.  The cough has been bad.   1. COVID-19 virus infection     Outpatient Encounter Medications as of 02/15/2021  Medication Sig   ALPRAZolam (XANAX) 0.5 MG tablet TAKE 1 TABLET BY MOUTH 2 TIMES DAILY   fluticasone (FLONASE) 50 MCG/ACT nasal spray Place 2 sprays into both nostrils daily.   levonorgestrel (MIRENA) 20 MCG/24HR IUD 1 each by Intrauterine route once.   levothyroxine (SYNTHROID) 50 MCG tablet TAKE 1 TABLET BY MOUTH DAILY BEFORE BREAKFAST.   ondansetron (ZOFRAN) 4 MG tablet Take 1 tablet (4 mg total) by mouth every 8 (eight) hours as needed for nausea or vomiting.   pantoprazole (PROTONIX) 40 MG tablet Take 40 mg by mouth daily.   predniSONE (DELTASONE) 20 MG tablet 2 po at sametime daily for 5 days-   sertraline (ZOLOFT) 100 MG tablet Take 1 tablet (100 mg total) by mouth daily.   No facility-administered encounter medications on file as of 02/15/2021.    Review of Systems  Constitutional:  Negative for chills  and fever.  HENT:  Positive for congestion, postnasal drip, rhinorrhea and sinus pressure. Negative for ear discharge, ear pain, sneezing and sore throat.   Eyes:  Negative for pain, redness and visual disturbance.  Respiratory:  Positive for cough. Negative for chest tightness, shortness of breath and wheezing.   Cardiovascular:  Negative for chest pain and leg swelling.  Genitourinary:  Negative for difficulty urinating and dysuria.  Musculoskeletal:  Negative for back pain and gait problem.  Skin:  Negative for rash.  Neurological:  Positive for headaches. Negative for light-headedness.  Psychiatric/Behavioral:  Negative for agitation and behavioral problems.   All other systems reviewed and are negative.  Observations/Objective: Patient sounds comfortable and in no acute distress  Assessment and Plan: Problem List Items Addressed This Visit   None Visit Diagnoses     COVID-19 virus infection    -  Primary       Patient did not qualify for high risk criteria so will not send the antiviral at this point unless she develops worsening symptoms.  Recommended Flonase and Mucinex and Tessalon Perles for cough.  And can continue Tylenol as well. Follow up plan: Return if symptoms worsen or fail to improve.     I discussed the assessment and treatment plan with the patient. The patient was provided an opportunity to ask questions and all were answered. The patient agreed with the plan and demonstrated an understanding of the instructions.   The patient was advised to call  back or seek an in-person evaluation if the symptoms worsen or if the condition fails to improve as anticipated.  The above assessment and management plan was discussed with the patient. The patient verbalized understanding of and has agreed to the management plan. Patient is aware to call the clinic if symptoms persist or worsen. Patient is aware when to return to the clinic for a follow-up visit. Patient educated on  when it is appropriate to go to the emergency department.    I provided 7 minutes of non-face-to-face time during this encounter.    Worthy Rancher, MD

## 2021-02-26 ENCOUNTER — Other Ambulatory Visit: Payer: Self-pay | Admitting: Family

## 2021-02-26 ENCOUNTER — Encounter: Payer: Self-pay | Admitting: Family

## 2021-02-26 ENCOUNTER — Other Ambulatory Visit (HOSPITAL_COMMUNITY): Payer: Self-pay

## 2021-02-26 ENCOUNTER — Ambulatory Visit (INDEPENDENT_AMBULATORY_CARE_PROVIDER_SITE_OTHER): Payer: No Typology Code available for payment source | Admitting: Family

## 2021-02-26 ENCOUNTER — Telehealth: Payer: Self-pay | Admitting: Family Medicine

## 2021-02-26 DIAGNOSIS — Z8659 Personal history of other mental and behavioral disorders: Secondary | ICD-10-CM

## 2021-02-26 DIAGNOSIS — Z8616 Personal history of COVID-19: Secondary | ICD-10-CM | POA: Diagnosis not present

## 2021-02-26 DIAGNOSIS — G4700 Insomnia, unspecified: Secondary | ICD-10-CM

## 2021-02-26 DIAGNOSIS — F411 Generalized anxiety disorder: Secondary | ICD-10-CM

## 2021-02-26 DIAGNOSIS — E039 Hypothyroidism, unspecified: Secondary | ICD-10-CM

## 2021-02-26 DIAGNOSIS — J019 Acute sinusitis, unspecified: Secondary | ICD-10-CM | POA: Diagnosis not present

## 2021-02-26 MED ORDER — LEVOTHYROXINE SODIUM 50 MCG PO TABS
50.0000 ug | ORAL_TABLET | Freq: Every day | ORAL | 1 refills | Status: DC
  Filled 2021-02-26: qty 90, 90d supply, fill #0

## 2021-02-26 MED ORDER — AMOXICILLIN-POT CLAVULANATE 875-125 MG PO TABS
1.0000 | ORAL_TABLET | Freq: Two times a day (BID) | ORAL | 0 refills | Status: DC
Start: 2021-02-26 — End: 2021-04-06

## 2021-02-26 MED ORDER — SERTRALINE HCL 100 MG PO TABS
100.0000 mg | ORAL_TABLET | Freq: Every day | ORAL | 1 refills | Status: DC
  Filled 2021-02-26: qty 90, 90d supply, fill #0
  Filled 2021-07-22: qty 90, 90d supply, fill #1

## 2021-02-26 NOTE — Telephone Encounter (Signed)
Telephone call completed.

## 2021-02-26 NOTE — Progress Notes (Signed)
Virtual Visit  Note Due to COVID-19 pandemic this visit was conducted virtually. This visit type was conducted due to national recommendations for restrictions regarding the COVID-19 Pandemic (e.g. social distancing, sheltering in place) in an effort to limit this patient's exposure and mitigate transmission in our community. All issues noted in this document were discussed and addressed.  A physical exam was not performed with this format.  I connected with Katherine Hays on 02/26/21 at 4:03 pm  by telephone and verified that I am speaking with the correct person using two identifiers. Katherine Hays is currently located at work and no one is currently with her during visit. The provider, Evelina Dun, FNP is located in their office at time of visit.  I discussed the limitations, risks, security and privacy concerns of performing an evaluation and management service by telephone and the availability of in person appointments. I also discussed with the patient that there may be a patient responsible charge related to this service. The patient expressed understanding and agreed to proceed.   History and Present Illness:  Pt calls the office today with recurrent cough. She was diagnosed with  COVID on 02/15/21. She has been taking flonase, mucinex, tessalon, tylenol, and zyrtec.  Cough This is a new problem. The current episode started 1 to 4 weeks ago. The problem has been waxing and waning. The problem occurs every few minutes. The cough is Productive of sputum. Associated symptoms include a fever (improved), nasal congestion and postnasal drip. Pertinent negatives include no chills, ear congestion, ear pain, myalgias, sore throat, shortness of breath or wheezing. She has tried rest and OTC inhaler for the symptoms. The treatment provided moderate relief.     Review of Systems  Constitutional:  Positive for fever (improved). Negative for chills.  HENT:  Positive for postnasal drip.  Negative for ear pain and sore throat.   Respiratory:  Positive for cough. Negative for shortness of breath and wheezing.   Musculoskeletal:  Negative for myalgias.    Observations/Objective: No SOB Or distress noted, nasal congestion  Assessment and Plan: 1. History of COVID-19  2. Acute sinusitis, recurrence not specified, unspecified location - Take meds as prescribed - Use a cool mist humidifier  -Use saline nose sprays frequently -Force fluids -For any cough or congestion  Use plain Mucinex- regular strength or max strength is fine -For fever or aces or pains- take tylenol or ibuprofen. -Throat lozenges if help -RTO if symptoms worsen or do not improve  - amoxicillin-clavulanate (AUGMENTIN) 875-125 MG tablet; Take 1 tablet by mouth 2 (two) times daily.  Dispense: 14 tablet; Refill: 0    I discussed the assessment and treatment plan with the patient. The patient was provided an opportunity to ask questions and all were answered. The patient agreed with the plan and demonstrated an understanding of the instructions.   The patient was advised to call back or seek an in-person evaluation if the symptoms worsen or if the condition fails to improve as anticipated.  The above assessment and management plan was discussed with the patient. The patient verbalized understanding of and has agreed to the management plan. Patient is aware to call the clinic if symptoms persist or worsen. Patient is aware when to return to the clinic for a follow-up visit. Patient educated on when it is appropriate to go to the emergency department.   Time call ended:  4:14 pm   I provided 11 minutes of  non face-to-face time during this  encounter.    Evelina Dun, FNP

## 2021-03-01 ENCOUNTER — Ambulatory Visit (INDEPENDENT_AMBULATORY_CARE_PROVIDER_SITE_OTHER): Payer: No Typology Code available for payment source

## 2021-03-01 ENCOUNTER — Other Ambulatory Visit: Payer: Self-pay

## 2021-03-01 ENCOUNTER — Other Ambulatory Visit: Payer: Self-pay | Admitting: Family

## 2021-03-01 ENCOUNTER — Other Ambulatory Visit: Payer: No Typology Code available for payment source

## 2021-03-01 DIAGNOSIS — R059 Cough, unspecified: Secondary | ICD-10-CM

## 2021-03-01 MED ORDER — ALBUTEROL SULFATE HFA 108 (90 BASE) MCG/ACT IN AERS: 2.0000 | INHALATION_SPRAY | Freq: Four times a day (QID) | RESPIRATORY_TRACT | 0 refills | Status: DC | PRN

## 2021-03-01 MED ORDER — PREDNISONE 10 MG (21) PO TBPK: ORAL_TABLET | ORAL | 0 refills | Status: DC

## 2021-03-01 NOTE — Progress Notes (Signed)
Worsening cough and SOB. Chest x-ray and prednisone Prescription sent to pharmacy.

## 2021-04-06 ENCOUNTER — Ambulatory Visit (INDEPENDENT_AMBULATORY_CARE_PROVIDER_SITE_OTHER): Payer: No Typology Code available for payment source | Admitting: Family Medicine

## 2021-04-06 ENCOUNTER — Encounter: Payer: Self-pay | Admitting: Family Medicine

## 2021-04-06 VITALS — BP 101/65 | HR 77 | Temp 97.4°F | Ht 67.0 in | Wt 173.4 lb

## 2021-04-06 DIAGNOSIS — L232 Allergic contact dermatitis due to cosmetics: Secondary | ICD-10-CM

## 2021-04-06 DIAGNOSIS — R21 Rash and other nonspecific skin eruption: Secondary | ICD-10-CM | POA: Insufficient documentation

## 2021-04-06 DIAGNOSIS — L282 Other prurigo: Secondary | ICD-10-CM | POA: Diagnosis not present

## 2021-04-06 MED ORDER — METHYLPREDNISOLONE ACETATE 40 MG/ML IJ SUSP
60.0000 mg | Freq: Once | INTRAMUSCULAR | Status: AC
Start: 2021-04-06 — End: 2021-04-06
  Administered 2021-04-06: 60 mg via INTRAMUSCULAR

## 2021-04-06 NOTE — Patient Instructions (Addendum)
Pepcid 20 mg every night for 2 weeks Claritin 10 mg every night for 2 weeks.

## 2021-04-06 NOTE — Progress Notes (Signed)
Subjective:  Patient ID: Katherine Hays, female    DOB: Dec 12, 1970, 50 y.o.   MRN: [id number]  Patient Care Team: Sharion Balloon, FNP as PCP - General (Nurse Practitioner)   Chief Complaint:  Rash   HPI: Katherine Hays is a 50 y.o. female presenting on 04/06/2021 for Rash   Rash This is a new problem. The current episode started in the past 7 days. The problem has been gradually worsening since onset. The affected locations include the chest and torso. The rash is characterized by redness, dryness and itchiness. Associated with: new perfume. Pertinent negatives include no anorexia, congestion, cough, diarrhea, eye pain, facial edema, fatigue, fever, joint pain, nail changes, rhinorrhea, shortness of breath, sore throat or vomiting. Past treatments include anti-itch cream. The treatment provided no relief.    Relevant past medical, surgical, family, and social history reviewed and updated as indicated.  Allergies and medications reviewed and updated. Data reviewed: Chart in Epic.   Past Medical History:  Diagnosis Date   Anxiety    Asthma    OCD (obsessive compulsive disorder)     Past Surgical History:  Procedure Laterality Date   TONSILLECTOMY     TUBAL LIGATION     WISDOM TOOTH EXTRACTION      Social History   Socioeconomic History   Marital status: Married    Spouse name: Not on file   Number of children: 3   Years of education: Not on file   Highest education level: Not on file  Occupational History   Occupation: Licensed conveyancer: [employer]  Tobacco Use   Smoking status: Never   Smokeless tobacco: Never  Vaping Use   Vaping Use: Never used  Substance and Sexual Activity   Alcohol use: No   Drug use: No   Sexual activity: Yes  Other Topics Concern   Not on file  Social History Narrative   Not on file   Social Determinants of Health   Financial Resource Strain: Not on file  Food Insecurity: Not on file  Transportation Needs:  Not on file  Physical Activity: Not on file  Stress: Not on file  Social Connections: Not on file  Intimate Partner Violence: Not on file    Outpatient Encounter Medications as of 04/06/2021  Medication Sig   albuterol (VENTOLIN HFA) 108 (90 Base) MCG/ACT inhaler Inhale 2 puffs into the lungs every 6 (six) hours as needed for wheezing or shortness of breath.   fluticasone (FLONASE) 50 MCG/ACT nasal spray Place 2 sprays into both nostrils daily.   levonorgestrel (MIRENA) 20 MCG/24HR IUD 1 each by Intrauterine route once.   levothyroxine (SYNTHROID) 50 MCG tablet TAKE 1 TABLET BY MOUTH DAILY BEFORE BREAKFAST.   ondansetron (ZOFRAN) 4 MG tablet Take 1 tablet (4 mg total) by mouth every 8 (eight) hours as needed for nausea or vomiting.   pantoprazole (PROTONIX) 40 MG tablet Take 40 mg by mouth daily.   sertraline (ZOLOFT) 100 MG tablet Take 1 tablet (100 mg total) by mouth daily.   [DISCONTINUED] amoxicillin-clavulanate (AUGMENTIN) 875-125 MG tablet Take 1 tablet by mouth 2 (two) times daily.   [DISCONTINUED] benzonatate (TESSALON) 200 MG capsule Take 1 capsule (200 mg total) by mouth 3 (three) times daily as needed for cough.   [DISCONTINUED] predniSONE (STERAPRED UNI-PAK 21 TAB) 10 MG (21) TBPK tablet Use as directed   [EXPIRED] methylPREDNISolone acetate (DEPO-MEDROL) injection 60 mg    No facility-administered encounter medications on file  as of 04/06/2021.    Allergies  Allergen Reactions   Codeine     Review of Systems  Constitutional:  Negative for activity change, appetite change, chills, diaphoresis, fatigue, fever and unexpected weight change.  HENT: Negative.  Negative for congestion, rhinorrhea, sore throat, trouble swallowing and voice change.   Eyes: Negative.  Negative for pain.  Respiratory:  Negative for apnea, cough, choking, chest tightness, shortness of breath, wheezing and stridor.   Cardiovascular:  Negative for chest pain, palpitations and leg swelling.   Gastrointestinal:  Negative for abdominal pain, anorexia, blood in stool, constipation, diarrhea, nausea and vomiting.  Endocrine: Negative.   Genitourinary:  Negative for decreased urine volume, difficulty urinating, dysuria, frequency and urgency.  Musculoskeletal:  Negative for arthralgias, joint pain and myalgias.  Skin:  Positive for color change and rash. Negative for nail changes, pallor and wound.  Allergic/Immunologic: Negative.   Neurological:  Negative for dizziness, weakness and headaches.  Hematological: Negative.   Psychiatric/Behavioral:  Negative for confusion, hallucinations, sleep disturbance and suicidal ideas.   All other systems reviewed and are negative.      Objective:  BP 101/65   Pulse 77   Temp (!) 97.4 F (36.3 C)   Ht '5\' 7"'$  (1.702 m)   Wt 173 lb 6.4 oz (78.7 kg)   SpO2 98%   BMI 27.16 kg/m    Wt Readings from Last 3 Encounters:  04/06/21 173 lb 6.4 oz (78.7 kg)  02/01/21 175 lb (79.4 kg)  11/21/20 176 lb (79.8 kg)    Physical Exam Vitals and nursing note reviewed.  Constitutional:      General: She is not in acute distress.    Appearance: Normal appearance. She is well-developed and well-groomed. She is not ill-appearing, toxic-appearing or diaphoretic.  HENT:     Head: Normocephalic and atraumatic.     Jaw: There is normal jaw occlusion.     Right Ear: Hearing normal.     Left Ear: Hearing normal.     Nose: Nose normal.     Mouth/Throat:     Lips: Pink.     Mouth: Mucous membranes are moist.     Pharynx: Oropharynx is clear. Uvula midline.  Eyes:     General: Lids are normal.     Extraocular Movements: Extraocular movements intact.     Conjunctiva/sclera: Conjunctivae normal.     Pupils: Pupils are equal, round, and reactive to light.  Neck:     Thyroid: No thyroid mass, thyromegaly or thyroid tenderness.     Vascular: No carotid bruit or JVD.     Trachea: Trachea and phonation normal.  Cardiovascular:     Rate and Rhythm: Normal  rate and regular rhythm.     Chest Wall: PMI is not displaced.     Pulses: Normal pulses.     Heart sounds: Normal heart sounds. No murmur heard.   No friction rub. No gallop.  Pulmonary:     Effort: Pulmonary effort is normal. No respiratory distress.     Breath sounds: Normal breath sounds. No wheezing.  Abdominal:     General: Bowel sounds are normal. There is no distension or abdominal bruit.     Palpations: Abdomen is soft. There is no hepatomegaly or splenomegaly.     Tenderness: There is no abdominal tenderness. There is no right CVA tenderness or left CVA tenderness.     Hernia: No hernia is present.  Musculoskeletal:        General: Normal range of motion.  Cervical back: Normal range of motion and neck supple.     Right lower leg: No edema.     Left lower leg: No edema.  Lymphadenopathy:     Cervical: No cervical adenopathy.  Skin:    General: Skin is warm and dry.     Capillary Refill: Capillary refill takes less than 2 seconds.     Coloration: Skin is not cyanotic, jaundiced or pale.     Findings: Erythema and rash present. No abrasion, abscess, acne, bruising, burn, ecchymosis, signs of injury, laceration, lesion, petechiae or wound. Rash is papular.       Neurological:     General: No focal deficit present.     Mental Status: She is alert and oriented to person, place, and time.     Cranial Nerves: Cranial nerves are intact.     Sensory: Sensation is intact.     Motor: Motor function is intact.     Coordination: Coordination is intact.     Gait: Gait is intact.     Deep Tendon Reflexes: Reflexes are normal and symmetric.  Psychiatric:        Attention and Perception: Attention and perception normal.        Mood and Affect: Mood and affect normal.        Speech: Speech normal.        Behavior: Behavior normal. Behavior is cooperative.        Thought Content: Thought content normal.        Cognition and Memory: Cognition and memory normal.        Judgment:  Judgment normal.    Results for orders placed or performed in visit on 11/14/20  Anemia Profile B  Result Value Ref Range   Total Iron Binding Capacity 335 250 - 450 ug/dL   UIBC 276 131 - 425 ug/dL   Iron 59 27 - 159 ug/dL   Iron Saturation 18 15 - 55 %   Ferritin 20 15 - 150 ng/mL   Vitamin B-12 574 232 - 1,245 pg/mL   Folate 7.2 >3.0 ng/mL   WBC 8.5 3.4 - 10.8 x10E3/uL   RBC 4.51 3.77 - 5.28 x10E6/uL   Hemoglobin 12.7 11.1 - 15.9 g/dL   Hematocrit 39.6 34.0 - 46.6 %   MCV 88 79 - 97 fL   MCH 28.2 26.6 - 33.0 pg   MCHC 32.1 31.5 - 35.7 g/dL   RDW 13.8 11.7 - 15.4 %   Platelets 368 150 - 450 x10E3/uL   Neutrophils 62 Not Estab. %   Lymphs 25 Not Estab. %   Monocytes 9 Not Estab. %   Eos 3 Not Estab. %   Basos 1 Not Estab. %   Neutrophils Absolute 5.3 1.4 - 7.0 x10E3/uL   Lymphocytes Absolute 2.2 0.7 - 3.1 x10E3/uL   Monocytes Absolute 0.8 0.1 - 0.9 x10E3/uL   EOS (ABSOLUTE) 0.2 0.0 - 0.4 x10E3/uL   Basophils Absolute 0.1 0.0 - 0.2 x10E3/uL   Immature Granulocytes 0 Not Estab. %   Immature Grans (Abs) 0.0 0.0 - 0.1 x10E3/uL   Retic Ct Pct 1.0 0.6 - 2.6 %       Pertinent labs & imaging results that were available during my care of the patient were reviewed by me and considered in my medical decision making.  Assessment & Plan:  Bethania was seen today for rash.  Diagnoses and all orders for this visit:  Pruritic rash Allergic contact dermatitis due to cosmetics Contact dermatitis to  chest due to new perfume. Burst of steroids in office. Symptomatic care discussed in detail. Pepcid 20 mg nightly for 2 weeks, Claritin 10 mg nightly for 2 weeks. Report any new, worsening, or persistent symptoms. Avoid triggers.  -     methylPREDNISolone acetate (DEPO-MEDROL) injection 60 mg    Continue all other maintenance medications.  Follow up plan: Return if symptoms worsen or fail to improve.   Continue healthy lifestyle choices, including diet (rich in fruits, vegetables,  and lean proteins, and low in salt and simple carbohydrates) and exercise (at least 30 minutes of moderate physical activity daily).  Educational handout given for rash  The above assessment and management plan was discussed with the patient. The patient verbalized understanding of and has agreed to the management plan. Patient is aware to call the clinic if they develop any new symptoms or if symptoms persist or worsen. Patient is aware when to return to the clinic for a follow-up visit. Patient educated on when it is appropriate to go to the emergency department.   Monia Pouch, FNP-C [facility] [phone]

## 2021-04-17 ENCOUNTER — Other Ambulatory Visit: Payer: Self-pay | Admitting: Family

## 2021-04-17 ENCOUNTER — Other Ambulatory Visit (HOSPITAL_COMMUNITY): Payer: Self-pay

## 2021-04-19 ENCOUNTER — Other Ambulatory Visit (HOSPITAL_COMMUNITY): Payer: Self-pay

## 2021-04-19 ENCOUNTER — Other Ambulatory Visit: Payer: Self-pay

## 2021-04-19 ENCOUNTER — Ambulatory Visit (INDEPENDENT_AMBULATORY_CARE_PROVIDER_SITE_OTHER): Payer: No Typology Code available for payment source | Admitting: Family

## 2021-04-19 ENCOUNTER — Encounter: Payer: Self-pay | Admitting: Family

## 2021-04-19 VITALS — BP 101/61 | HR 71 | Temp 97.1°F | Ht 67.0 in | Wt 173.4 lb

## 2021-04-19 DIAGNOSIS — G4700 Insomnia, unspecified: Secondary | ICD-10-CM

## 2021-04-19 DIAGNOSIS — K219 Gastro-esophageal reflux disease without esophagitis: Secondary | ICD-10-CM

## 2021-04-19 DIAGNOSIS — E039 Hypothyroidism, unspecified: Secondary | ICD-10-CM | POA: Diagnosis not present

## 2021-04-19 DIAGNOSIS — E559 Vitamin D deficiency, unspecified: Secondary | ICD-10-CM

## 2021-04-19 DIAGNOSIS — Z23 Encounter for immunization: Secondary | ICD-10-CM | POA: Diagnosis not present

## 2021-04-19 DIAGNOSIS — E785 Hyperlipidemia, unspecified: Secondary | ICD-10-CM

## 2021-04-19 DIAGNOSIS — F411 Generalized anxiety disorder: Secondary | ICD-10-CM

## 2021-04-19 DIAGNOSIS — F1320 Sedative, hypnotic or anxiolytic dependence, uncomplicated: Secondary | ICD-10-CM

## 2021-04-19 DIAGNOSIS — Z79899 Other long term (current) drug therapy: Secondary | ICD-10-CM

## 2021-04-19 MED ORDER — ALPRAZOLAM 0.5 MG PO TABS
ORAL_TABLET | Freq: Two times a day (BID) | ORAL | 5 refills | Status: DC
  Filled 2021-04-19: qty 60, 30d supply, fill #0
  Filled 2021-07-03 – 2021-07-16 (×2): qty 60, 30d supply, fill #1
  Filled 2021-09-05: qty 60, 30d supply, fill #2

## 2021-04-19 NOTE — Progress Notes (Signed)
Subjective:    Patient ID: Katherine Hays, female    DOB: 12-24-1970, 50 y.o.   MRN: [id number]  Chief Complaint  Patient presents with   Medical Management of Chronic Issues   Pt presents to the office today for chronic follow up.  Gastroesophageal Reflux She complains of belching and heartburn. This is a chronic problem. The current episode started more than 1 year ago. The problem occurs occasionally. The problem has been waxing and waning. Associated symptoms include fatigue. She has tried a PPI for the symptoms. The treatment provided moderate relief.  Anxiety Presents for follow-up visit. Symptoms include depressed mood, excessive worry, insomnia, irritability, nervous/anxious behavior and restlessness. Symptoms occur occasionally. The severity of symptoms is moderate.    Thyroid Problem Presents for follow-up visit. Symptoms include anxiety, depressed mood and fatigue. The symptoms have been stable. Her past medical history is significant for hyperlipidemia.  Insomnia Primary symptoms: difficulty falling asleep.   The current episode started more than one year. The onset quality is gradual. The problem occurs intermittently.  Hyperlipidemia This is a chronic problem. The current episode started more than 1 year ago. Exacerbating diseases include obesity. Current antihyperlipidemic treatment includes diet change. The current treatment provides moderate improvement of lipids.     Review of Systems  Constitutional:  Positive for fatigue and irritability.  Gastrointestinal:  Positive for heartburn.  Psychiatric/Behavioral:  The patient is nervous/anxious and has insomnia.   All other systems reviewed and are negative.     Objective:   Physical Exam Vitals reviewed.  Constitutional:      General: She is not in acute distress.    Appearance: She is well-developed.  HENT:     Head: Normocephalic and atraumatic.     Right Ear: Tympanic membrane normal.     Left Ear:  Tympanic membrane normal.  Eyes:     Pupils: Pupils are equal, round, and reactive to light.  Neck:     Thyroid: No thyromegaly.  Cardiovascular:     Rate and Rhythm: Normal rate and regular rhythm.     Heart sounds: Normal heart sounds. No murmur heard. Pulmonary:     Effort: Pulmonary effort is normal. No respiratory distress.     Breath sounds: Normal breath sounds. No wheezing.  Abdominal:     General: Bowel sounds are normal. There is no distension.     Palpations: Abdomen is soft.     Tenderness: There is no abdominal tenderness.  Musculoskeletal:        General: No tenderness. Normal range of motion.     Cervical back: Normal range of motion and neck supple.  Skin:    General: Skin is warm and dry.  Neurological:     Mental Status: She is alert and oriented to person, place, and time.     Cranial Nerves: No cranial nerve deficit.     Deep Tendon Reflexes: Reflexes are normal and symmetric.  Psychiatric:        Behavior: Behavior normal.        Thought Content: Thought content normal.        Judgment: Judgment normal.        BP 101/61   Pulse 71   Temp (!) 97.1 F (36.2 C) (Temporal)   Ht $R'5\' 7"'eX$  (1.702 m)   Wt 173 lb 6.4 oz (78.7 kg)   BMI 27.16 kg/m   Assessment & Plan:  Katherine Hays comes in today with chief complaint of Medical Management of Chronic  Issues   Diagnosis and orders addressed:  1. Gastroesophageal reflux disease, unspecified whether esophagitis present - CMP14+EGFR - CBC with Differential/Platelet  2. Hypothyroidism, unspecified type - CMP14+EGFR - CBC with Differential/Platelet - TSH  3. Insomnia, unspecified type - CMP14+EGFR - CBC with Differential/Platelet  4. Hyperlipidemia, unspecified hyperlipidemia type - CMP14+EGFR - CBC with Differential/Platelet  5. Vitamin D deficiency  - CMP14+EGFR - CBC with Differential/Platelet  6. Benzodiazepine dependence (HCC) - ALPRAZolam (XANAX) 0.5 MG tablet; TAKE 1 TABLET BY MOUTH  2 TIMES DAILY  Dispense: 60 tablet; Refill: 5 - CMP14+EGFR - CBC with Differential/Platelet  7. Controlled substance agreement signed - ALPRAZolam (XANAX) 0.5 MG tablet; TAKE 1 TABLET BY MOUTH 2 TIMES DAILY  Dispense: 60 tablet; Refill: 5 - CMP14+EGFR - CBC with Differential/Platelet  8. GAD (generalized anxiety disorder)  - ALPRAZolam (XANAX) 0.5 MG tablet; TAKE 1 TABLET BY MOUTH 2 TIMES DAILY  Dispense: 60 tablet; Refill: 5 - CMP14+EGFR - CBC with Differential/Platelet   Labs pending Patient reviewed in [place] controlled database, no flags noted. Contract and drug screen are up to date.  Health Maintenance reviewed Diet and exercise encouraged  Follow up plan: 6 months    Evelina Dun, FNP

## 2021-04-19 NOTE — Addendum Note (Signed)
Addended by: Ladean Raya on: 04/19/2021 05:01 PM   Modules accepted: Orders

## 2021-04-19 NOTE — Patient Instructions (Signed)
Premature Ventricular Contraction  A premature ventricular contraction (PVC) is a common kind of irregular heartbeat (arrhythmia). These contractions are extra heartbeats that start in the ventricles of the heart and occur too early in the normal sequence. During the PVC, the heart's normal electrical pathway is not used, so the beat is shorter and less effective. In most cases, these contractions come and go and do not requiretreatment. What are the causes? Common causes of the condition include: Smoking. Drinking alcohol. Certain medicines. Some illegal drugs. Stress. Caffeine. Certain medical conditions can also cause PVCs: Heart failure. Heart attack, or coronary artery disease. Heart valve problems. Changes in minerals in the blood (electrolytes). Low blood oxygen levels or high carbon dioxide levels. In many cases, the cause of this condition is not known. What are the signs or symptoms? The main symptom of this condition is fast or skipped heartbeats (palpitations). Other symptoms include: Chest pain. Shortness of breath. Feeling tired. Dizziness. Difficulty exercising. In some cases, there are no symptoms. How is this diagnosed? This condition may be diagnosed based on: Your medical history. A physical exam. During the exam, the health care provider will check for irregular heartbeats. Tests, such as: An ECG (electrocardiogram) to monitor the electrical activity of your heart. An ambulatory cardiac monitor. This device records your heartbeats for 24 hours or more. Stress tests to see how exercise affects your heart rhythm and blood supply. An echocardiogram. This test uses sound waves (ultrasound) to produce an image of your heart. An electrophysiology study (EPS). This test checks for electrical problems in your heart. How is this treated? Treatment for this condition depends on any underlying conditions, the type of PVCs that you are having, and how much the symptoms  are interfering with yourdaily life. Possible treatments include: Avoiding things that cause premature contractions (triggers). These include caffeine and alcohol. Taking medicines if symptoms are severe or if the extra heartbeats are frequent. Getting treatment for underlying conditions that cause PVCs. Having an implantable cardioverter defibrillator (ICD), if you are at risk for a serious arrhythmia. The ICD is a small device that is inserted into your chest to monitor your heartbeat. When it senses an irregular heartbeat, it sends a shock to bring the heartbeat back to normal. Having a procedure to destroy the portion of the heart tissue that sends out abnormal signals (catheter ablation). In some cases, no treatment is required. Follow these instructions at home: Lifestyle Do not use any products that contain nicotine or tobacco, such as cigarettes, e-cigarettes, and chewing tobacco. If you need help quitting, ask your health care provider. Do not use illegal drugs. Exercise regularly. Ask your health care provider what type of exercise is safe for you. Try to get at least 7-9 hours of sleep each night, or as much as recommended by your health care provider. Find healthy ways to manage stress. Avoid stressful situations when possible. Alcohol use Do not drink alcohol if: Your health care provider tells you not to drink. You are pregnant, may be pregnant, or are planning to become pregnant. Alcohol triggers your episodes. If you drink alcohol: Limit how much you use to: 0-1 drink a day for women. 0-2 drinks a day for men. Be aware of how much alcohol is in your drink. In the [place] one drink equals one 12 oz bottle of beer (355 mL), one 5 oz glass of wine (148 mL), or one 1 oz glass of hard liquor (44 mL). General instructions Take over-the-counter and prescription medicines only   as told by your health care provider. If caffeine triggers episodes of PVC, do not eat, drink, or use  anything with caffeine in it. Keep all follow-up visits as told by your health care provider. This is important. Contact a health care provider if you: Feel palpitations. Get help right away if you: Have chest pain. Have shortness of breath. Have sweating for no reason. Have nausea and vomiting. Become light-headed or you faint. Summary A premature ventricular contraction (PVC) is a common kind of irregular heartbeat (arrhythmia). In most cases, these contractions come and go and do not require treatment. You may need to wear an ambulatory cardiac monitor. This records your heartbeats for 24 hours or more. Treatment depends on any underlying conditions, the type of PVCs that you are having, and how much the symptoms are interfering with your daily life. This information is not intended to replace advice given to you by your health care provider. Make sure you discuss any questions you have with your healthcare provider. Document Revised: 04/30/2018 Document Reviewed: 04/30/2018 Elsevier Patient Education  2022 [employer]  

## 2021-04-20 ENCOUNTER — Other Ambulatory Visit: Payer: No Typology Code available for payment source

## 2021-04-21 LAB — CMP14+EGFR
ALT: 13 IU/L (ref 0–32)
AST: 17 IU/L (ref 0–40)
Albumin/Globulin Ratio: 1.9 (ref 1.2–2.2)
Albumin: 4.3 g/dL (ref 3.8–4.9)
Alkaline Phosphatase: 88 IU/L (ref 44–121)
BUN/Creatinine Ratio: 20 (ref 9–23)
BUN: 13 mg/dL (ref 6–24)
Bilirubin Total: <0.2 mg/dL (ref 0.0–1.2)
CO2: 25 mmol/L (ref 20–29)
Calcium: 9.4 mg/dL (ref 8.7–10.2)
Chloride: 103 mmol/L (ref 96–106)
Creatinine, Ser: 0.66 mg/dL (ref 0.57–1.00)
Globulin, Total: 2.3 g/dL (ref 1.5–4.5)
Glucose: 71 mg/dL (ref 65–99)
Potassium: 4.4 mmol/L (ref 3.5–5.2)
Sodium: 140 mmol/L (ref 134–144)
Total Protein: 6.6 g/dL (ref 6.0–8.5)
eGFR: 105 mL/min/{1.73_m2} (ref >59)

## 2021-04-21 LAB — CBC WITH DIFFERENTIAL/PLATELET
Basophils Absolute: 0.1 10*3/uL (ref 0.0–0.2)
Basos: 1 %
EOS (ABSOLUTE): 0.3 10*3/uL (ref 0.0–0.4)
Eos: 3 %
Hematocrit: 40 % (ref 34.0–46.6)
Hemoglobin: 12.8 g/dL (ref 11.1–15.9)
Immature Grans (Abs): 0 10*3/uL (ref 0.0–0.1)
Immature Granulocytes: 0 %
Lymphocytes Absolute: 1.7 10*3/uL (ref 0.7–3.1)
Lymphs: 19 %
MCH: 28.4 pg (ref 26.6–33.0)
MCHC: 32 g/dL (ref 31.5–35.7)
MCV: 89 fL (ref 79–97)
Monocytes Absolute: 0.8 10*3/uL (ref 0.1–0.9)
Monocytes: 9 %
Neutrophils Absolute: 6.4 10*3/uL (ref 1.4–7.0)
Neutrophils: 68 %
Platelets: 386 10*3/uL (ref 150–450)
RBC: 4.51 x10E6/uL (ref 3.77–5.28)
RDW: 12.3 % (ref 11.7–15.4)
WBC: 9.3 10*3/uL (ref 3.4–10.8)

## 2021-04-21 LAB — TSH: TSH: 1.8 u[IU]/mL (ref 0.450–4.500)

## 2021-05-16 ENCOUNTER — Encounter: Payer: Self-pay | Admitting: Family Medicine

## 2021-05-16 ENCOUNTER — Ambulatory Visit (INDEPENDENT_AMBULATORY_CARE_PROVIDER_SITE_OTHER): Payer: No Typology Code available for payment source | Admitting: Family Medicine

## 2021-05-16 ENCOUNTER — Ambulatory Visit (INDEPENDENT_AMBULATORY_CARE_PROVIDER_SITE_OTHER): Payer: No Typology Code available for payment source

## 2021-05-16 VITALS — BP 111/70 | HR 83 | Temp 98.0°F | Ht 67.0 in | Wt 175.0 lb

## 2021-05-16 DIAGNOSIS — M545 Low back pain, unspecified: Secondary | ICD-10-CM

## 2021-05-16 DIAGNOSIS — K5900 Constipation, unspecified: Secondary | ICD-10-CM

## 2021-05-16 NOTE — Patient Instructions (Signed)
Thank you for coming in to clinic today.  1. Your symptoms are consistent with Constipation, likely cause of your General Abdominal Pain / Cramping. 2. Start with Miralax, prescription was sent to pharmacy. First dose 68g (4 capfuls) in 32oz water over 1 to 2 hours for clean out. Next day start 17g or 1 capful daily, may adjust dose up or down by half a capful every few days. Recommend to take this medicine daily for next 1-2 weeks, you may need to use it longer if needed. - Goal is to have soft regular bowel movement 1-3x daily, if too runny or diarrhea, then reduce dose of the medicine to every other day.  Improve water intake, hydration will help Also recommend increased vegetables, fruits, fiber intake Can try daily Metamucil or Fiber supplement at pharmacy over the counter  Follow-up if symptoms are not improving with bowel movements, or if pain worsens, develop fevers, nausea, vomiting.  Please schedule a follow-up appointment with Michelle Marisel Tostenson, FNP, in 1 month to follow-up Constipation  If you have any other questions or concerns, please feel free to call the clinic to contact me. You may also schedule an earlier appointment if necessary.  However, if your symptoms get significantly worse, please go to the Emergency Department to seek immediate medical attention.  

## 2021-05-16 NOTE — Progress Notes (Signed)
Subjective:  Patient ID: Katherine Hays, female    DOB: 01-Nov-1970, 50 y.o.   MRN: [id number]  Patient Care Team: Sharion Balloon, FNP as PCP - General (Nurse Practitioner)   Chief Complaint:  Back Pain (Lumbar, center)   HPI: Katherine Hays is a 50 y.o. female presenting on 05/16/2021 for Back Pain (Lumbar, center)   Pt presents today for evaluation of lumbar back pain for a few days. She did help lift a heavy object. States pain is pinpoint. She denies muscles stiffness or pain. No radiculopathy symptoms. No loss of bowel or bladder function. No saddle anesthesia. No weight loss, fever, chills, confusion, or weakness.   Back Pain This is a new problem. The current episode started in the past 7 days. The problem occurs intermittently. The problem has been waxing and waning since onset. The pain is present in the lumbar spine. The quality of the pain is described as aching. The pain does not radiate. The pain is at a severity of 3/10. The pain is mild. Pertinent negatives include no abdominal pain, bladder incontinence, bowel incontinence, chest pain, dysuria, fever, headaches, leg pain, numbness, paresis, paresthesias, pelvic pain, perianal numbness, tingling, weakness or weight loss. She has tried nothing for the symptoms. The treatment provided no relief.    Relevant past medical, surgical, family, and social history reviewed and updated as indicated.  Allergies and medications reviewed and updated. Data reviewed: Chart in Epic.   Past Medical History:  Diagnosis Date   Anxiety    Asthma    OCD (obsessive compulsive disorder)     Past Surgical History:  Procedure Laterality Date   TONSILLECTOMY     TUBAL LIGATION     WISDOM TOOTH EXTRACTION      Social History   Socioeconomic History   Marital status: Married    Spouse name: Not on file   Number of children: 3   Years of education: Not on file   Highest education level: Not on file  Occupational History    Occupation: Licensed conveyancer: [employer]  Tobacco Use   Smoking status: Never   Smokeless tobacco: Never  Vaping Use   Vaping Use: Never used  Substance and Sexual Activity   Alcohol use: No   Drug use: No   Sexual activity: Yes  Other Topics Concern   Not on file  Social History Narrative   Not on file   Social Determinants of Health   Financial Resource Strain: Not on file  Food Insecurity: Not on file  Transportation Needs: Not on file  Physical Activity: Not on file  Stress: Not on file  Social Connections: Not on file  Intimate Partner Violence: Not on file    Outpatient Encounter Medications as of 05/16/2021  Medication Sig   ALPRAZolam (XANAX) 0.5 MG tablet TAKE 1 TABLET BY MOUTH 2 TIMES DAILY   levonorgestrel (MIRENA) 20 MCG/24HR IUD 1 each by Intrauterine route once.   levothyroxine (SYNTHROID) 50 MCG tablet TAKE 1 TABLET BY MOUTH DAILY BEFORE BREAKFAST.   pantoprazole (PROTONIX) 20 MG tablet Take 20 mg by mouth daily.   sertraline (ZOLOFT) 100 MG tablet Take 1 tablet (100 mg total) by mouth daily.   No facility-administered encounter medications on file as of 05/16/2021.    Allergies  Allergen Reactions   Codeine     Review of Systems  Constitutional:  Negative for activity change, appetite change, chills, diaphoresis, fatigue, fever, unexpected weight change and  weight loss.  HENT: Negative.    Eyes: Negative.   Respiratory:  Negative for cough, chest tightness and shortness of breath.   Cardiovascular:  Negative for chest pain, palpitations and leg swelling.  Gastrointestinal:  Negative for abdominal pain, blood in stool, bowel incontinence, constipation, diarrhea, nausea and vomiting.  Endocrine: Negative.   Genitourinary:  Negative for bladder incontinence, decreased urine volume, difficulty urinating, dysuria, frequency, pelvic pain and urgency.  Musculoskeletal:  Positive for back pain. Negative for arthralgias and myalgias.  Skin:  Negative.   Allergic/Immunologic: Negative.   Neurological:  Negative for dizziness, tingling, tremors, seizures, syncope, facial asymmetry, speech difficulty, weakness, light-headedness, numbness, headaches and paresthesias.  Hematological: Negative.   Psychiatric/Behavioral:  Negative for confusion, hallucinations, sleep disturbance and suicidal ideas.   All other systems reviewed and are negative.      Objective:  BP 111/70   Pulse 83   Temp 98 F (36.7 C)   Ht $R'5\' 7"'hW$  (1.702 m)   Wt 175 lb (79.4 kg)   SpO2 100%   BMI 27.41 kg/m    Wt Readings from Last 3 Encounters:  05/16/21 175 lb (79.4 kg)  04/19/21 173 lb 6.4 oz (78.7 kg)  04/06/21 173 lb 6.4 oz (78.7 kg)    Physical Exam Vitals and nursing note reviewed.  Constitutional:      General: She is not in acute distress.    Appearance: Normal appearance. She is well-developed, well-groomed and overweight. She is not ill-appearing, toxic-appearing or diaphoretic.  HENT:     Head: Normocephalic and atraumatic.     Jaw: There is normal jaw occlusion.     Right Ear: Hearing normal.     Left Ear: Hearing normal.     Nose: Nose normal.     Mouth/Throat:     Lips: Pink.     Mouth: Mucous membranes are moist.     Pharynx: Oropharynx is clear. Uvula midline.  Eyes:     General: Lids are normal.     Extraocular Movements: Extraocular movements intact.     Conjunctiva/sclera: Conjunctivae normal.     Pupils: Pupils are equal, round, and reactive to light.  Neck:     Thyroid: No thyroid mass, thyromegaly or thyroid tenderness.     Vascular: No carotid bruit or JVD.     Trachea: Trachea and phonation normal.  Cardiovascular:     Rate and Rhythm: Normal rate and regular rhythm.     Chest Wall: PMI is not displaced.     Pulses: Normal pulses.     Heart sounds: Normal heart sounds. No murmur heard.   No friction rub. No gallop.  Pulmonary:     Effort: Pulmonary effort is normal. No respiratory distress.     Breath sounds:  Normal breath sounds. No wheezing.  Abdominal:     General: Bowel sounds are normal. There is no distension or abdominal bruit.     Palpations: Abdomen is soft. There is no hepatomegaly or splenomegaly.     Tenderness: There is no abdominal tenderness. There is no right CVA tenderness or left CVA tenderness.     Hernia: No hernia is present.  Musculoskeletal:        General: No swelling, deformity or signs of injury. Normal range of motion.     Cervical back: Normal, normal range of motion and neck supple.     Thoracic back: Normal.     Lumbar back: Tenderness present. No swelling, edema, deformity, signs of trauma, lacerations, spasms or bony  tenderness. Normal range of motion. Negative right straight leg raise test and negative left straight leg raise test. No scoliosis.     Right hip: Normal.     Left hip: Normal.     Right lower leg: No edema.     Left lower leg: No edema.  Lymphadenopathy:     Cervical: No cervical adenopathy.  Skin:    General: Skin is warm and dry.     Capillary Refill: Capillary refill takes less than 2 seconds.     Coloration: Skin is not cyanotic, jaundiced or pale.     Findings: No rash.  Neurological:     General: No focal deficit present.     Mental Status: She is alert and oriented to person, place, and time.     Cranial Nerves: Cranial nerves are intact.     Sensory: Sensation is intact.     Motor: Motor function is intact.     Coordination: Coordination is intact.     Gait: Gait is intact.     Deep Tendon Reflexes: Reflexes are normal and symmetric.  Psychiatric:        Attention and Perception: Attention and perception normal.        Mood and Affect: Mood and affect normal.        Speech: Speech normal.        Behavior: Behavior normal. Behavior is cooperative.        Thought Content: Thought content normal.        Cognition and Memory: Cognition and memory normal.        Judgment: Judgment normal.    Results for orders placed or performed in  visit on 04/19/21  CMP14+EGFR  Result Value Ref Range   Glucose 71 65 - 99 mg/dL   BUN 13 6 - 24 mg/dL   Creatinine, Ser 0.66 0.57 - 1.00 mg/dL   eGFR 105 >59 mL/min/1.73   BUN/Creatinine Ratio 20 9 - 23   Sodium 140 134 - 144 mmol/L   Potassium 4.4 3.5 - 5.2 mmol/L   Chloride 103 96 - 106 mmol/L   CO2 25 20 - 29 mmol/L   Calcium 9.4 8.7 - 10.2 mg/dL   Total Protein 6.6 6.0 - 8.5 g/dL   Albumin 4.3 3.8 - 4.9 g/dL   Globulin, Total 2.3 1.5 - 4.5 g/dL   Albumin/Globulin Ratio 1.9 1.2 - 2.2   Bilirubin Total <0.2 0.0 - 1.2 mg/dL   Alkaline Phosphatase 88 44 - 121 IU/L   AST 17 0 - 40 IU/L   ALT 13 0 - 32 IU/L  CBC with Differential/Platelet  Result Value Ref Range   WBC 9.3 3.4 - 10.8 x10E3/uL   RBC 4.51 3.77 - 5.28 x10E6/uL   Hemoglobin 12.8 11.1 - 15.9 g/dL   Hematocrit 40.0 34.0 - 46.6 %   MCV 89 79 - 97 fL   MCH 28.4 26.6 - 33.0 pg   MCHC 32.0 31.5 - 35.7 g/dL   RDW 12.3 11.7 - 15.4 %   Platelets 386 150 - 450 x10E3/uL   Neutrophils 68 Not Estab. %   Lymphs 19 Not Estab. %   Monocytes 9 Not Estab. %   Eos 3 Not Estab. %   Basos 1 Not Estab. %   Neutrophils Absolute 6.4 1.4 - 7.0 x10E3/uL   Lymphocytes Absolute 1.7 0.7 - 3.1 x10E3/uL   Monocytes Absolute 0.8 0.1 - 0.9 x10E3/uL   EOS (ABSOLUTE) 0.3 0.0 - 0.4 x10E3/uL   Basophils Absolute  0.1 0.0 - 0.2 x10E3/uL   Immature Granulocytes 0 Not Estab. %   Immature Grans (Abs) 0.0 0.0 - 0.1 x10E3/uL  TSH  Result Value Ref Range   TSH 1.800 0.450 - 4.500 uIU/mL     X-Ray: lumbar spine: No acute findings, significant stool burden. Preliminary x-ray reading by Monia Pouch, FNP-C, [facility].   Pertinent labs & imaging results that were available during my care of the patient were reviewed by me and considered in my medical decision making.  Assessment & Plan:  Ayan was seen today for back pain.  Diagnoses and all orders for this visit:  Lumbar back pain No red flags. Imaging without acute findings, did reveal  moderate stool burden. Pain is likely due to stool burden, Miralax clean out discussed in detail. Can take tylenol as needed for pain. Report any new, worsening, or persistent symptoms.  -     DG Lumbar Spine 2-3 Views  Constipation in female Noted on lumbar spine imaging. Likely cause of lower back pain. Miralax clean out discussed in detail.     Continue all other maintenance medications.  Follow up plan: Return if symptoms worsen or fail to improve.   Continue healthy lifestyle choices, including diet (rich in fruits, vegetables, and lean proteins, and low in salt and simple carbohydrates) and exercise (at least 30 minutes of moderate physical activity daily).  Educational handout given for back pain, constipation  The above assessment and management plan was discussed with the patient. The patient verbalized understanding of and has agreed to the management plan. Patient is aware to call the clinic if they develop any new symptoms or if symptoms persist or worsen. Patient is aware when to return to the clinic for a follow-up visit. Patient educated on when it is appropriate to go to the emergency department.   Monia Pouch, FNP-C [facility] [phone]

## 2021-05-25 ENCOUNTER — Ambulatory Visit (INDEPENDENT_AMBULATORY_CARE_PROVIDER_SITE_OTHER): Payer: No Typology Code available for payment source | Admitting: Pharmacist

## 2021-05-25 VITALS — Wt 180.0 lb

## 2021-05-25 DIAGNOSIS — E8881 Metabolic syndrome: Secondary | ICD-10-CM | POA: Diagnosis not present

## 2021-05-25 NOTE — Progress Notes (Signed)
    05/25/2021 Name: Katherine Hays MRN: [id number] DOB: 04/22/1971   S:  29 yoF Presents for weight loss evaluation, education, and management.  She would like to lose weight and interested in pharmalogical management. She is motivated to participate along with diet and exercise.  She is currently 180lbs   Insurance coverage/medication affordability: [employer] employee F[employer] plan    Patient reports adherence with medications. Current medications for weight loss: n/a Has tried OTC things in the past  Has tried phentermine and vyvanse (does not care for stimulants due to anxiety) Has tried ozempic   Patient is active during the day. She walks approx 1 mile a day.  Motivated to work out   She reports she has difficulty with snacking/carbs   Discussed meal planning options and Plate method for healthy eating Avoid sugary drinks and desserts Incorporate balanced protein, non starchy veggies, 1 serving of carbohydrate with each meal Increase water intake Increase physical activity as able   Goal weight is around  155-160lbs per BMI chart    O:   Lipid Panel     Component Value Date/Time   CHOL 185 03/15/2020 0759   TRIG 103 03/15/2020 0759   TRIG 84 06/30/2013 0754   HDL 42 03/15/2020 0759   HDL 46 06/30/2013 0754   CHOLHDL 4.4 03/15/2020 0759   LDLCALC 124 (H) 03/15/2020 0759   LDLCALC 107 (H) 06/30/2013 0754   A/P  -Lipid panel elevated/waist circumference, could benefit from weight loss/GLP1 therapy given signs of metabolic syndrome  -Healthy eating and meal planning discussed   -Increased water and exercise   -Start Mounjaro to 2.5mg  sq weekly (continue to titrate as needed-max dose 15mg ).  Patient denies history of thyroid/medullary cancer.             Work to eat low fat smaller meals to reduce side effects             Decreased carbonated beverages   Written patient instructions provided.  Total time in face to face counseling 25 minutes.   Regina Eck, PharmD, BCPS Clinical Pharmacist, [place]  II Phone [phone]

## 2021-05-29 ENCOUNTER — Other Ambulatory Visit (HOSPITAL_COMMUNITY): Payer: Self-pay

## 2021-05-29 ENCOUNTER — Other Ambulatory Visit: Payer: Self-pay | Admitting: *Deleted

## 2021-05-29 DIAGNOSIS — E039 Hypothyroidism, unspecified: Secondary | ICD-10-CM

## 2021-05-29 MED ORDER — LEVOTHYROXINE SODIUM 50 MCG PO TABS
50.0000 ug | ORAL_TABLET | Freq: Every day | ORAL | 2 refills | Status: DC
  Filled 2021-05-29: qty 90, 90d supply, fill #0
  Filled 2021-11-13: qty 90, 90d supply, fill #1
  Filled 2022-03-05: qty 90, 90d supply, fill #2

## 2021-06-05 ENCOUNTER — Other Ambulatory Visit: Payer: Self-pay | Admitting: Family

## 2021-06-05 DIAGNOSIS — Z1231 Encounter for screening mammogram for malignant neoplasm of breast: Secondary | ICD-10-CM

## 2021-06-19 ENCOUNTER — Telehealth: Payer: Self-pay | Admitting: Pharmacist

## 2021-06-19 MED ORDER — TIRZEPATIDE 5 MG/0.5ML ~~LOC~~ SOAJ: 5.0000 mg | SUBCUTANEOUS | 3 refills | Status: DC

## 2021-06-19 NOTE — Telephone Encounter (Signed)
Increase Mounjaro to 5mg  sq weekly  Tolerating well Denies personal and family history of Medullary thyroid cancer (MTC)

## 2021-06-22 ENCOUNTER — Other Ambulatory Visit: Payer: Self-pay

## 2021-06-22 ENCOUNTER — Telehealth: Payer: No Typology Code available for payment source | Admitting: Family Medicine

## 2021-06-22 ENCOUNTER — Encounter: Payer: Self-pay | Admitting: Family Medicine

## 2021-06-22 ENCOUNTER — Ambulatory Visit (INDEPENDENT_AMBULATORY_CARE_PROVIDER_SITE_OTHER): Payer: No Typology Code available for payment source | Admitting: Family Medicine

## 2021-06-22 DIAGNOSIS — J069 Acute upper respiratory infection, unspecified: Secondary | ICD-10-CM

## 2021-06-22 LAB — VERITOR FLU A/B WAIVED
Influenza A: NEGATIVE
Influenza B: NEGATIVE

## 2021-06-22 NOTE — Progress Notes (Signed)
   Virtual Visit  Note Due to COVID-19 pandemic this visit was conducted virtually. This visit type was conducted due to national recommendations for restrictions regarding the COVID-19 Pandemic (e.g. social distancing, sheltering in place) in an effort to limit this patient's exposure and mitigate transmission in our community. All issues noted in this document were discussed and addressed.  A physical exam was not performed with this format.  I connected with Katherine Hays on 06/22/21 at [place] by telephone and verified that I am speaking with the correct person using two identifiers. Katherine Hays is currently located at home and no one is currently with her during the visit. The provider, Gwenlyn Perking, FNP is located in their office at time of visit.  I discussed the limitations, risks, security and privacy concerns of performing an evaluation and management service by telephone and the availability of in person appointments. I also discussed with the patient that there may be a patient responsible charge related to this service. The patient expressed understanding and agreed to proceed.  CC: fever  History and Present Illness:  HPI [name] reports a fever, congestion, sore throat, and body aches that started yesterday. Her max temperature has been 101.9. She denies chest pain, shortness of breath, nausea, vomiting, or diarrhea. She has been taking Nyquil for her symptoms.     ROS As per HPI.  Observations/Objective: Alert and oriented x 3. Able to speak in full sentences without difficulty.    Assessment and Plan: Thereasa was seen today for fever.  Diagnoses and all orders for this visit:  URI with cough and congestion Labs pending as below, will notify patient of results. Quarantine until [employer]. Rest, hydration, OTC cold and cough medications, nasal saline for congestion, tylenol for fever/pain. Return to office for new or worsening symptoms, or if symptoms  persist.  -     Novel Coronavirus, NAA ([facility]); Future -     Veritor Flu A/B Waived    Follow Up Instructions: As needed.     I discussed the assessment and treatment plan with the patient. The patient was provided an opportunity to ask questions and all were answered. The patient agreed with the plan and demonstrated an understanding of the instructions.   The patient was advised to call back or seek an in-person evaluation if the symptoms worsen or if the condition fails to improve as anticipated.  The above assessment and management plan was discussed with the patient. The patient verbalized understanding of and has agreed to the management plan. Patient is aware to call the clinic if symptoms persist or worsen. Patient is aware when to return to the clinic for a follow-up visit. Patient educated on when it is appropriate to go to the emergency department.   Time call ended:  0831  I provided 6 minutes of  non face-to-face time during this encounter.    Gwenlyn Perking, FNP

## 2021-06-23 LAB — NOVEL CORONAVIRUS, NAA: SARS-CoV-2, NAA: NOT DETECTED

## 2021-06-23 LAB — SARS-COV-2, NAA 2 DAY TAT

## 2021-07-02 ENCOUNTER — Ambulatory Visit: Payer: No Typology Code available for payment source

## 2021-07-03 ENCOUNTER — Other Ambulatory Visit (HOSPITAL_COMMUNITY): Payer: Self-pay

## 2021-07-06 ENCOUNTER — Ambulatory Visit (INDEPENDENT_AMBULATORY_CARE_PROVIDER_SITE_OTHER): Payer: No Typology Code available for payment source | Admitting: *Deleted

## 2021-07-06 ENCOUNTER — Other Ambulatory Visit: Payer: Self-pay

## 2021-07-06 DIAGNOSIS — Z23 Encounter for immunization: Secondary | ICD-10-CM | POA: Diagnosis not present

## 2021-07-11 ENCOUNTER — Other Ambulatory Visit (HOSPITAL_COMMUNITY): Payer: Self-pay

## 2021-07-16 ENCOUNTER — Ambulatory Visit (INDEPENDENT_AMBULATORY_CARE_PROVIDER_SITE_OTHER): Payer: No Typology Code available for payment source | Admitting: Nurse Practitioner

## 2021-07-16 ENCOUNTER — Encounter: Payer: Self-pay | Admitting: Nurse Practitioner

## 2021-07-16 ENCOUNTER — Other Ambulatory Visit (HOSPITAL_COMMUNITY): Payer: Self-pay

## 2021-07-16 VITALS — BP 92/63 | HR 85 | Temp 97.7°F | Resp 20 | Ht 67.0 in | Wt 169.0 lb

## 2021-07-16 DIAGNOSIS — M25512 Pain in left shoulder: Secondary | ICD-10-CM | POA: Diagnosis not present

## 2021-07-16 MED ORDER — METHYLPREDNISOLONE ACETATE 40 MG/ML IJ SUSP
40.0000 mg | Freq: Once | INTRAMUSCULAR | Status: AC
  Administered 2021-07-16: 16:00:00 40 mg via INTRAMUSCULAR

## 2021-07-16 MED ORDER — LIDOCAINE HCL 2 % IJ SOLN
1.0000 mL | Freq: Once | INTRAMUSCULAR | Status: AC
  Administered 2021-07-16: 16:00:00 20 mg via INTRADERMAL

## 2021-07-16 NOTE — Progress Notes (Signed)
   Subjective:    Patient ID: Katherine Hays, female    DOB: 1971/03/07, 50 y.o.   MRN: [id number]   Chief Complaint: Shoulder Pain (Left shoulder)   HPI Patient has been having shoulder pain since February. She had it injected by DR. Dettinger hen she saw ortho and they injected it again back in May. She went to PT one time and they gave her exercises to do at home. She had been doing well until 3 weeks ago she was walking her dog and he jerked hr arm causing her shoulder to hurt again. Rates pain 6/10 when she moves it. If she holds it perfectly still it does not bother her. Denies nay numbness or tingling down arm.     Review of Systems  Constitutional:  Negative for diaphoresis.  Eyes:  Negative for pain.  Respiratory:  Negative for shortness of breath.   Cardiovascular:  Negative for chest pain, palpitations and leg swelling.  Gastrointestinal:  Negative for abdominal pain.  Endocrine: Negative for polydipsia.  Musculoskeletal:  Positive for arthralgias (left shoulder).  Skin:  Negative for rash.  Neurological:  Negative for dizziness, weakness and headaches.  Hematological:  Does not bruise/bleed easily.  All other systems reviewed and are negative.     Objective:   Physical Exam Vitals reviewed.  Constitutional:      Appearance: Normal appearance.  Cardiovascular:     Rate and Rhythm: Normal rate and regular rhythm.     Heart sounds: Normal heart sounds.  Pulmonary:     Effort: Pulmonary effort is normal.     Breath sounds: Normal breath sounds.  Musculoskeletal:     Comments: FROM of left shoulder with pain on internal rotation. Grips equal bil Motor strength and sensation distally intact   Skin:    General: Skin is warm.  Neurological:     General: No focal deficit present.     Mental Status: She is alert and oriented to person, place, and time.    Joint Injection/Arthrocentesis  Date/Time: 07/16/2021 3:44 PM Performed by: Chevis Pretty,  FNP Authorized by: Hassell Done Mary-Margaret, FNP  Indications: pain  Body area: shoulder Joint: left shoulder Local anesthesia used: no  Anesthesia: Local anesthesia used: no  Sedation: Patient sedated: no  Needle size: 22 G Ultrasound guidance: no Approach: posterior Methylprednisolone amount: 40 mg Lidocaine 2% amount: 1 mL Patient tolerance: patient tolerated the procedure well with no immediate complications         Assessment & Plan:  Katherine Hays in today with chief complaint of Shoulder Pain (Left shoulder)   1. Acute pain of left shoulder Ice Rest RTO prn - lidocaine (XYLOCAINE) 2 % (with pres) injection 20 mg - methylPREDNISolone acetate (DEPO-MEDROL) injection 40 mg    The above assessment and management plan was discussed with the patient. The patient verbalized understanding of and has agreed to the management plan. Patient is aware to call the clinic if symptoms persist or worsen. Patient is aware when to return to the clinic for a follow-up visit. Patient educated on when it is appropriate to go to the emergency department.   Mary-Margaret Hassell Done, FNP

## 2021-07-16 NOTE — Patient Instructions (Signed)
Joint Steroid Injection A joint steroid injection is a procedure to relieve swelling and pain in a joint. Steroids are medicines that reduce inflammation. In this procedure, your health care provider uses a syringe and a needle to inject a steroid medicine into a painful and inflamed joint. A pain-relieving medicine (anesthetic) may be injected along with the steroid. In some cases, your health care provider may use an imaging technique such as ultrasound or fluoroscopy toguide the injection. Joints that are often treated with steroid injections include the knee, shoulder, hip, and spine. These injections may also be used in the elbow, ankle, and joints of the hands or feet. You may have joint steroid injections as part of your treatment for inflammation caused by: Gout. Rheumatoid arthritis. Advanced wear-and-tear arthritis (osteoarthritis). Tendinitis. Bursitis. Joint steroid injections may be repeated, but having them too often can damage a joint or the skin over the joint. You should not have joint steroidinjections less than 6 weeks apart or more than four times a year. Tell a health care provider about: Any allergies you have. All medicines you are taking, including vitamins, herbs, eye drops, creams, and over-the-counter medicines. Any problems you or family members have had with anesthetic medicines. Any blood disorders you have. Any surgeries you have had. Any medical conditions you have. Whether you are pregnant or may be pregnant. What are the risks? Generally, this is a safe treatment. However, problems may occur, including: Infection. Bleeding. Allergic reactions to medicines. Damage to the joint or tissues around the joint. Thinning of skin or loss of skin color over the joint. Temporary flushing of the face or chest. Temporary increase in pain. Temporary increase in blood sugar. Failure to relieve inflammation or pain. What happens before the treatment? Medicines Ask  your health care provider about: Changing or stopping your regular medicines. This is especially important if you are taking diabetes medicines or blood thinners. Taking medicines such as aspirin and ibuprofen. These medicines can thin your blood. Do not take these medicines unless your health care provider tells you to take them. Taking over-the-counter medicines, vitamins, herbs, and supplements. General instructions You may have imaging tests of your joint. Ask your health care provider if you can drive yourself home after the procedure. What happens during the treatment?  Your health care provider will position you for the injection and locate the injection site over your joint. The skin over the joint will be cleaned with a germ-killing soap. Your health care provider may: Spray a numbing solution (topical anesthetic) over the injection site. Inject a local anesthetic under the skin above your joint. The needle will be placed through your skin into your joint. Your health care provider may use imaging to guide the needle to the right spot for the injection. If imaging is used, a special contrast dye may be injected to confirm that the needle is in the correct location. The steroid medicine will be injected into your joint. Anesthetic may be injected along with the steroid. This may be a medicine that relieves pain for a short time (short-acting anesthetic) or for a longer time (long-acting anesthetic). The needle will be removed, and an adhesive bandage (dressing) will be placed over the injection site. The procedure may vary among health care providers and hospitals. What can I expect after the treatment? You will be able to go home after the treatment. It is normal to feel slight flushing for a few days after the injection. After the treatment, it is common to   have an increase in joint pain after the anesthetic has worn off. This may happen about an hour after a short-acting anesthetic  or about 8 hours after a longer-acting anesthetic. You should begin to feel relief from joint pain and swelling after 24 to 48 hours. Contact your health care provider if you do not begin to feel relief after 2 days. Follow these instructions at home: Injection site care Leave the adhesive dressing over your injection site in place until your health care provider says you can remove it. Check your injection site every day for signs of infection. Check for: More redness, swelling, or pain. Fluid or blood. Warmth. Pus or a bad smell. Activity Return to your normal activities as told by your health care provider. Ask your health care provider what activities are safe for you. You may be asked to limit activities that put stress on the joint for a few days. Do joint exercises as told by your health care provider. Do not take baths, swim, or use a hot tub until your health care provider approves. Ask your health care provider if you may take showers. You may only be allowed to take sponge baths. Managing pain, stiffness, and swelling  If directed, put ice on the joint. To do this: Put ice in a plastic bag. Place a towel between your skin and the bag. Leave the ice on for 20 minutes, 2-3 times a day. Remove the ice if your skin turns bright red. This is very important. If you cannot feel pain, heat, or cold, you have a greater risk of damage to the area. Raise (elevate) your joint above the level of your heart when you are sitting or lying down.  General instructions Take over-the-counter and prescription medicines only as told by your health care provider. Do not use any products that contain nicotine or tobacco, such as cigarettes, e-cigarettes, and chewing tobacco. These can delay joint healing. If you need help quitting, ask your health care provider. If you have diabetes, be aware that your blood sugar may be slightly elevated for several days after the injection. Keep all follow-up visits.  This is important. Contact a health care provider if you have: Chills or a fever. Any signs of infection at your injection site. Increased pain or swelling or no relief after 2 days. Summary A joint steroid injection is a treatment to relieve pain and swelling in a joint. Steroids are medicines that reduce inflammation. Your health care provider may add an anesthetic along with the steroid. You may have joint steroid injections as part of your arthritis treatment. Joint steroid injections may be repeated, but having them too often can damage a joint or the skin over the joint. Contact your health care provider if you have a fever, chills, or signs of infection, or if you get no relief from joint pain or swelling. This information is not intended to replace advice given to you by your health care provider. Make sure you discuss any questions you have with your healthcare provider. Document Revised: 01/14/2020 Document Reviewed: 01/14/2020 Elsevier Patient Education  2022 [employer]  

## 2021-07-23 ENCOUNTER — Other Ambulatory Visit (HOSPITAL_COMMUNITY): Payer: Self-pay

## 2021-07-27 ENCOUNTER — Other Ambulatory Visit: Payer: Self-pay

## 2021-07-27 DIAGNOSIS — M25512 Pain in left shoulder: Secondary | ICD-10-CM

## 2021-08-01 ENCOUNTER — Ambulatory Visit: Payer: No Typology Code available for payment source | Attending: Nurse Practitioner

## 2021-08-01 ENCOUNTER — Other Ambulatory Visit: Payer: Self-pay

## 2021-08-01 DIAGNOSIS — M25612 Stiffness of left shoulder, not elsewhere classified: Secondary | ICD-10-CM | POA: Diagnosis present

## 2021-08-01 DIAGNOSIS — M25512 Pain in left shoulder: Secondary | ICD-10-CM | POA: Diagnosis not present

## 2021-08-01 DIAGNOSIS — G8929 Other chronic pain: Secondary | ICD-10-CM

## 2021-08-01 NOTE — Therapy (Signed)
[facility] [place], [place], [address] Phone: [phone]   Fax:  [phone]  Physical Therapy Evaluation  Patient Details  Name: Katherine Hays MRN: [id number] Date of Birth: Aug 03, 1971 Referring Provider (PT): Waymond Cera Date: 08/01/2021   PT End of Session - 08/01/21 0906     Visit Number 1    Number of Visits 10    Date for PT Re-Evaluation 10/26/21    PT Start Time 0907    PT Stop Time 0958    PT Time Calculation (min) 51 min    Activity Tolerance Patient tolerated treatment well    Behavior During Therapy [facility] for tasks assessed/performed             Past Medical History:  Diagnosis Date   Anxiety    Asthma    OCD (obsessive compulsive disorder)     Past Surgical History:  Procedure Laterality Date   TONSILLECTOMY     TUBAL LIGATION     WISDOM TOOTH EXTRACTION      There were no vitals filed for this visit.    Subjective Assessment - 08/01/21 0907     Subjective Patient reports that she fell on her left shoulder about six months ago. She had recieved 3 injections since the fall that caused her initial injury. The first two shots helped, but this most recent injection has not been effective. She notes that her pain is worse with arm movement and at night when she tries to go to sleep.    Pertinent History Hypothyroidism    Limitations Lifting;House hold activities    Patient Stated Goals reduced pain, carry laundry    Currently in Pain? Yes    Pain Score 6     Pain Location Shoulder    Pain Orientation Left    Pain Descriptors / Indicators Throbbing;Sharp    Pain Type Chronic pain    Pain Onset More than a month ago    Pain Frequency Intermittent    Aggravating Factors  arm movement, sleeping at night    Pain Relieving Factors medication, heat    Effect of Pain on Daily Activities slows her down, but she is able to continue to use her left arm                [facility] PT  Assessment - 08/01/21 0001       Assessment   Medical Diagnosis Acute left shoulder pain    Referring Provider (PT) Hassell Done    Onset Date/Surgical Date --   6 months, but it got worse in the past month   Hand Dominance Right    Next MD Visit None    Prior Therapy Yes      Precautions   Precautions None      Restrictions   Weight Bearing Restrictions No      Balance Screen   Has the patient fallen in the past 6 months Yes    How many times? 2   #1: tripped over a cord and felt a pop in her left shoulder #2: tripped and hit her head   Has the patient had a decrease in activity level because of a fear of falling?  No    Is the patient reluctant to leave their home because of a fear of falling?  No      Home [employer] residence      Prior Function   Level of Independence Independent  Vocation Full time employment      Cognition   Overall Cognitive Status Within Functional Limits for tasks assessed    Attention Focused    Focused Attention Appears intact    Memory Appears intact    Awareness Appears intact    Problem Solving Appears intact      Sensation   Additional Comments Patient reports no numbness or tingling      ROM / Strength   AROM / PROM / Strength AROM;Strength;PROM      AROM   AROM Assessment Site Shoulder    Right/Left Shoulder Right;Left    Right Shoulder Flexion --   WFL   Right Shoulder ABduction --   [facility]   Right Shoulder Internal Rotation --   T6   Right Shoulder External Rotation --   T6   Left Shoulder Flexion 107 Degrees   limited by pain   Left Shoulder ABduction 118 Degrees   familiar pain   Left Shoulder Internal Rotation --   T11; familiar pain   Left Shoulder External Rotation --   Spine of scapula; familiar pain     PROM   PROM Assessment Site Shoulder    Right/Left Shoulder Left    Left Shoulder Flexion 114 Degrees   limited by pain   Left Shoulder ABduction --   limited by pain     Strength    Overall Strength Unable to assess   due to pain irritability     Palpation   Palpation comment TTP: left upper trapezius, supraspinatus, infraspinatus, long head of biceps   Glenohumeral joint mobility: hypomobile and painful                       Objective measurements completed on examination: See above findings.       [facility] Adult PT Treatment/Exercise - 08/01/21 0001       Modalities   Modalities Vasopneumatic      Vasopneumatic   Number Minutes Vasopneumatic  15 minutes    Vasopnuematic Location  Shoulder    Vasopneumatic Pressure Low    Vasopneumatic Temperature  34      Manual Therapy   Manual Therapy Joint mobilization;Passive ROM;Soft tissue mobilization    Joint Mobilization Glenohumeral inferior and posterior grade I-II    Soft tissue mobilization left RTC    Passive ROM all planes to tolerance                          PT Long Term Goals - 08/01/21 1020       PT LONG TERM GOAL #1   Title Patient will be independent with her HEP.    Time 5    Period Weeks    Status New    Target Date 09/05/21      PT LONG TERM GOAL #2   Title Patient will be able to demonstrate at least 125 degrees of left shoulder flexion.    Time 5    Period Weeks    Status New    Target Date 09/05/21      PT LONG TERM GOAL #3   Title Patient will be able to carry at least 8 pounds with her left upper extremity without being limited by her left shoulder for improved function carrying her laundry.    Time 5    Period Weeks    Status New    Target Date 09/05/21  Plan - 08/01/21 1012     Clinical Impression Statement Patient is a 50 year old female presenting to physical therapy with a recent exacerbation in her chronic left shoulder pain. She presented today with moderate left shoulder pain severity and irritability with active and passive ROM exacerbating her familiar symptoms. Her strength was unable to be accurately  assessed due to increased pain following her AROM assessment. Recommend that she continue with her plan of care to address her remaining impairments to return to her prior level of function.    Personal Factors and Comorbidities Time since onset of injury/illness/exacerbation    Examination-Activity Limitations Reach Overhead;Carry;Lift    Examination-Participation Restrictions Cleaning;Other    Stability/Clinical Decision Making Evolving/Moderate complexity    Clinical Decision Making Moderate    Rehab Potential Good    PT Frequency 2x / week    PT Duration Other (comment)   5 weeks   PT Treatment/Interventions Electrical Stimulation;Cryotherapy;Iontophoresis 4mg /ml Dexamethasone;Moist Heat;Neuromuscular re-education;Therapeutic exercise;Therapeutic activities;Patient/family education;Manual techniques;Dry needling;Passive range of motion;Taping;Vasopneumatic Device    PT Next Visit Plan UBE, AAROM, shoulder strengthening and stability    Consulted and Agree with Plan of Care Patient             Patient will benefit from skilled therapeutic intervention in order to improve the following deficits and impairments:  Decreased range of motion, Impaired UE functional use, Decreased activity tolerance, Pain, Hypomobility, Decreased strength  Visit Diagnosis: Chronic left shoulder pain  Stiffness of left shoulder, not elsewhere classified     Problem List Patient Active Problem List   Diagnosis Date Noted   Low hemoglobin 11/15/2020   Controlled substance agreement signed 02/04/2019   Benzodiazepine dependence ([place]) 02/04/2019   Gastroesophageal reflux disease 02/04/2019   Vitamin D deficiency 11/13/2017   Hypothyroidism 11/13/2017   Hyperlipemia 11/13/2017   Insomnia 11/10/2017   History of OCD (obsessive compulsive disorder) 01/12/2014   GAD (generalized anxiety disorder) 01/12/2014   Inguinal lymphadenopathy 05/26/2012    Darlin Coco, PT 08/01/2021, 10:26 AM  [facility] [address] [place], [place], [address] Phone: [phone]   Fax:  [phone]  Name: Katherine Hays MRN: [id number] Date of Birth: [birth year]

## 2021-08-07 ENCOUNTER — Ambulatory Visit: Payer: No Typology Code available for payment source

## 2021-08-07 ENCOUNTER — Other Ambulatory Visit (HOSPITAL_COMMUNITY): Payer: Self-pay

## 2021-08-07 ENCOUNTER — Telehealth: Payer: Self-pay | Admitting: Pharmacist

## 2021-08-07 MED ORDER — OZEMPIC (0.25 OR 0.5 MG/DOSE) 2 MG/1.5ML ~~LOC~~ SOPN
0.5000 mg | PEN_INJECTOR | SUBCUTANEOUS | 3 refills | Status: DC
  Filled 2021-08-07: qty 1.5, 28d supply, fill #0

## 2021-08-07 NOTE — Telephone Encounter (Signed)
Mounjaro not available Will substitute ozempic

## 2021-08-09 ENCOUNTER — Ambulatory Visit: Payer: No Typology Code available for payment source

## 2021-08-09 ENCOUNTER — Other Ambulatory Visit: Payer: Self-pay

## 2021-08-09 DIAGNOSIS — M25612 Stiffness of left shoulder, not elsewhere classified: Secondary | ICD-10-CM

## 2021-08-09 DIAGNOSIS — M25512 Pain in left shoulder: Secondary | ICD-10-CM | POA: Diagnosis not present

## 2021-08-09 NOTE — Therapy (Signed)
[facility] [place], [place], [address] Phone: [phone]   Fax:  [phone]  Physical Therapy Treatment  Patient Details  Name: Katherine Hays MRN: [id number] Date of Birth: Mar 25, 1971 Referring Provider (PT): Waymond Cera Date: 08/09/2021   PT End of Session - 08/09/21 1607     Visit Number 2    Number of Visits 10    Date for PT Re-Evaluation 10/26/21    PT Start Time 1603    PT Stop Time 1650    PT Time Calculation (min) 47 min    Activity Tolerance Patient tolerated treatment well    Behavior During Therapy [facility] for tasks assessed/performed             Past Medical History:  Diagnosis Date   Anxiety    Asthma    OCD (obsessive compulsive disorder)     Past Surgical History:  Procedure Laterality Date   TONSILLECTOMY     TUBAL LIGATION     WISDOM TOOTH EXTRACTION      There were no vitals filed for this visit.   Subjective Assessment - 08/09/21 1603     Subjective Patient reports that her shoulder is throbbing a little today. She notes that it was throbbing quite a bit after her last appointment. She notes that trying to back her car yesterday caused her pain to get up to a 10/10.    Pertinent History Hypothyroidism    Limitations Lifting;House hold activities    Patient Stated Goals reduced pain, carry laundry    Currently in Pain? Yes    Pain Score 2     Pain Location Shoulder    Pain Orientation Right    Pain Type Chronic pain    Pain Onset More than a month ago                               [facility] Adult PT Treatment/Exercise - 08/09/21 0001       Exercises   Exercises Shoulder      Shoulder Exercises: Standing   Row Strengthening;Both;20 reps    Row Weight (lbs) Blue XTS      Shoulder Exercises: Pulleys   Flexion 5 minutes      Shoulder Exercises: ROM/Strengthening   [place] standing; overhead arc   2 minutes     Shoulder Exercises: Isometric  Strengthening   Extension Supine;Other (comment)   5 second hold, 15 reps     Modalities   Modalities Vasopneumatic      Vasopneumatic   Number Minutes Vasopneumatic  15 minutes    Vasopnuematic Location  Shoulder    Vasopneumatic Pressure Low    Vasopneumatic Temperature  34      Manual Therapy   Manual Therapy Joint mobilization;Soft tissue mobilization    Joint Mobilization Glenohumeral inferior and posterior grade I-II    Soft tissue mobilization left RTC with pin and stretch to biceps                          PT Long Term Goals - 08/01/21 1020       PT LONG TERM GOAL #1   Title Patient will be independent with her HEP.    Time 5    Period Weeks    Status New    Target Date 09/05/21      PT LONG TERM GOAL #2  Title Patient will be able to demonstrate at least 125 degrees of left shoulder flexion.    Time 5    Period Weeks    Status New    Target Date 09/05/21      PT LONG TERM GOAL #3   Title Patient will be able to carry at least 8 pounds with her left upper extremity without being limited by her left shoulder for improved function carrying her laundry.    Time 5    Period Weeks    Status New    Target Date 09/05/21                   Plan - 08/09/21 1608     Clinical Impression Statement Patient was introduced to multiple new interventions for improved shoulder mobility and strength with moderate difficulty. She required minimal cuing with supine shoulder extension for proper biomechanics to facilitate rotator cuff engagement. Manual therapy focused improved shoulder mobility through the use of soft tissue mobilization to the biceps and surrounding musculature. She reported that her shoulder felt a little better upon the conclusion of treatment. She continues to require skilled physical therapy to address her remaining impairments to return to her prior level of function.    Personal Factors and Comorbidities Time since onset of  injury/illness/exacerbation    Examination-Activity Limitations Reach Overhead;Carry;Lift    Examination-Participation Restrictions Cleaning;Other    Stability/Clinical Decision Making Evolving/Moderate complexity    Rehab Potential Good    PT Frequency 2x / week    PT Duration Other (comment)   5 weeks   PT Treatment/Interventions Electrical Stimulation;Cryotherapy;Iontophoresis 4mg /ml Dexamethasone;Moist Heat;Neuromuscular re-education;Therapeutic exercise;Therapeutic activities;Patient/family education;Manual techniques;Dry needling;Passive range of motion;Taping;Vasopneumatic Device    PT Next Visit Plan UBE, AAROM, shoulder strengthening and stability    Consulted and Agree with Plan of Care Patient             Patient will benefit from skilled therapeutic intervention in order to improve the following deficits and impairments:  Decreased range of motion, Impaired UE functional use, Decreased activity tolerance, Pain, Hypomobility, Decreased strength  Visit Diagnosis: Chronic left shoulder pain  Stiffness of left shoulder, not elsewhere classified     Problem List Patient Active Problem List   Diagnosis Date Noted   Low hemoglobin 11/15/2020   Controlled substance agreement signed 02/04/2019   Benzodiazepine dependence ([place]) 02/04/2019   Gastroesophageal reflux disease 02/04/2019   Vitamin D deficiency 11/13/2017   Hypothyroidism 11/13/2017   Hyperlipemia 11/13/2017   Insomnia 11/10/2017   History of OCD (obsessive compulsive disorder) 01/12/2014   GAD (generalized anxiety disorder) 01/12/2014   Inguinal lymphadenopathy 05/26/2012    Darlin Coco, PT 08/09/2021, 4:54 PM  [facility] [address] [place], [place], [address] Phone: [phone]   Fax:  [phone]  Name: Katherine Hays MRN: [id number] Date of Birth: May 08, 1971

## 2021-08-16 ENCOUNTER — Other Ambulatory Visit: Payer: Self-pay

## 2021-08-16 ENCOUNTER — Ambulatory Visit: Payer: No Typology Code available for payment source | Admitting: *Deleted

## 2021-08-16 DIAGNOSIS — M25512 Pain in left shoulder: Secondary | ICD-10-CM

## 2021-08-16 DIAGNOSIS — G8929 Other chronic pain: Secondary | ICD-10-CM

## 2021-08-16 DIAGNOSIS — M25612 Stiffness of left shoulder, not elsewhere classified: Secondary | ICD-10-CM

## 2021-08-16 NOTE — Therapy (Signed)
[facility] [place], [place], [address] Phone: [phone]   Fax:  [phone]  Physical Therapy Treatment  Patient Details  Name: Katherine Hays MRN: [id number] Date of Birth: 02-Aug-1971 Referring Provider (PT): Waymond Cera Date: 08/16/2021   PT End of Session - 08/16/21 0919     Visit Number 3    Number of Visits 10    Date for PT Re-Evaluation 10/26/21    PT Start Time 0815    PT Stop Time 0910    PT Time Calculation (min) 55 min             Past Medical History:  Diagnosis Date   Anxiety    Asthma    OCD (obsessive compulsive disorder)     Past Surgical History:  Procedure Laterality Date   TONSILLECTOMY     TUBAL LIGATION     WISDOM TOOTH EXTRACTION      There were no vitals filed for this visit.   Subjective Assessment - 08/16/21 0901     Subjective Patient reports that her shoulder is throbbing a little today. She notes that trying to back her car yesterday caused her pain to get up to a 10/10. Any reaching up and back    Pertinent History Hypothyroidism    Limitations Lifting;House hold activities    Patient Stated Goals reduced pain, carry laundry                               [facility] Adult PT Treatment/Exercise - 08/16/21 0001       Exercises   Exercises Shoulder      Shoulder Exercises: ROM/Strengthening   UBE (Upper Arm Bike) 120 RPMs x 8 mins      Modalities   Modalities Vasopneumatic;Electrical Stimulation      Electrical Stimulation   Electrical Stimulation Location LT shldr    Electrical Stimulation Action microcurrent    Electrical Stimulation Parameters 124/77 and 13/77 x 15 mins each    Electrical Stimulation Goals Pain      Vasopneumatic   Number Minutes Vasopneumatic  10 minutes    Vasopnuematic Location  Shoulder    Vasopneumatic Pressure Low    Vasopneumatic Temperature  34      Manual Therapy   Soft tissue mobilization x-friction  mass to LT ACJ    Passive ROM PROM to LT shldr for IR, ER, and elevation with microcurrent on x 20 mins                          PT Long Term Goals - 08/01/21 1020       PT LONG TERM GOAL #1   Title Patient will be independent with her HEP.    Time 5    Period Weeks    Status New    Target Date 09/05/21      PT LONG TERM GOAL #2   Title Patient will be able to demonstrate at least 125 degrees of left shoulder flexion.    Time 5    Period Weeks    Status New    Target Date 09/05/21      PT LONG TERM GOAL #3   Title Patient will be able to carry at least 8 pounds with her left upper extremity without being limited by her left shoulder for improved function carrying her laundry.    Time 5  Period Weeks    Status New    Target Date 09/05/21                   Plan - 08/16/21 8101     Clinical Impression Statement Pt arrived today doing fair with LT shldr, but still with pain with certain motions. Rx focused on increasing ROM and decreasing pain LT shldr. End of session with Estim and Vaso tolerated well.    Personal Factors and Comorbidities Time since onset of injury/illness/exacerbation    Examination-Activity Limitations Reach Overhead;Carry;Lift    Examination-Participation Restrictions Cleaning;Other    Stability/Clinical Decision Making Evolving/Moderate complexity    Rehab Potential Good    PT Frequency 2x / week    PT Treatment/Interventions Electrical Stimulation;Cryotherapy;Iontophoresis 4mg /ml Dexamethasone;Moist Heat;Neuromuscular re-education;Therapeutic exercise;Therapeutic activities;Patient/family education;Manual techniques;Dry needling;Passive range of motion;Taping;Vasopneumatic Device    PT Next Visit Plan UBE, AAROM, shoulder strengthening and stability             Patient will benefit from skilled therapeutic intervention in order to improve the following deficits and impairments:  Decreased range of motion, Impaired UE  functional use, Decreased activity tolerance, Pain, Hypomobility, Decreased strength  Visit Diagnosis: Chronic left shoulder pain  Stiffness of left shoulder, not elsewhere classified  Acute pain of left shoulder     Problem List Patient Active Problem List   Diagnosis Date Noted   Low hemoglobin 11/15/2020   Controlled substance agreement signed 02/04/2019   Benzodiazepine dependence ([place]) 02/04/2019   Gastroesophageal reflux disease 02/04/2019   Vitamin D deficiency 11/13/2017   Hypothyroidism 11/13/2017   Hyperlipemia 11/13/2017   Insomnia 11/10/2017   History of OCD (obsessive compulsive disorder) 01/12/2014   GAD (generalized anxiety disorder) 01/12/2014   Inguinal lymphadenopathy 05/26/2012    Katherine Hays,CHRIS, PTA 08/16/2021, 1:20 PM  [facility] [address] [place], [place], [address] Phone: [phone]   Fax:  [phone]  Name: Katherine Hays MRN: [id number] Date of Birth: 05/12/1971

## 2021-08-23 ENCOUNTER — Other Ambulatory Visit: Payer: Self-pay

## 2021-08-23 ENCOUNTER — Ambulatory Visit: Payer: No Typology Code available for payment source | Attending: Nurse Practitioner | Admitting: *Deleted

## 2021-08-23 DIAGNOSIS — M25512 Pain in left shoulder: Secondary | ICD-10-CM | POA: Diagnosis not present

## 2021-08-23 DIAGNOSIS — G8929 Other chronic pain: Secondary | ICD-10-CM | POA: Diagnosis present

## 2021-08-23 DIAGNOSIS — M25612 Stiffness of left shoulder, not elsewhere classified: Secondary | ICD-10-CM | POA: Diagnosis present

## 2021-08-23 NOTE — Therapy (Addendum)
[facility] [place], [place], [address] Phone: [phone]   Fax:  [phone]  Physical Therapy Treatment  Patient Details  Name: Katherine Hays MRN: [id number] Date of Birth: 1971/07/18 Referring Provider (PT): Waymond Cera Date: 08/23/2021   PT End of Session - 08/23/21 1717     Visit Number 4    Number of Visits 10    Date for PT Re-Evaluation 10/26/21    PT Start Time 5956    PT Stop Time 1724    PT Time Calculation (min) 46 min             Past Medical History:  Diagnosis Date   Anxiety    Asthma    OCD (obsessive compulsive disorder)     Past Surgical History:  Procedure Laterality Date   TONSILLECTOMY     TUBAL LIGATION     WISDOM TOOTH EXTRACTION      There were no vitals filed for this visit.   Subjective Assessment - 08/23/21 1714     Subjective Patient reports that her shoulder is throbbing a little today again.Appt wit MD next Thursday. Not getting any better.    Pertinent History Hypothyroidism    Limitations Lifting;House hold activities    Patient Stated Goals reduced pain, carry laundry    Currently in Pain? Yes    Pain Score 4     Pain Location Shoulder    Pain Orientation Right    Pain Descriptors / Indicators Aching;Sore;Throbbing    Pain Type Chronic pain    Pain Onset More than a month ago                               [facility] Adult PT Treatment/Exercise - 08/23/21 0001       Exercises   Exercises Shoulder      Modalities   Modalities Vasopneumatic;Electrical Stimulation;Ultrasound      Acupuncturist Location LT shldr    Electrical Stimulation Action microcurrent    Electrical Stimulation Parameters 40/141x 20 mins    Electrical Stimulation Goals Pain      Ultrasound   Ultrasound Location LT shldr    Ultrasound Parameters 1.5 w/cm2 x 10 mins Combo    Ultrasound Goals Pain      Vasopneumatic    Number Minutes Vasopneumatic  15 minutes    Vasopnuematic Location  Shoulder    Vasopneumatic Pressure Low    Vasopneumatic Temperature  34      Manual Therapy   Soft tissue mobilization x-friction mass to LT ACJ, TPR to poterior cuff                          PT Long Term Goals - 08/01/21 1020       PT LONG TERM GOAL #1   Title Patient will be independent with her HEP.    Time 5    Period Weeks    Status New    Target Date 09/05/21      PT LONG TERM GOAL #2   Title Patient will be able to demonstrate at least 125 degrees of left shoulder flexion.    Time 5    Period Weeks    Status New    Target Date 09/05/21      PT LONG TERM GOAL #3   Title Patient will be able to carry at  least 8 pounds with her left upper extremity without being limited by her left shoulder for improved function carrying her laundry.    Time 5    Period Weeks    Status New    Target Date 09/05/21                   Plan - 08/23/21 1724     Clinical Impression Statement Pt arrived today doing fair, but throbbing/ aching pain in LT shldr. Very tender to palpation distal acromion area and with TP's through out posterior cuff. Rx focused on decreasing pain LT shldr.    Personal Factors and Comorbidities Time since onset of injury/illness/exacerbation    Examination-Activity Limitations Reach Overhead;Carry;Lift    Stability/Clinical Decision Making Evolving/Moderate complexity    Rehab Potential Good    PT Frequency 2x / week    PT Duration Other (comment)    PT Treatment/Interventions Electrical Stimulation;Cryotherapy;Iontophoresis 40m/ml Dexamethasone;Moist Heat;Neuromuscular re-education;Therapeutic exercise;Therapeutic activities;Patient/family education;Manual techniques;Dry needling;Passive range of motion;Taping;Vasopneumatic Device    PT Next Visit Plan Pt to f/u with MD next week    Consulted and Agree with Plan of Care Patient             Patient will benefit  from skilled therapeutic intervention in order to improve the following deficits and impairments:  Decreased range of motion, Impaired UE functional use, Decreased activity tolerance, Pain, Hypomobility, Decreased strength  Visit Diagnosis: Chronic left shoulder pain  Stiffness of left shoulder, not elsewhere classified  Acute pain of left shoulder     Problem List Patient Active Problem List   Diagnosis Date Noted   Low hemoglobin 11/15/2020   Controlled substance agreement signed 02/04/2019   [place] 02/04/2019   Gastroesophageal reflux disease 02/04/2019   Vitamin D deficiency 11/13/2017   Hypothyroidism 11/13/2017   Hyperlipemia 11/13/2017   Insomnia 11/10/2017   History of OCD (obsessive compulsive disorder) 01/12/2014   GAD (generalized anxiety disorder) 01/12/2014   Inguinal lymphadenopathy 05/26/2012    Chancey Cullinane,CHRIS, PTA 08/23/2021, 5:50 PM  [facility] [address] [place] [address]Phone: [phone]  Fax:  [phone] Name: Katherine SEMPLEMRN: [id number] of Birth: [birth year] PHYSICAL THERAPY DISCHARGE SUMMARY  Visits from Start of Care: 4  Current functional level related to goals / functional outcomes: Patient was not able to meet her goals for physical therapy.    Remaining deficits: Left shoulder pain, and stiffness    Education / Equipment: HEP   Patient agrees to discharge. Patient goals were not met. Patient is being discharged due to not returning since the last visit.   JJacqulynn Cadet PT, DPT

## 2021-08-27 ENCOUNTER — Other Ambulatory Visit: Payer: Self-pay | Admitting: Family

## 2021-08-27 ENCOUNTER — Encounter: Payer: Self-pay | Admitting: Nurse Practitioner

## 2021-08-27 ENCOUNTER — Ambulatory Visit (INDEPENDENT_AMBULATORY_CARE_PROVIDER_SITE_OTHER): Payer: No Typology Code available for payment source | Admitting: Nurse Practitioner

## 2021-08-27 DIAGNOSIS — R399 Unspecified symptoms and signs involving the genitourinary system: Secondary | ICD-10-CM

## 2021-08-27 LAB — MICROSCOPIC EXAMINATION: Renal Epithel, UA: NONE SEEN /hpf

## 2021-08-27 LAB — URINALYSIS, COMPLETE
Bilirubin, UA: NEGATIVE
Glucose, UA: NEGATIVE
Ketones, UA: NEGATIVE
Nitrite, UA: NEGATIVE
Protein,UA: NEGATIVE
Specific Gravity, UA: >1.03 — ABNORMAL HIGH (ref 1.005–1.030)
Urobilinogen, Ur: 0.2 mg/dL (ref 0.2–1.0)
pH, UA: 5.5 (ref 5.0–7.5)

## 2021-08-27 MED ORDER — PHENAZOPYRIDINE HCL 95 MG PO TABS: 95.0000 mg | ORAL_TABLET | Freq: Three times a day (TID) | ORAL | 0 refills | Status: DC | PRN

## 2021-08-27 MED ORDER — CEPHALEXIN 500 MG PO CAPS: 500.0000 mg | ORAL_CAPSULE | Freq: Two times a day (BID) | ORAL | 0 refills | Status: DC

## 2021-08-27 NOTE — Progress Notes (Signed)
° °  Virtual Visit  Note Due to COVID-19 pandemic this visit was conducted virtually. This visit type was conducted due to national recommendations for restrictions regarding the COVID-19 Pandemic (e.g. social distancing, sheltering in place) in an effort to limit this patient's exposure and mitigate transmission in our community. All issues noted in this document were discussed and addressed.  A physical exam was not performed with this format.  I connected with Katherine Hays on 08/27/21 at 1:25 pm by telephone and verified that I am speaking with the correct person using two identifiers. Katherine Hays is currently located at work  during visit. The provider, Ivy Lynn, NP is located in their office at time of visit.  I discussed the limitations, risks, security and privacy concerns of performing an evaluation and management service by telephone and the availability of in person appointments. I also discussed with the patient that there may be a patient responsible charge related to this service. The patient expressed understanding and agreed to proceed.   History and Present Illness:  Dysuria  This is a new problem. The current episode started yesterday. The problem occurs every urination. The problem has been unchanged. The quality of the pain is described as aching. The pain is at a severity of 3/10. The pain is mild. There has been no fever. Associated symptoms include flank pain and hesitancy. Pertinent negatives include no chills or possible pregnancy. She has tried nothing for the symptoms.     Review of Systems  Constitutional:  Negative for chills, fever and malaise/fatigue.  Genitourinary:  Positive for dysuria, flank pain and hesitancy.  Skin:  Negative for rash.  All other systems reviewed and are negative.   Observations/Objective: Tele-visit patient is not in distress  Assessment and Plan:  UTI  vs  interstitial cystitis -urinalysis completed results  pending -pyridium for pain  Precaution and education provided -All questions answered -follow up with unresolved symptoms   Follow Up Instructions: Follow up with worsening unresolved symptoms    I discussed the assessment and treatment plan with the patient. The patient was provided an opportunity to ask questions and all were answered. The patient agreed with the plan and demonstrated an understanding of the instructions.   The patient was advised to call back or seek an in-person evaluation if the symptoms worsen or if the condition fails to improve as anticipated.  The above assessment and management plan was discussed with the patient. The patient verbalized understanding of and has agreed to the management plan. Patient is aware to call the clinic if symptoms persist or worsen. Patient is aware when to return to the clinic for a follow-up visit. Patient educated on when it is appropriate to go to the emergency department.   Time call ended:  1:36 pm   I provided 11 minutes of  non face-to-face time during this encounter.    Ivy Lynn, NP

## 2021-08-27 NOTE — Addendum Note (Signed)
Addended by: Ivy Lynn on: 08/27/2021 02:06 PM   Modules accepted: Orders

## 2021-08-30 LAB — URINE CULTURE

## 2021-09-04 ENCOUNTER — Other Ambulatory Visit: Payer: Self-pay | Admitting: Orthopedic Surgery

## 2021-09-04 ENCOUNTER — Other Ambulatory Visit (HOSPITAL_COMMUNITY): Payer: Self-pay | Admitting: Orthopedic Surgery

## 2021-09-04 DIAGNOSIS — M25512 Pain in left shoulder: Secondary | ICD-10-CM

## 2021-09-06 ENCOUNTER — Other Ambulatory Visit (HOSPITAL_COMMUNITY): Payer: Self-pay

## 2021-09-08 ENCOUNTER — Ambulatory Visit (HOSPITAL_COMMUNITY)
Admission: RE | Admit: 2021-09-08 | Discharge: 2021-09-08 | Disposition: A | Discharge status: Home / Self Care | Payer: No Typology Code available for payment source | Source: Ambulatory Visit | Attending: Orthopedic Surgery | Admitting: Orthopedic Surgery

## 2021-09-08 DIAGNOSIS — M25512 Pain in left shoulder: Secondary | ICD-10-CM | POA: Diagnosis not present

## 2021-10-22 HISTORY — PX: ROTATOR CUFF REPAIR: SHX139

## 2021-10-25 ENCOUNTER — Other Ambulatory Visit: Payer: Self-pay

## 2021-10-25 ENCOUNTER — Ambulatory Visit: Payer: No Typology Code available for payment source | Attending: Physician Assistant

## 2021-10-25 DIAGNOSIS — G8929 Other chronic pain: Secondary | ICD-10-CM | POA: Insufficient documentation

## 2021-10-25 DIAGNOSIS — M25612 Stiffness of left shoulder, not elsewhere classified: Secondary | ICD-10-CM | POA: Diagnosis present

## 2021-10-25 DIAGNOSIS — M25512 Pain in left shoulder: Secondary | ICD-10-CM | POA: Insufficient documentation

## 2021-10-25 NOTE — Therapy (Signed)
[facility] ?[place] ?[place], [place], [address] ?Phone: [phone]   Fax:  [phone] ? ?Physical Therapy Evaluation ? ?Patient Details  ?Name: Katherine Hays ?MRN: [id number] ?Date of Birth: Mar 28, 1971 ?Referring Provider (PT): Doren Custard, PA-C ? ? ?Encounter Date: 10/25/2021 ? ? PT End of Session - 10/25/21 0819   ? ? Visit Number 1   ? Number of Visits 12   ? Date for PT Re-Evaluation 11/23/21   ? PT Start Time 0820   ? PT Stop Time 0911   ? PT Time Calculation (min) 51 min   ? Activity Tolerance Patient tolerated treatment well   ? Behavior During Therapy [facility] for tasks assessed/performed   ? ?  ?  ? ?  ? ? ?Past Medical History:  ?Diagnosis Date  ? Anxiety   ? Asthma   ? OCD (obsessive compulsive disorder)   ? ? ?Past Surgical History:  ?Procedure Laterality Date  ? TONSILLECTOMY    ? TUBAL LIGATION    ? WISDOM TOOTH EXTRACTION    ? ? ?There were no vitals filed for this visit. ? ? ? Subjective Assessment - 10/25/21 0819   ? ? Subjective Patient reports that she had a left shoulder scope on 3/6. She notes that her shoulder feels alright today. However, her left arm has been going numb intermittently since surgery. She notes that she will be out of work for 8-10 weeks. She was told that she could take her arm out of the sling when she is at home.She was trying to move in her chair when she felt a pop in her shoulder, but it was not painful.   ? Pertinent History Hypothyroidism   ? Limitations House hold activities;Lifting   ? Patient Stated Goals reduced pain, carry laundry   ? Currently in Pain? Yes   ? Pain Score 10-Worst pain ever   ? Pain Location Shoulder   ? Pain Orientation Left   ? Pain Descriptors / Indicators Tightness   ? Pain Type Surgical pain   ? Pain Onset In the past 7 days   ? Pain Frequency Intermittent   ? Aggravating Factors  none known currently   ? Pain Relieving Factors light arm movement   ? Effect of Pain on Daily Activities unable to use  left arm   ? ?  ?  ? ?  ? ? ? ? ? [facility] PT Assessment - 10/25/21 0001   ? ?  ? Assessment  ? Medical Diagnosis Left shoulder scope   ? Referring Provider (PT) Doren Custard, PA-C   ? Onset Date/Surgical Date 10/22/21   ? Hand Dominance Right   ? Next MD Visit --   2 weeks  ? Prior Therapy Yes   ?  ? Precautions  ? Precautions Shoulder   ? Required Braces or Orthoses Sling   when in community  ?  ? Restrictions  ? Weight Bearing Restrictions No   ?  ? Balance Screen  ? Has the patient fallen in the past 6 months Yes   ? How many times? 2   ? Has the patient had a decrease in activity level because of a fear of falling?  No   ? Is the patient reluctant to leave their home because of a fear of falling?  No   ?  ? Prior Function  ? Level of Independence Independent   ? Vocation Full time employment   ? Vocation Requirements be able to use left  arm   ?  ? Cognition  ? Overall Cognitive Status Within Functional Limits for tasks assessed   ? Attention Focused   ? Focused Attention Appears intact   ? Memory Appears intact   ? Awareness Appears intact   ? Problem Solving Appears intact   ?  ? Observation/Other Assessments  ? Focus on Therapeutic Outcomes (FOTO)  4.31   ?  ? Sensation  ? Light Touch Impaired by gross assessment   "tingling" along C6-8 dermatomes  ? Additional Comments Patient reports intermittent tingling in left upper extremity   ?  ? ROM / Strength  ? AROM / PROM / Strength PROM   ?  ? PROM  ? PROM Assessment Site Shoulder   ? Right/Left Shoulder Left   ? Left Shoulder Flexion 85 Degrees   limited by pain  ? Left Shoulder ABduction 85 Degrees   limited by pain  ? Left Shoulder External Rotation 30 Degrees   at neutral  ?  ? Strength  ? Overall Strength Unable to assess;Due to precautions   ?  ? Palpation  ? Palpation comment TTP: left upper trapezius, bicep, deltoid   ? ?  ?  ? ?  ? ? ? ? ? ? ? ? ? ? ? ? ? ?Objective measurements completed on examination: See above findings.  ? ? ? ? ? [facility] Adult PT  Treatment/Exercise - 10/25/21 0001   ? ?  ? Shoulder Exercises: ROM/Strengthening  ? Pendulum AP x 10 reps   ?  ? Vasopneumatic  ? Number Minutes Vasopneumatic  15 minutes   ? Vasopnuematic Location  Shoulder   ? Vasopneumatic Pressure Low   ? Vasopneumatic Temperature  34   ?  ? Manual Therapy  ? Manual Therapy Soft tissue mobilization;Passive ROM   ? Soft tissue mobilization to left UT, bicep, and deltoid   ? Passive ROM All planes to tolerance   ? ?  ?  ? ?  ? ? ? ? ? ? ? ? ? ? ? ? ? ? ? PT Long Term Goals - 10/25/21 1636   ? ?  ? PT LONG TERM GOAL #1  ? Title Patient will be independent with her HEP.   ? Time 4   ? Period Weeks   ? Status New   ? Target Date 11/22/21   ?  ? PT LONG TERM GOAL #2  ? Title Patient will be able to demonstrate at least 120 degrees of active left shoulder flexion.   ? Time 4   ? Period Weeks   ? Status New   ? Target Date 11/22/21   ?  ? PT LONG TERM GOAL #3  ? Title Patient will be able to carry at least 8 pounds with her left upper extremity without being limited by her left shoulder for improved function carrying her laundry.   ? Time 4   ? Period Weeks   ? Status New   ? Target Date 11/22/21   ?  ? PT LONG TERM GOAL #4  ? Title Patient will be able to demonstrate at least 120 degrees of active left shoulder abduction for improved function reaching overhead.   ? Time 4   ? Period Weeks   ? Status New   ? Target Date 11/22/21   ? ?  ?  ? ?  ? ? ? ? ? ? ? ? ? Plan - 10/25/21 1020   ? ? Clinical Impression Statement Patient is  a 51 year old female presenting to skilled physical therapy following a left shoulder scope on 10/22/21. She presented with moderate to high pain severity and irritability. Her PROM was limited by her pain. Her AROM was not assessed at this time until further details on her surgical restrictions and precautions can be determined. Pendulums were added to her HEP for pain free left shoulder PROM. She reported that her shoulder felt better at the conclusion of her  appointment. She continues to require skilled physical therapy to address her remaining impairments to return to her prior level of function.   ? Personal Factors and Comorbidities Other   ? Examination-Activity Limitations Reach Overhead;Carry;Lift   ? Examination-Participation Restrictions Cleaning;Other;Occupation;Community Activity;Laundry   ? Stability/Clinical Decision Making Evolving/Moderate complexity   ? Clinical Decision Making Moderate   ? Rehab Potential Good   ? PT Frequency 3x / week   ? PT Duration 4 weeks   ? PT Treatment/Interventions Electrical Stimulation;Cryotherapy;Iontophoresis '4mg'$ /ml Dexamethasone;Moist Heat;Neuromuscular re-education;Therapeutic exercise;Therapeutic activities;Patient/family education;Manual techniques;Passive range of motion;Taping;Vasopneumatic Device   ? PT Next Visit Plan Pendulums, PROM, pulleys with modalities as needed   ? Consulted and Agree with Plan of Care Patient   ? ?  ?  ? ?  ? ? ?Patient will benefit from skilled therapeutic intervention in order to improve the following deficits and impairments:  Decreased range of motion, Impaired UE functional use, Decreased activity tolerance, Pain, Hypomobility, Decreased strength ? ?Visit Diagnosis: ?Acute pain of left shoulder ? ?Stiffness of left shoulder, not elsewhere classified ? ? ? ? ?Problem List ?Patient Active Problem List  ? Diagnosis Date Noted  ? Low hemoglobin 11/15/2020  ? Controlled substance agreement signed 02/04/2019  ? Benzodiazepine dependence ([place]) 02/04/2019  ? Gastroesophageal reflux disease 02/04/2019  ? Vitamin D deficiency 11/13/2017  ? Hypothyroidism 11/13/2017  ? Hyperlipemia 11/13/2017  ? Insomnia 11/10/2017  ? History of OCD (obsessive compulsive disorder) 01/12/2014  ? GAD (generalized anxiety disorder) 01/12/2014  ? Inguinal lymphadenopathy 05/26/2012  ? ? ?Darlin Coco, PT ?10/25/2021, 4:38 PM ? ?[facility] ?[place] ?[place], [place],  [address] ?Phone: [phone]   Fax:  [phone] ? ?Name: RONDELL FRICK ?MRN: [id number] ?Date of Birth: Jun 15, 1971 ? ? ?

## 2021-10-29 ENCOUNTER — Other Ambulatory Visit: Payer: Self-pay

## 2021-10-29 ENCOUNTER — Encounter: Payer: Self-pay | Admitting: Physical Therapy

## 2021-10-29 ENCOUNTER — Ambulatory Visit: Payer: No Typology Code available for payment source | Admitting: Physical Therapy

## 2021-10-29 DIAGNOSIS — M25612 Stiffness of left shoulder, not elsewhere classified: Secondary | ICD-10-CM

## 2021-10-29 DIAGNOSIS — G8929 Other chronic pain: Secondary | ICD-10-CM

## 2021-10-29 DIAGNOSIS — M25512 Pain in left shoulder: Secondary | ICD-10-CM

## 2021-10-29 NOTE — Therapy (Signed)
[place] ?[facility] ?[address] ?[place], [place], [address] ?Phone: [phone]   Fax:  [phone] ? ?Physical Therapy Treatment ? ?Patient Details  ?Name: Katherine Hays ?MRN: [id number] ?Date of Birth: March 10, 1971 ?Referring Provider (PT): Doren Custard, PA-C ? ? ?Encounter Date: 10/29/2021 ? ? PT End of Session - 10/29/21 0817   ? ? Visit Number 2   ? Number of Visits 12   ? Date for PT Re-Evaluation 11/23/21   ? PT Start Time [phone]   ? PT Stop Time 0858   ? PT Time Calculation (min) 41 min   ? Activity Tolerance Patient tolerated treatment well   ? Behavior During Therapy [facility] for tasks assessed/performed   ? ?  ?  ? ?  ? ? ?Past Medical History:  ?Diagnosis Date  ? Anxiety   ? Asthma   ? OCD (obsessive compulsive disorder)   ? ? ?Past Surgical History:  ?Procedure Laterality Date  ? TONSILLECTOMY    ? TUBAL LIGATION    ? WISDOM TOOTH EXTRACTION    ? ? ?There were no vitals filed for this visit. ? ? Subjective Assessment - 10/29/21 0817   ? ? Subjective reports she had some pain upon waking but took tylenol.   ? Pertinent History Hypothyroidism   ? Limitations House hold activities;Lifting   ? Patient Stated Goals reduced pain, carry laundry   ? Currently in Pain? No/denies   ? ?  ?  ? ?  ? ? ? ? ? [facility] PT Assessment - 10/29/21 0001   ? ?  ? Assessment  ? Medical Diagnosis Left shoulder scope   ? Referring Provider (PT) Doren Custard, PA-C   ? Onset Date/Surgical Date 10/22/21   ? Hand Dominance Right   ? Prior Therapy Yes   ?  ? Precautions  ? Precautions Shoulder   ? Required Braces or Orthoses Sling   ? ?  ?  ? ?  ? ? ? ? ? ? ? ? ? ? ? ? ? ? ? ? [place] Adult PT Treatment/Exercise - 10/29/21 0001   ? ?  ? Exercises  ? Exercises Shoulder   ?  ? Modalities  ? Modalities Vasopneumatic   ?  ? Vasopneumatic  ? Number Minutes Vasopneumatic  15 minutes   ? Vasopnuematic Location  Shoulder   ? Vasopneumatic Pressure Low   ? Vasopneumatic Temperature  34   ?  ? Manual Therapy  ? Manual Therapy  Passive ROM   ? Passive ROM PROM of L shoulder into flex, ER/IR with gentle holds at end range   ? ?  ?  ? ?  ? ? ? ? ? ? ? ? ? ? ? ? ? ? ? PT Long Term Goals - 10/29/21 0845   ? ?  ? PT LONG TERM GOAL #1  ? Title Patient will be independent with her HEP.   ? Time 4   ? Period Weeks   ? Status On-going   ? Target Date 11/22/21   ?  ? PT LONG TERM GOAL #2  ? Title Patient will be able to demonstrate at least 120 degrees of active left shoulder flexion.   ? Time 4   ? Period Weeks   ? Status On-going   ? Target Date 11/22/21   ?  ? PT LONG TERM GOAL #3  ? Title Patient will be able to carry at least 8 pounds with her left upper extremity without being limited by her  left shoulder for improved function carrying her laundry.   ? Time 4   ? Period Weeks   ? Status On-going   ? Target Date 11/22/21   ?  ? PT LONG TERM GOAL #4  ? Title Patient will be able to demonstrate at least 120 degrees of active left shoulder abduction for improved function reaching overhead.   ? Time 4   ? Period Weeks   ? Status On-going   ? Target Date 11/22/21   ? ?  ?  ? ?  ? ? ? ? ? ? ? ? Plan - 10/29/21 0853   ? ? Clinical Impression Statement Patient presented in clinic with no current pain. Patient did take Tylenol upon waking this morning for pain. Patient took off abductor pillow off sling as she said it made her shoulder hurt worse. Patient encouraged to rest with a throw pillow or bed pillow under her arm for positioning. Firm end feels and smooth arc of motion noted during PROM of L shoulder. Blue/purple ecchymosis noted over mid L bicep from surgery. Normal vasopneumatic response noted following removal of the modality.   ? Personal Factors and Comorbidities Other   ? Examination-Activity Limitations Reach Overhead;Carry;Lift   ? Examination-Participation Restrictions Cleaning;Other;Occupation;Community Activity;Laundry   ? Stability/Clinical Decision Making Evolving/Moderate complexity   ? Rehab Potential Good   ? PT Frequency 3x /  week   ? PT Duration 4 weeks   ? PT Treatment/Interventions Electrical Stimulation;Cryotherapy;Iontophoresis '4mg'$ /ml Dexamethasone;Moist Heat;Neuromuscular re-education;Therapeutic exercise;Therapeutic activities;Patient/family education;Manual techniques;Passive range of motion;Taping;Vasopneumatic Device   ? PT Next Visit Plan Pendulums, PROM, pulleys with modalities as needed   ? Consulted and Agree with Plan of Care Patient   ? ?  ?  ? ?  ? ? ?Patient will benefit from skilled therapeutic intervention in order to improve the following deficits and impairments:  Decreased range of motion, Impaired UE functional use, Decreased activity tolerance, Pain, Hypomobility, Decreased strength ? ?Visit Diagnosis: ?Acute pain of left shoulder ? ?Stiffness of left shoulder, not elsewhere classified ? ?Chronic left shoulder pain ? ? ? ? ?Problem List ?Patient Active Problem List  ? Diagnosis Date Noted  ? Low hemoglobin 11/15/2020  ? Controlled substance agreement signed 02/04/2019  ? Benzodiazepine dependence ([place]) 02/04/2019  ? Gastroesophageal reflux disease 02/04/2019  ? Vitamin D deficiency 11/13/2017  ? Hypothyroidism 11/13/2017  ? Hyperlipemia 11/13/2017  ? Insomnia 11/10/2017  ? History of OCD (obsessive compulsive disorder) 01/12/2014  ? GAD (generalized anxiety disorder) 01/12/2014  ? Inguinal lymphadenopathy 05/26/2012  ? ? ?Standley Brooking, PTA ?10/29/2021, 9:07 AM ? ?[place] ?[facility] ?[address] ?[place], [place], [address] ?Phone: [phone]   Fax:  [phone] ? ?Name: Katherine Hays ?MRN: [id number] ?Date of Birth: 1970/12/19 ? ? ? ?

## 2021-10-31 ENCOUNTER — Other Ambulatory Visit: Payer: Self-pay

## 2021-10-31 ENCOUNTER — Ambulatory Visit: Payer: No Typology Code available for payment source

## 2021-10-31 DIAGNOSIS — M25612 Stiffness of left shoulder, not elsewhere classified: Secondary | ICD-10-CM

## 2021-10-31 DIAGNOSIS — M25512 Pain in left shoulder: Secondary | ICD-10-CM | POA: Diagnosis not present

## 2021-10-31 NOTE — Therapy (Signed)
[place] ?[facility] ?[place] ?[place], [place], [address] ?Phone: [phone]   Fax:  [phone] ? ?Physical Therapy Treatment ? ?Patient Details  ?Name: Katherine Hays ?MRN: [id number] ?Date of Birth: Jan 09, 1971 ?Referring Provider (PT): Doren Custard, PA-C ? ? ?Encounter Date: 10/31/2021 ? ? PT End of Session - 10/31/21 3846   ? ? Visit Number 3   ? Number of Visits 12   ? Date for PT Re-Evaluation 11/23/21   ? PT Start Time 0815   ? PT Stop Time 0902   ? PT Time Calculation (min) 47 min   ? Activity Tolerance Patient tolerated treatment well   ? Behavior During Therapy [facility] for tasks assessed/performed   ? ?  ?  ? ?  ? ? ?Past Medical History:  ?Diagnosis Date  ? Anxiety   ? Asthma   ? OCD (obsessive compulsive disorder)   ? ? ?Past Surgical History:  ?Procedure Laterality Date  ? TONSILLECTOMY    ? TUBAL LIGATION    ? WISDOM TOOTH EXTRACTION    ? ? ?There were no vitals filed for this visit. ? ? Subjective Assessment - 10/31/21 0820   ? ? Subjective Patient reports that her shoulder is aching a little, but not bad.   ? Pertinent History Hypothyroidism   ? Limitations House hold activities;Lifting   ? Patient Stated Goals reduced pain, carry laundry   ? Currently in Pain? Yes   ? Pain Score 2    ? Pain Location Shoulder   ? Pain Orientation Left   ? Pain Descriptors / Indicators Aching   ? ?  ?  ? ?  ? ? ? ? ? ? ? ? ? ? ? ? ? ? ? ? ? ? ? ? [place] Adult PT Treatment/Exercise - 10/31/21 0001   ? ?  ? Elbow Exercises  ? Elbow Flexion Left;10 reps;Seated   ?  ? Shoulder Exercises: Seated  ? Retraction Both   3 minutes  ?  ? Shoulder Exercises: Pulleys  ? Flexion 5 minutes   left shoulder PROM  ?  ? Vasopneumatic  ? Number Minutes Vasopneumatic  15 minutes   ? Vasopnuematic Location  Shoulder   ? Vasopneumatic Pressure Low   ? Vasopneumatic Temperature  34   ?  ? Manual Therapy  ? Manual Therapy Passive ROM;Soft tissue mobilization   ? Soft tissue mobilization left rotator cuff   ?  Passive ROM PROM of L shoulder into flex, abduction, ER/IR with gentle holds at end range   ? ?  ?  ? ?  ? ? ? ? ? ? ? ? ? ? ? ? ? ? ? PT Long Term Goals - 10/29/21 0845   ? ?  ? PT LONG TERM GOAL #1  ? Title Patient will be independent with her HEP.   ? Time 4   ? Period Weeks   ? Status On-going   ? Target Date 11/22/21   ?  ? PT LONG TERM GOAL #2  ? Title Patient will be able to demonstrate at least 120 degrees of active left shoulder flexion.   ? Time 4   ? Period Weeks   ? Status On-going   ? Target Date 11/22/21   ?  ? PT LONG TERM GOAL #3  ? Title Patient will be able to carry at least 8 pounds with her left upper extremity without being limited by her left shoulder for improved function carrying her laundry.   ?  Time 4   ? Period Weeks   ? Status On-going   ? Target Date 11/22/21   ?  ? PT LONG TERM GOAL #4  ? Title Patient will be able to demonstrate at least 120 degrees of active left shoulder abduction for improved function reaching overhead.   ? Time 4   ? Period Weeks   ? Status On-going   ? Target Date 11/22/21   ? ?  ?  ? ?  ? ? ? ? ? ? ? ? Plan - 10/31/21 0827   ? ? Clinical Impression Statement Patient was introduced to pulleys and scapular retraction for left shoulder PROM and scapular mobility. She experienced a mild increase in soreness with the pulleys, but it was able to be reduced with manual therapy which focused on soft tissue mobilization to the rotator cuff followed by PROM. This was able to slightly improve her overall PROM. However, she expericned the most discomfort with passive ER. She reported that her shoulder felt good upon the conclusion of treatment. She continues to require skilled physical therapy to address her remaining impairments to return to her prior level of function.   ? Personal Factors and Comorbidities Other   ? Examination-Activity Limitations Reach Overhead;Carry;Lift   ? Examination-Participation Restrictions Cleaning;Other;Occupation;Community Activity;Laundry   ?  Stability/Clinical Decision Making Evolving/Moderate complexity   ? Rehab Potential Good   ? PT Frequency 3x / week   ? PT Duration 4 weeks   ? PT Treatment/Interventions Electrical Stimulation;Cryotherapy;Iontophoresis '4mg'$ /ml Dexamethasone;Moist Heat;Neuromuscular re-education;Therapeutic exercise;Therapeutic activities;Patient/family education;Manual techniques;Passive range of motion;Taping;Vasopneumatic Device   ? PT Next Visit Plan Pendulums, PROM, pulleys with modalities as needed   ? Consulted and Agree with Plan of Care Patient   ? ?  ?  ? ?  ? ? ?Patient will benefit from skilled therapeutic intervention in order to improve the following deficits and impairments:  Decreased range of motion, Impaired UE functional use, Decreased activity tolerance, Pain, Hypomobility, Decreased strength ? ?Visit Diagnosis: ?Acute pain of left shoulder ? ?Stiffness of left shoulder, not elsewhere classified ? ? ? ? ?Problem List ?Patient Active Problem List  ? Diagnosis Date Noted  ? Low hemoglobin 11/15/2020  ? Controlled substance agreement signed 02/04/2019  ? Benzodiazepine dependence ([place]) 02/04/2019  ? Gastroesophageal reflux disease 02/04/2019  ? Vitamin D deficiency 11/13/2017  ? Hypothyroidism 11/13/2017  ? Hyperlipemia 11/13/2017  ? Insomnia 11/10/2017  ? History of OCD (obsessive compulsive disorder) 01/12/2014  ? GAD (generalized anxiety disorder) 01/12/2014  ? Inguinal lymphadenopathy 05/26/2012  ? ? ?Darlin Coco, PT ?10/31/2021, 9:28 AM ? ?[place] ?[facility] ?[place] ?[place], [place], [address] ?Phone: [phone]   Fax:  [phone] ? ?Name: Katherine Hays ?MRN: [id number] ?Date of Birth: 10-27-1970 ? ? ? ?

## 2021-11-02 ENCOUNTER — Other Ambulatory Visit: Payer: Self-pay

## 2021-11-02 ENCOUNTER — Ambulatory Visit: Payer: No Typology Code available for payment source

## 2021-11-02 DIAGNOSIS — M25612 Stiffness of left shoulder, not elsewhere classified: Secondary | ICD-10-CM

## 2021-11-02 DIAGNOSIS — M25512 Pain in left shoulder: Secondary | ICD-10-CM | POA: Diagnosis not present

## 2021-11-02 NOTE — Therapy (Signed)
[place] ?[facility] ?[address] ?[place], [place], [address] ?Phone: [phone]   Fax:  [phone] ? ?Physical Therapy Treatment ? ?Patient Details  ?Name: Katherine Hays ?MRN: [id number] ?Date of Birth: [birth year] ?Referring Provider (PT): Doren Custard, PA-C ? ? ?Encounter Date: 11/02/2021 ? ? PT End of Session - 11/02/21 0858   ? ? Visit Number 4   ? Number of Visits 12   ? Date for PT Re-Evaluation 11/23/21   ? PT Start Time [phone]   ? PT Stop Time 0911   ? PT Time Calculation (min) 54 min   ? Activity Tolerance Patient tolerated treatment well   ? Behavior During Therapy [facility] for tasks assessed/performed   ? ?  ?  ? ?  ? ? ?Past Medical History:  ?Diagnosis Date  ? Anxiety   ? Asthma   ? OCD (obsessive compulsive disorder)   ? ? ?Past Surgical History:  ?Procedure Laterality Date  ? TONSILLECTOMY    ? TUBAL LIGATION    ? WISDOM TOOTH EXTRACTION    ? ? ?There were no vitals filed for this visit. ? ? Subjective Assessment - 11/02/21 0819   ? ? Subjective Patient reports that she woke up this morning with her arm hurting a little this morning.   ? Pertinent History Hypothyroidism   ? Limitations House hold activities;Lifting   ? Patient Stated Goals reduced pain, carry laundry   ? Currently in Pain? Yes   ? Pain Score 3    ? Pain Location Shoulder   ? Pain Orientation Left   ? ?  ?  ? ?  ? ? ? ? ? ? ? ? ? ? ? ? ? ? ? ? ? ? ? ? [place] Adult PT Treatment/Exercise - 11/02/21 0001   ? ?  ? Elbow Exercises  ? Elbow Flexion Left;15 reps;Standing   ?  ? Shoulder Exercises: Standing  ? Retraction Both;20 reps   ? Other Standing Exercises [place] twisting   red; 20 reps  ?  ? Shoulder Exercises: Pulleys  ? Flexion 3 minutes   left shoulder PROM  ?  ? Shoulder Exercises: ROM/Strengthening  ? Pendulum AP x 2 minutes   ?  ? Modalities  ? Modalities Vasopneumatic   ?  ? Vasopneumatic  ? Number Minutes Vasopneumatic  15 minutes   ? Vasopnuematic Location  Shoulder   ? Vasopneumatic Pressure Low   ?  Vasopneumatic Temperature  34   ?  ? Manual Therapy  ? Manual Therapy Joint mobilization;Soft tissue mobilization;Passive ROM   ? Joint Mobilization Grade I-II inferior glenohumeral   ? Soft tissue mobilization left rotator cuff and biceps   ? Passive ROM PROM of L shoulder into flex, abduction, ER/IR with gentle holds at end range   ? ?  ?  ? ?  ? ? ? ? ? ? ? ? ? ? ? ? ? ? ? PT Long Term Goals - 10/29/21 0845   ? ?  ? PT LONG TERM GOAL #1  ? Title Patient will be independent with her HEP.   ? Time 4   ? Period Weeks   ? Status On-going   ? Target Date 11/22/21   ?  ? PT LONG TERM GOAL #2  ? Title Patient will be able to demonstrate at least 120 degrees of active left shoulder flexion.   ? Time 4   ? Period Weeks   ? Status On-going   ? Target Date 11/22/21   ?  ?  PT LONG TERM GOAL #3  ? Title Patient will be able to carry at least 8 pounds with her left upper extremity without being limited by her left shoulder for improved function carrying her laundry.   ? Time 4   ? Period Weeks   ? Status On-going   ? Target Date 11/22/21   ?  ? PT LONG TERM GOAL #4  ? Title Patient will be able to demonstrate at least 120 degrees of active left shoulder abduction for improved function reaching overhead.   ? Time 4   ? Period Weeks   ? Status On-going   ? Target Date 11/22/21   ? ?  ?  ? ?  ? ? ? ? ? ? ? ? Plan - 11/02/21 0858   ? ? Clinical Impression Statement Treatment focused on familiar interventions for improved left shoulder PROM. Manual therapy focused on light glenohumeral joint mobilizations and soft tissue mobilization for reduced left shoulder pain and discomfort followed by PROM for improved shoulder mobility with fair results. Passive external rotation remains the most limited and painful. Utilizing the vasopneumatic machine at the conclusion of these interventions was significantly effective at reducing her familiar symptoms. She reported feeling good upon the conclusion of treatment. She continues to require  skilled physical therapy to address her remaining impairments to return to her prior level of function.   ? Personal Factors and Comorbidities Other   ? Examination-Activity Limitations Reach Overhead;Carry;Lift   ? Examination-Participation Restrictions Cleaning;Other;Occupation;Community Activity;Laundry   ? Stability/Clinical Decision Making Evolving/Moderate complexity   ? Rehab Potential Good   ? PT Frequency 3x / week   ? PT Duration 4 weeks   ? PT Treatment/Interventions Electrical Stimulation;Cryotherapy;Iontophoresis '4mg'$ /ml Dexamethasone;Moist Heat;Neuromuscular re-education;Therapeutic exercise;Therapeutic activities;Patient/family education;Manual techniques;Passive range of motion;Taping;Vasopneumatic Device   ? PT Next Visit Plan Pendulums, PROM, pulleys with modalities as needed   ? Consulted and Agree with Plan of Care Patient   ? ?  ?  ? ?  ? ? ?Patient will benefit from skilled therapeutic intervention in order to improve the following deficits and impairments:  Decreased range of motion, Impaired UE functional use, Decreased activity tolerance, Pain, Hypomobility, Decreased strength ? ?Visit Diagnosis: ?Acute pain of left shoulder ? ?Stiffness of left shoulder, not elsewhere classified ? ? ? ? ?Problem List ?Patient Active Problem List  ? Diagnosis Date Noted  ? Low hemoglobin 11/15/2020  ? Controlled substance agreement signed 02/04/2019  ? Benzodiazepine dependence ([place]) 02/04/2019  ? Gastroesophageal reflux disease 02/04/2019  ? Vitamin D deficiency 11/13/2017  ? Hypothyroidism 11/13/2017  ? Hyperlipemia 11/13/2017  ? Insomnia 11/10/2017  ? History of OCD (obsessive compulsive disorder) 01/12/2014  ? GAD (generalized anxiety disorder) 01/12/2014  ? Inguinal lymphadenopathy 05/26/2012  ? ? ?Darlin Coco, PT ?11/02/2021, 9:24 AM ? ?[place] ?[facility] ?[address] ?[place], [place], [address] ?Phone: [phone]   Fax:  [phone] ? ?Name: Katherine Hays ?MRN: [id number] ?Date of Birth: [birth year] ? ? ? ?

## 2021-11-05 ENCOUNTER — Other Ambulatory Visit: Payer: Self-pay

## 2021-11-05 ENCOUNTER — Ambulatory Visit: Payer: No Typology Code available for payment source | Admitting: Physical Therapy

## 2021-11-05 DIAGNOSIS — G8929 Other chronic pain: Secondary | ICD-10-CM

## 2021-11-05 DIAGNOSIS — M25512 Pain in left shoulder: Secondary | ICD-10-CM

## 2021-11-05 DIAGNOSIS — M25612 Stiffness of left shoulder, not elsewhere classified: Secondary | ICD-10-CM

## 2021-11-05 NOTE — Therapy (Signed)
Katherine Hays ?Phone: [phone]   Fax:  [phone] ? ?Physical Therapy Evaluation ? ?Patient Details  ?Name: Katherine Hays ?MRN: [id number] ?Date of Birth: 1971-03-07 ?Referring Provider (PT): Doren Custard, PA-C ? ? ?Encounter Date: 11/05/2021 ? ? PT End of Session - 11/05/21 0855   ? ? Visit Number 5   ? Number of Visits 12   ? Date for PT Re-Evaluation 11/23/21   ? PT Start Time 0818   ? PT Stop Time 0973   ? PT Time Calculation (min) 50 min   ? Activity Tolerance Patient tolerated treatment well   ? Behavior During Therapy [facility] for tasks assessed/performed   ? ?  ?  ? ?  ? ? ?Past Medical History:  ?Diagnosis Date  ? Anxiety   ? Asthma   ? OCD (obsessive compulsive disorder)   ? ? ?Past Surgical History:  ?Procedure Laterality Date  ? TONSILLECTOMY    ? TUBAL LIGATION    ? WISDOM TOOTH EXTRACTION    ? ? ?There were no vitals filed for this visit. ? ? ? Subjective Assessment - 11/05/21 0856   ? ? Subjective Shoulder hurt a lot Saturday and difficulty sleeping due to housework on Friday. Forgot sling today.  Pain at a 2.   ? Pertinent History Hypothyroidism   ? Limitations House hold activities;Lifting   ? Patient Stated Goals reduced pain, carry laundry   ? Currently in Pain? Yes   ? Pain Score 2    ? Pain Location Shoulder   ? Pain Orientation Left   ? Pain Descriptors / Indicators Aching   ? Pain Type Surgical pain   ? Pain Onset 1 to 4 weeks ago   ? ?  ?  ? ?  ? ? ? ? ? ? ? ? ? ? ? ? ? ? ? ? ? ?Objective measurements completed on examination: See above findings.  ? ? ? ? ? [place] Adult PT Treatment/Exercise - 11/05/21 0001   ? ?  ? Modalities  ? Modalities Vasopneumatic   ?  ? Vasopneumatic  ? Number Minutes Vasopneumatic  15 minutes   ? Vasopnuematic Location  --   Left shoulder with pillow between thorax and left elbow.  ?  ? Manual Therapy  ? Passive ROM In supine:  Gentle P/AROM to patient's left shoulder with focus on flexiona  nd ER x 24 minutes.   ? ?  ?  ? ?  ? ? ? ? ? ? ? ? ? ? ? ? ? ? ? PT Long Term Goals - 10/29/21 0845   ? ?  ? PT LONG TERM GOAL #1  ? Title Patient will be independent with her HEP.   ? Time 4   ? Period Weeks   ? Status On-going   ? Target Date 11/22/21   ?  ? PT LONG TERM GOAL #2  ? Title Patient will be able to demonstrate at least 120 degrees of active left shoulder flexion.   ? Time 4   ? Period Weeks   ? Status On-going   ? Target Date 11/22/21   ?  ? PT LONG TERM GOAL #3  ? Title Patient will be able to carry at least 8 pounds with her left upper extremity without being limited by her left shoulder for improved function carrying her laundry.   ? Time 4   ? Period Weeks   ? Status On-going   ?  Target Date 11/22/21   ?  ? PT LONG TERM GOAL #4  ? Title Patient will be able to demonstrate at least 120 degrees of active left shoulder abduction for improved function reaching overhead.   ? Time 4   ? Period Weeks   ? Status On-going   ? Target Date 11/22/21   ? ?  ?  ? ?  ? ? ? ? ? ? ? ? ? Plan - 11/05/21 0859   ? ? Clinical Impression Statement The patient did very weel today with gentle left shoulder P/AROM.  Her range of motion is improving nicely.   ? Personal Factors and Comorbidities Other   ? Examination-Activity Limitations Reach Overhead;Carry;Lift   ? Examination-Participation Restrictions Cleaning;Other;Occupation;Community Activity;Laundry   ? Stability/Clinical Decision Making Evolving/Moderate complexity   ? Rehab Potential Good   ? PT Frequency 3x / week   ? PT Duration 4 weeks   ? PT Treatment/Interventions Electrical Stimulation;Cryotherapy;Iontophoresis '4mg'$ /ml Dexamethasone;Moist Heat;Neuromuscular re-education;Therapeutic exercise;Therapeutic activities;Patient/family education;Manual techniques;Passive range of motion;Taping;Vasopneumatic Device   ? PT Next Visit Plan Pendulums, PROM, pulleys with modalities as needed   ? Consulted and Agree with Plan of Care Patient   ? ?  ?  ? ?  ? ? ?Patient  will benefit from skilled therapeutic intervention in order to improve the following deficits and impairments:  Decreased range of motion, Impaired UE functional use, Decreased activity tolerance, Pain, Hypomobility, Decreased strength ? ?Visit Diagnosis: ?Acute pain of left shoulder ? ?Stiffness of left shoulder, not elsewhere classified ? ?Chronic left shoulder pain ? ? ? ? ?Problem List ?Patient Active Problem List  ? Diagnosis Date Noted  ? Low hemoglobin 11/15/2020  ? Controlled substance agreement signed 02/04/2019  ? Benzodiazepine dependence ([place]) 02/04/2019  ? Gastroesophageal reflux disease 02/04/2019  ? Vitamin D deficiency 11/13/2017  ? Hypothyroidism 11/13/2017  ? Hyperlipemia 11/13/2017  ? Insomnia 11/10/2017  ? History of OCD (obsessive compulsive disorder) 01/12/2014  ? GAD (generalized anxiety disorder) 01/12/2014  ? Inguinal lymphadenopathy 05/26/2012  ? ? ?Katherine Hays, Katherine Hays, PT ?11/05/2021, 9:09 AM ? ? ?[facility] ?[place] ?[place], [place], [address] ?Phone: [phone]   Fax:  [phone] ? ?Name: Katherine Hays ?MRN: [id number] ?Date of Birth: February 14, 1971 ? ? ?

## 2021-11-07 ENCOUNTER — Encounter: Payer: Self-pay | Admitting: Physical Therapy

## 2021-11-07 ENCOUNTER — Other Ambulatory Visit: Payer: Self-pay

## 2021-11-07 ENCOUNTER — Ambulatory Visit: Payer: No Typology Code available for payment source | Admitting: Physical Therapy

## 2021-11-07 DIAGNOSIS — M25512 Pain in left shoulder: Secondary | ICD-10-CM | POA: Diagnosis not present

## 2021-11-07 DIAGNOSIS — M25612 Stiffness of left shoulder, not elsewhere classified: Secondary | ICD-10-CM

## 2021-11-07 NOTE — Therapy (Signed)
[place] ?[facility] ?[place] ?[place], [place], [address] ?Phone: [phone]   Fax:  [phone] ? ?Physical Therapy Treatment ? ?Patient Details  ?Name: Katherine Hays ?MRN: [id number] ?Date of Birth: 1971-03-25 ?Referring Provider (PT): Doren Custard, PA-C ? ? ?Encounter Date: 11/07/2021 ? ? PT End of Session - 11/07/21 4193   ? ? Visit Number 6   ? Number of Visits 12   ? Date for PT Re-Evaluation 11/23/21   ? PT Start Time 0820   ? PT Stop Time 0858   ? PT Time Calculation (min) 38 min   ? Activity Tolerance Patient tolerated treatment well   ? Behavior During Therapy [facility] for tasks assessed/performed   ? ?  ?  ? ?  ? ? ?Past Medical History:  ?Diagnosis Date  ? Anxiety   ? Asthma   ? OCD (obsessive compulsive disorder)   ? ? ?Past Surgical History:  ?Procedure Laterality Date  ? TONSILLECTOMY    ? TUBAL LIGATION    ? WISDOM TOOTH EXTRACTION    ? ? ?There were no vitals filed for this visit. ? ? Subjective Assessment - 11/07/21 0819   ? ? Subjective Reports that she is a little achey but got a good report from Dr. Veverly Fells.   ? Pertinent History Hypothyroidism   ? Limitations House hold activities;Lifting   ? Patient Stated Goals reduced pain, carry laundry   ? Currently in Pain? Yes   ? Pain Score 1    ? Pain Location Shoulder   ? Pain Orientation Left   ? Pain Descriptors / Indicators Aching   ? Pain Type Surgical pain   ? Pain Onset 1 to 4 weeks ago   ? Pain Frequency Intermittent   ? ?  ?  ? ?  ? ? ? ? ? [facility] PT Assessment - 11/07/21 0001   ? ?  ? Assessment  ? Medical Diagnosis Left shoulder scope   ? Referring Provider (PT) Doren Custard, PA-C   ? Onset Date/Surgical Date 10/22/21   ? Hand Dominance Right   ? Next MD Visit 11/2021   ? Prior Therapy Yes   ?  ? Precautions  ? Precautions Shoulder   ? Required Braces or Orthoses Sling   ? ?  ?  ? ?  ? ? ? ? ? ? ? ? ? ? ? ? ? ? ? ? [place] Adult PT Treatment/Exercise - 11/07/21 0001   ? ?  ? Modalities  ? Modalities Vasopneumatic   ?  ?  Vasopneumatic  ? Number Minutes Vasopneumatic  10 minutes   ? Vasopnuematic Location  Shoulder   ? Vasopneumatic Pressure Low   ? Vasopneumatic Temperature  34   ?  ? Manual Therapy  ? Manual Therapy Passive ROM   ? Passive ROM PROM of L shoulder into flex/ER/IR with gentle holds at end range   ? ?  ?  ? ?  ? ? ? ? ? ? ? ? ? ? ? ? ? ? ? PT Long Term Goals - 10/29/21 0845   ? ?  ? PT LONG TERM GOAL #1  ? Title Patient will be independent with her HEP.   ? Time 4   ? Period Weeks   ? Status On-going   ? Target Date 11/22/21   ?  ? PT LONG TERM GOAL #2  ? Title Patient will be able to demonstrate at least 120 degrees of active left shoulder flexion.   ?  Time 4   ? Period Weeks   ? Status On-going   ? Target Date 11/22/21   ?  ? PT LONG TERM GOAL #3  ? Title Patient will be able to carry at least 8 pounds with her left upper extremity without being limited by her left shoulder for improved function carrying her laundry.   ? Time 4   ? Period Weeks   ? Status On-going   ? Target Date 11/22/21   ?  ? PT LONG TERM GOAL #4  ? Title Patient will be able to demonstrate at least 120 degrees of active left shoulder abduction for improved function reaching overhead.   ? Time 4   ? Period Weeks   ? Status On-going   ? Target Date 11/22/21   ? ?  ?  ? ?  ? ? ? ? ? ? ? ? Plan - 11/07/21 0854   ? ? Clinical Impression Statement Patient presented in clinic with sling and adductor pillow donned. Patient unaware of any new instructions regarding sling although she does not wear sling at home. Patient advised regarding her surgery type and the extra care that should be taken to protect L shoulder. Firm end feels and smooth arc of motion noted during PROM of L shoulder. Normal vasopneumatic response noted following removal of the modality.   ? Personal Factors and Comorbidities Other   ? Examination-Activity Limitations Reach Overhead;Carry;Lift   ? Examination-Participation Restrictions Cleaning;Other;Occupation;Community  Activity;Laundry   ? Stability/Clinical Decision Making Evolving/Moderate complexity   ? Rehab Potential Good   ? PT Frequency 3x / week   ? PT Duration 4 weeks   ? PT Treatment/Interventions Electrical Stimulation;Cryotherapy;Iontophoresis '4mg'$ /ml Dexamethasone;Moist Heat;Neuromuscular re-education;Therapeutic exercise;Therapeutic activities;Patient/family education;Manual techniques;Passive range of motion;Taping;Vasopneumatic Device   ? PT Next Visit Plan Pendulums, PROM, pulleys with modalities as needed   ? Consulted and Agree with Plan of Care Patient   ? ?  ?  ? ?  ? ? ?Patient will benefit from skilled therapeutic intervention in order to improve the following deficits and impairments:  Decreased range of motion, Impaired UE functional use, Decreased activity tolerance, Pain, Hypomobility, Decreased strength ? ?Visit Diagnosis: ?Acute pain of left shoulder ? ?Stiffness of left shoulder, not elsewhere classified ? ? ? ? ?Problem List ?Patient Active Problem List  ? Diagnosis Date Noted  ? Low hemoglobin 11/15/2020  ? Controlled substance agreement signed 02/04/2019  ? Benzodiazepine dependence ([place]) 02/04/2019  ? Gastroesophageal reflux disease 02/04/2019  ? Vitamin D deficiency 11/13/2017  ? Hypothyroidism 11/13/2017  ? Hyperlipemia 11/13/2017  ? Insomnia 11/10/2017  ? History of OCD (obsessive compulsive disorder) 01/12/2014  ? GAD (generalized anxiety disorder) 01/12/2014  ? Inguinal lymphadenopathy 05/26/2012  ? ? ?Standley Brooking, PTA ?11/07/2021, 9:28 AM ? ?[place] ?[facility] ?[place] ?[place], [place], [address] ?Phone: [phone]   Fax:  [phone] ? ?Name: Katherine Hays ?MRN: [id number] ?Date of Birth: 12/10/1970 ? ? ? ?

## 2021-11-09 ENCOUNTER — Encounter: Payer: Self-pay | Admitting: Physical Therapy

## 2021-11-09 ENCOUNTER — Other Ambulatory Visit: Payer: Self-pay

## 2021-11-09 ENCOUNTER — Ambulatory Visit: Payer: No Typology Code available for payment source | Admitting: Physical Therapy

## 2021-11-09 DIAGNOSIS — M25512 Pain in left shoulder: Secondary | ICD-10-CM

## 2021-11-09 DIAGNOSIS — M25612 Stiffness of left shoulder, not elsewhere classified: Secondary | ICD-10-CM

## 2021-11-09 NOTE — Therapy (Signed)
[place] ?[facility] ?[place] ?[place], [place], [address] ?Phone: [phone]   Fax:  [phone] ? ?Physical Therapy Treatment ? ?Patient Details  ?Name: Katherine Hays ?MRN: [id number] ?Date of Birth: 11-03-1970 ?Referring Provider (PT): Doren Custard, PA-C ? ? ?Encounter Date: 11/09/2021 ? ? PT End of Session - 11/09/21 0820   ? ? Visit Number 7   ? Number of Visits 12   ? Date for PT Re-Evaluation 11/23/21   ? PT Start Time 0820   ? PT Stop Time [phone]   ? PT Time Calculation (min) 43 min   ? Activity Tolerance Patient tolerated treatment well   ? Behavior During Therapy [facility] for tasks assessed/performed   ? ?  ?  ? ?  ? ? ?Past Medical History:  ?Diagnosis Date  ? Anxiety   ? Asthma   ? OCD (obsessive compulsive disorder)   ? ? ?Past Surgical History:  ?Procedure Laterality Date  ? TONSILLECTOMY    ? TUBAL LIGATION    ? WISDOM TOOTH EXTRACTION    ? ? ?There were no vitals filed for this visit. ? ? Subjective Assessment - 11/09/21 0819   ? ? Subjective Had more pain since last visit and having to take pain medication and muscle relaxers.   ? Pertinent History Hypothyroidism   ? Limitations House hold activities;Lifting   ? Patient Stated Goals reduced pain, carry laundry   ? Currently in Pain? Yes   ? Pain Score 5    ? Pain Location Shoulder   ? Pain Orientation Left   ? Pain Descriptors / Indicators Discomfort;Sore   ? Pain Type Surgical pain   ? Pain Onset 1 to 4 weeks ago   ? Pain Frequency Constant   ? ?  ?  ? ?  ? ? ? ? ? [facility] PT Assessment - 11/09/21 0001   ? ?  ? Assessment  ? Medical Diagnosis Left shoulder scope   ? Referring Provider (PT) Doren Custard, PA-C   ? Onset Date/Surgical Date 10/22/21   ? Hand Dominance Right   ? Next MD Visit 11/2021   ? Prior Therapy Yes   ?  ? Precautions  ? Precautions Shoulder   ? ?  ?  ? ?  ? ? ? ? ? ? ? ? ? ? ? ? ? ? ? ? [place] Adult PT Treatment/Exercise - 11/09/21 0001   ? ?  ? Modalities  ? Modalities Electrical Stimulation;Vasopneumatic   ?  ?  Electrical Stimulation  ? Electrical Stimulation Location L shoulder   ? Electrical Stimulation Action Pre-Mod   ? Electrical Stimulation Parameters 80-150 hz x10 min   ? Electrical Stimulation Goals Pain   ?  ? Vasopneumatic  ? Number Minutes Vasopneumatic  10 minutes   ? Vasopnuematic Location  Shoulder   ? Vasopneumatic Pressure Low   ? Vasopneumatic Temperature  34   ?  ? Manual Therapy  ? Manual Therapy Passive ROM   ? Passive ROM PROM of L shoulder into flex/ER/IR with gentle holds at end range   ? ?  ?  ? ?  ? ? ? ? ? ? ? ? ? ? PT Education - 11/09/21 0906   ? ? Education Details Shoulder flexion walk aways   ? Person(s) Educated Patient   ? Methods Demonstration;Explanation   ? Comprehension Verbalized understanding   ? ?  ?  ? ?  ? ? ? ? ? ? PT Long Term Goals - 10/29/21  0845   ? ?  ? PT LONG TERM GOAL #1  ? Title Patient will be independent with her HEP.   ? Time 4   ? Period Weeks   ? Status On-going   ? Target Date 11/22/21   ?  ? PT LONG TERM GOAL #2  ? Title Patient will be able to demonstrate at least 120 degrees of active left shoulder flexion.   ? Time 4   ? Period Weeks   ? Status On-going   ? Target Date 11/22/21   ?  ? PT LONG TERM GOAL #3  ? Title Patient will be able to carry at least 8 pounds with her left upper extremity without being limited by her left shoulder for improved function carrying her laundry.   ? Time 4   ? Period Weeks   ? Status On-going   ? Target Date 11/22/21   ?  ? PT LONG TERM GOAL #4  ? Title Patient will be able to demonstrate at least 120 degrees of active left shoulder abduction for improved function reaching overhead.   ? Time 4   ? Period Weeks   ? Status On-going   ? Target Date 11/22/21   ? ?  ?  ? ?  ? ? ? ? ? ? ? ? Plan - 11/09/21 0853   ? ? Clinical Impression Statement Patient presented in clinic with reports of increased L shoulder pain in which she has used prescription pain medication as well as muscle relaxers. Patient more limited with PROM due to pain  and oscillations utilized throughout therex to assist with reducing muscle guarding and pain relief. Patient more limited with PROM flexion today. Empty flexion end feels due to pain and more eccentric pain reported as well. Normal modalities response noted following removal of the modalities.   ? Personal Factors and Comorbidities Other   ? Examination-Activity Limitations Reach Overhead;Carry;Lift   ? Examination-Participation Restrictions Cleaning;Other;Occupation;Community Activity;Laundry   ? Stability/Clinical Decision Making Evolving/Moderate complexity   ? Rehab Potential Good   ? PT Frequency 3x / week   ? PT Duration 4 weeks   ? PT Treatment/Interventions Electrical Stimulation;Cryotherapy;Iontophoresis '4mg'$ /ml Dexamethasone;Moist Heat;Neuromuscular re-education;Therapeutic exercise;Therapeutic activities;Patient/family education;Manual techniques;Passive range of motion;Taping;Vasopneumatic Device   ? PT Next Visit Plan Pendulums, PROM, pulleys with modalities as needed   ? Consulted and Agree with Plan of Care Patient   ? ?  ?  ? ?  ? ? ?Patient will benefit from skilled therapeutic intervention in order to improve the following deficits and impairments:  Decreased range of motion, Impaired UE functional use, Decreased activity tolerance, Pain, Hypomobility, Decreased strength ? ?Visit Diagnosis: ?Acute pain of left shoulder ? ?Stiffness of left shoulder, not elsewhere classified ? ? ? ? ?Problem List ?Patient Active Problem List  ? Diagnosis Date Noted  ? Low hemoglobin 11/15/2020  ? Controlled substance agreement signed 02/04/2019  ? Benzodiazepine dependence ([place]) 02/04/2019  ? Gastroesophageal reflux disease 02/04/2019  ? Vitamin D deficiency 11/13/2017  ? Hypothyroidism 11/13/2017  ? Hyperlipemia 11/13/2017  ? Insomnia 11/10/2017  ? History of OCD (obsessive compulsive disorder) 01/12/2014  ? GAD (generalized anxiety disorder) 01/12/2014  ? Inguinal lymphadenopathy 05/26/2012  ? ? ?Standley Brooking,  PTA ?11/09/2021, 9:06 AM ? ?[place] ?[facility] ?[place] ?[place], [place], [address] ?Phone: [phone]   Fax:  [phone] ? ?Name: ALLISHA HARTER ?MRN: [id number] ?Date of Birth: Aug 15, 1971 ? ? ? ?

## 2021-11-10 ENCOUNTER — Other Ambulatory Visit: Payer: Self-pay | Admitting: Family

## 2021-11-10 DIAGNOSIS — F1320 Sedative, hypnotic or anxiolytic dependence, uncomplicated: Secondary | ICD-10-CM

## 2021-11-10 DIAGNOSIS — Z79899 Other long term (current) drug therapy: Secondary | ICD-10-CM

## 2021-11-10 DIAGNOSIS — F411 Generalized anxiety disorder: Secondary | ICD-10-CM

## 2021-11-12 ENCOUNTER — Other Ambulatory Visit (HOSPITAL_COMMUNITY): Payer: Self-pay

## 2021-11-12 MED ORDER — ALPRAZOLAM 0.5 MG PO TABS
ORAL_TABLET | Freq: Two times a day (BID) | ORAL | 5 refills | Status: DC
  Filled 2021-11-12: qty 60, 30d supply, fill #0
  Filled 2022-01-22: qty 60, 30d supply, fill #1
  Filled 2022-03-05: qty 60, 30d supply, fill #2

## 2021-11-13 ENCOUNTER — Other Ambulatory Visit: Payer: Self-pay | Admitting: Family

## 2021-11-13 ENCOUNTER — Ambulatory Visit: Payer: No Typology Code available for payment source | Admitting: *Deleted

## 2021-11-13 ENCOUNTER — Other Ambulatory Visit: Payer: Self-pay

## 2021-11-13 DIAGNOSIS — M25612 Stiffness of left shoulder, not elsewhere classified: Secondary | ICD-10-CM

## 2021-11-13 DIAGNOSIS — G4700 Insomnia, unspecified: Secondary | ICD-10-CM

## 2021-11-13 DIAGNOSIS — Z8659 Personal history of other mental and behavioral disorders: Secondary | ICD-10-CM

## 2021-11-13 DIAGNOSIS — F411 Generalized anxiety disorder: Secondary | ICD-10-CM

## 2021-11-13 DIAGNOSIS — M25512 Pain in left shoulder: Secondary | ICD-10-CM | POA: Diagnosis not present

## 2021-11-13 NOTE — Therapy (Signed)
[place] ?[facility] ?[place] ?[place], [place], [address] ?Phone: [phone]   Fax:  [phone] ? ?Physical Therapy Treatment ? ?Patient Details  ?Name: AIJALON DEMURO ?MRN: [id number] ?Date of Birth: 02/26/1971 ?Referring Provider (PT): Doren Custard, PA-C ? ? ?Encounter Date: 11/13/2021 ? ? ? ?Past Medical History:  ?Diagnosis Date  ? Anxiety   ? Asthma   ? OCD (obsessive compulsive disorder)   ? ? ?Past Surgical History:  ?Procedure Laterality Date  ? TONSILLECTOMY    ? TUBAL LIGATION    ? WISDOM TOOTH EXTRACTION    ? ? ?There were no vitals filed for this visit. ? ? Subjective Assessment - 11/13/21 0951   ? ? Subjective Did ok after last Rx. 3/10 today LT shldr   ? Pertinent History Hypothyroidism   ? Patient Stated Goals reduced pain, carry laundry   ? Currently in Pain? Yes   ? Pain Score 3    ? Pain Location Shoulder   ? Pain Orientation Left   ? Pain Descriptors / Indicators Discomfort   ? Pain Type Surgical pain   ? Pain Onset 1 to 4 weeks ago   ? ?  ?  ? ?  ? ? ? ? ? ? ? ? ? ? ? ? ? ? ? ? ? ? ? ? [place] Adult PT Treatment/Exercise - 11/13/21 0001   ? ?  ? Shoulder Exercises: Supine  ? Other Supine Exercises Reviewed HEP   ?  ? Modalities  ? Modalities Electrical Stimulation;Vasopneumatic   ?  ? Vasopneumatic  ? Number Minutes Vasopneumatic  15 minutes   ? Vasopnuematic Location  Shoulder   ? Vasopneumatic Pressure Low   ? Vasopneumatic Temperature  34   ?  ? Manual Therapy  ? Manual Therapy Passive ROM   ? Passive ROM PROM of L shoulder into flex/ER/IR with gentle holds at end range   ? ?  ?  ? ?  ? ? ? ? ? ? ? ? ? ? ? ? ? ? ? PT Long Term Goals - 10/29/21 0845   ? ?  ? PT LONG TERM GOAL #1  ? Title Patient will be independent with her HEP.   ? Time 4   ? Period Weeks   ? Status On-going   ? Target Date 11/22/21   ?  ? PT LONG TERM GOAL #2  ? Title Patient will be able to demonstrate at least 120 degrees of active left shoulder flexion.   ? Time 4   ? Period Weeks   ?  Status On-going   ? Target Date 11/22/21   ?  ? PT LONG TERM GOAL #3  ? Title Patient will be able to carry at least 8 pounds with her left upper extremity without being limited by her left shoulder for improved function carrying her laundry.   ? Time 4   ? Period Weeks   ? Status On-going   ? Target Date 11/22/21   ?  ? PT LONG TERM GOAL #4  ? Title Patient will be able to demonstrate at least 120 degrees of active left shoulder abduction for improved function reaching overhead.   ? Time 4   ? Period Weeks   ? Status On-going   ? Target Date 11/22/21   ? ?  ?  ? ?  ? ? ? ? ? ? ? ? Plan - 11/13/21 0955   ? ? Clinical Impression Statement PT ARRIVED TODAY DOING FAIRLY  WELL WITH ABD PILLOW DONNED. prom PERFORMED FOR FLEXION, IR, and ER. HEP also reviewed. Vaso end of session. Felt good after RX   ? Personal Factors and Comorbidities Other   ? Examination-Activity Limitations Reach Overhead;Carry;Lift   ? Stability/Clinical Decision Making Evolving/Moderate complexity   ? Rehab Potential Good   ? PT Frequency 3x / week   ? PT Duration 4 weeks   ? PT Treatment/Interventions Electrical Stimulation;Cryotherapy;Iontophoresis '4mg'$ /ml Dexamethasone;Moist Heat;Neuromuscular re-education;Therapeutic exercise;Therapeutic activities;Patient/family education;Manual techniques;Passive range of motion;Taping;Vasopneumatic Device   ? PT Next Visit Plan Pendulums, PROM,  modalities as needed   ? Consulted and Agree with Plan of Care Patient   ? ?  ?  ? ?  ? ? ?Patient will benefit from skilled therapeutic intervention in order to improve the following deficits and impairments:  Decreased range of motion, Impaired UE functional use, Decreased activity tolerance, Pain, Hypomobility, Decreased strength ? ?Visit Diagnosis: ?Acute pain of left shoulder ? ?Stiffness of left shoulder, not elsewhere classified ? ? ? ? ?Problem List ?Patient Active Problem List  ? Diagnosis Date Noted  ? Low hemoglobin 11/15/2020  ? Controlled substance  agreement signed 02/04/2019  ? Benzodiazepine dependence ([place]) 02/04/2019  ? Gastroesophageal reflux disease 02/04/2019  ? Vitamin D deficiency 11/13/2017  ? Hypothyroidism 11/13/2017  ? Hyperlipemia 11/13/2017  ? Insomnia 11/10/2017  ? History of OCD (obsessive compulsive disorder) 01/12/2014  ? GAD (generalized anxiety disorder) 01/12/2014  ? Inguinal lymphadenopathy 05/26/2012  ? ? ?Trelyn Vanderlinde,CHRIS, PTA ?11/13/2021, 9:59 AM ? ?[place] ?[facility] ?[place] ?[place], [place], [address] ?Phone: [phone]   Fax:  [phone] ? ?Name: VICKYE ASTORINO ?MRN: [id number] ?Date of Birth: 06/13/1971 ? ? ? ?

## 2021-11-14 ENCOUNTER — Ambulatory Visit: Payer: No Typology Code available for payment source

## 2021-11-14 ENCOUNTER — Other Ambulatory Visit (HOSPITAL_COMMUNITY): Payer: Self-pay

## 2021-11-14 DIAGNOSIS — M25512 Pain in left shoulder: Secondary | ICD-10-CM | POA: Diagnosis not present

## 2021-11-14 DIAGNOSIS — M25612 Stiffness of left shoulder, not elsewhere classified: Secondary | ICD-10-CM

## 2021-11-14 MED ORDER — SERTRALINE HCL 100 MG PO TABS
100.0000 mg | ORAL_TABLET | Freq: Every day | ORAL | 1 refills | Status: DC
  Filled 2021-11-14: qty 90, 90d supply, fill #0
  Filled 2022-03-05: qty 90, 90d supply, fill #1

## 2021-11-14 NOTE — Therapy (Signed)
[place] ?[facility] ?[place] ?[place], [place], [address] ?Phone: [phone]   Fax:  [phone] ? ?Physical Therapy Treatment ? ?Patient Details  ?Name: Katherine Hays ?MRN: [id number] ?Date of Birth: 07-Oct-1970 ?Referring Provider (PT): Doren Custard, PA-C ? ? ?Encounter Date: 11/14/2021 ? ? PT End of Session - 11/14/21 0277   ? ? Visit Number 8   ? Number of Visits 12   ? Date for PT Re-Evaluation 11/23/21   ? PT Start Time 0815   ? PT Stop Time 0910   ? PT Time Calculation (min) 55 min   ? Activity Tolerance Patient tolerated treatment well   ? Behavior During Therapy [facility] for tasks assessed/performed   ? ?  ?  ? ?  ? ? ?Past Medical History:  ?Diagnosis Date  ? Anxiety   ? Asthma   ? OCD (obsessive compulsive disorder)   ? ? ?Past Surgical History:  ?Procedure Laterality Date  ? TONSILLECTOMY    ? TUBAL LIGATION    ? WISDOM TOOTH EXTRACTION    ? ? ?There were no vitals filed for this visit. ? ? Subjective Assessment - 11/14/21 0815   ? ? Subjective Patient reports that her shoulder feels alright today as it is not really hurting.   ? Pertinent History Hypothyroidism   ? Patient Stated Goals reduced pain, carry laundry   ? Currently in Pain? No/denies   ? Pain Onset 1 to 4 weeks ago   ? ?  ?  ? ?  ? ? ? ? ? ? ? ? ? ? ? ? ? ? ? ? ? ? ? ? [place] Adult PT Treatment/Exercise - 11/14/21 0001   ? ?  ? Modalities  ? Modalities Vasopneumatic   ?  ? Vasopneumatic  ? Number Minutes Vasopneumatic  15 minutes   ? Vasopnuematic Location  Shoulder   ? Vasopneumatic Pressure Low   ? Vasopneumatic Temperature  34   ?  ? Manual Therapy  ? Manual Therapy Joint mobilization;Soft tissue mobilization;Passive ROM   ? Joint Mobilization Grade I-II inferior and posterior glenohumeral   ? Soft tissue mobilization left rotator cuff and biceps   ? Passive ROM all planes to tolerance   ? ?  ?  ? ?  ? ? ? ? ? ? ? ? ? ? ? ? ? ? ? PT Long Term Goals - 10/29/21 0845   ? ?  ? PT LONG TERM GOAL #1  ? Title  Patient will be independent with her HEP.   ? Time 4   ? Period Weeks   ? Status On-going   ? Target Date 11/22/21   ?  ? PT LONG TERM GOAL #2  ? Title Patient will be able to demonstrate at least 120 degrees of active left shoulder flexion.   ? Time 4   ? Period Weeks   ? Status On-going   ? Target Date 11/22/21   ?  ? PT LONG TERM GOAL #3  ? Title Patient will be able to carry at least 8 pounds with her left upper extremity without being limited by her left shoulder for improved function carrying her laundry.   ? Time 4   ? Period Weeks   ? Status On-going   ? Target Date 11/22/21   ?  ? PT LONG TERM GOAL #4  ? Title Patient will be able to demonstrate at least 120 degrees of active left shoulder abduction for improved function reaching overhead.   ?  Time 4   ? Period Weeks   ? Status On-going   ? Target Date 11/22/21   ? ?  ?  ? ?  ? ? ? ? ? ? ? ? Plan - 11/14/21 0859   ? ? Clinical Impression Statement Treatment focused on manual therapy for improved passive shoulder ROM with this being significantly effective as she was limited to approximately 90 degrees of shoulder flexion initially. However, this was able to be improved to approximately 130 degrees. She also experienced moderate tenderness to palpation to the infraspinatus initially, but this was able to be significantly reduced with soft tissue mobilization to the area. She reported feeling better after manual therapy. She continues to require skilled physical therapy to address her remaining impairments to return to her prior level of function.   ? Personal Factors and Comorbidities Other   ? Examination-Activity Limitations Reach Overhead;Carry;Lift   ? Stability/Clinical Decision Making Evolving/Moderate complexity   ? Rehab Potential Good   ? PT Frequency 3x / week   ? PT Duration 4 weeks   ? PT Treatment/Interventions Electrical Stimulation;Cryotherapy;Iontophoresis '4mg'$ /ml Dexamethasone;Moist Heat;Neuromuscular re-education;Therapeutic  exercise;Therapeutic activities;Patient/family education;Manual techniques;Passive range of motion;Taping;Vasopneumatic Device   ? PT Next Visit Plan Pendulums, PROM,  modalities as needed   ? Consulted and Agree with Plan of Care Patient   ? ?  ?  ? ?  ? ? ?Patient will benefit from skilled therapeutic intervention in order to improve the following deficits and impairments:  Decreased range of motion, Impaired UE functional use, Decreased activity tolerance, Pain, Hypomobility, Decreased strength ? ?Visit Diagnosis: ?Acute pain of left shoulder ? ?Stiffness of left shoulder, not elsewhere classified ? ? ? ? ?Problem List ?Patient Active Problem List  ? Diagnosis Date Noted  ? Low hemoglobin 11/15/2020  ? Controlled substance agreement signed 02/04/2019  ? Benzodiazepine dependence ([place]) 02/04/2019  ? Gastroesophageal reflux disease 02/04/2019  ? Vitamin D deficiency 11/13/2017  ? Hypothyroidism 11/13/2017  ? Hyperlipemia 11/13/2017  ? Insomnia 11/10/2017  ? History of OCD (obsessive compulsive disorder) 01/12/2014  ? GAD (generalized anxiety disorder) 01/12/2014  ? Inguinal lymphadenopathy 05/26/2012  ? ? ?Darlin Coco, PT ?11/14/2021, 1:30 PM ? ?[place] ?[facility] ?[place] ?[place], [place], [address] ?Phone: [phone]   Fax:  [phone] ? ?Name: Katherine Hays ?MRN: [id number] ?Date of Birth: 1971-01-14 ? ? ? ?

## 2021-11-16 ENCOUNTER — Ambulatory Visit: Payer: No Typology Code available for payment source | Admitting: *Deleted

## 2021-11-16 DIAGNOSIS — M25512 Pain in left shoulder: Secondary | ICD-10-CM

## 2021-11-16 DIAGNOSIS — M25612 Stiffness of left shoulder, not elsewhere classified: Secondary | ICD-10-CM

## 2021-11-16 DIAGNOSIS — G8929 Other chronic pain: Secondary | ICD-10-CM

## 2021-11-16 NOTE — Therapy (Signed)
[place] ?[facility] ?[place] ?[place], [place], [address] ?Phone: [phone]   Fax:  [phone] ? ?Physical Therapy Treatment ? ?Patient Details  ?Name: Katherine Hays ?MRN: [id number] ?Date of Birth: 02-07-1971 ?Referring Provider (PT): Doren Custard, PA-C ? ? ?Encounter Date: 11/16/2021 ? ? PT End of Session - 11/16/21 0858   ? ? Visit Number 9   ? Number of Visits 12   ? Date for PT Re-Evaluation 11/23/21   ? PT Start Time 0815   ? PT Stop Time 9211   ? PT Time Calculation (min) 50 min   ? ?  ?  ? ?  ? ? ?Past Medical History:  ?Diagnosis Date  ? Anxiety   ? Asthma   ? OCD (obsessive compulsive disorder)   ? ? ?Past Surgical History:  ?Procedure Laterality Date  ? TONSILLECTOMY    ? TUBAL LIGATION    ? WISDOM TOOTH EXTRACTION    ? ? ?There were no vitals filed for this visit. ? ? Subjective Assessment - 11/16/21 0857   ? ? Subjective Pt arrived doing fairly well, but reports increased soreness when waking this morning.   ? Pertinent History Hypothyroidism   ? Limitations House hold activities;Lifting   ? Patient Stated Goals reduced pain, carry laundry   ? Currently in Pain? Yes   ? Pain Score 3    ? Pain Location Shoulder   ? Pain Orientation Left   ? Pain Descriptors / Indicators Discomfort   ? Pain Type Surgical pain   ? ?  ?  ? ?  ? ? ? ? ? ? ? ? ? ? ? ? ? ? ? ? ? ? ? ? [place] Adult PT Treatment/Exercise - 11/16/21 0001   ? ?  ? Modalities  ? Modalities Vasopneumatic   ?  ? Vasopneumatic  ? Number Minutes Vasopneumatic  15 minutes   ? Vasopnuematic Location  Shoulder   ? Vasopneumatic Pressure Low   ? Vasopneumatic Temperature  34   ?  ? Manual Therapy  ? Manual Therapy Soft tissue mobilization;Passive ROM   ? Soft tissue mobilization left rotator cuff and biceps   ? Passive ROM PROM to LT shldr ER,IR flexion and abd to tolerance and did well. STW to incision for scar mobility.   ? ?  ?  ? ?  ? ? ? ? ? ? ? ? ? ? ? ? ? ? ? PT Long Term Goals - 10/29/21 0845   ? ?  ? PT LONG  TERM GOAL #1  ? Title Patient will be independent with her HEP.   ? Time 4   ? Period Weeks   ? Status On-going   ? Target Date 11/22/21   ?  ? PT LONG TERM GOAL #2  ? Title Patient will be able to demonstrate at least 120 degrees of active left shoulder flexion.   ? Time 4   ? Period Weeks   ? Status On-going   ? Target Date 11/22/21   ?  ? PT LONG TERM GOAL #3  ? Title Patient will be able to carry at least 8 pounds with her left upper extremity without being limited by her left shoulder for improved function carrying her laundry.   ? Time 4   ? Period Weeks   ? Status On-going   ? Target Date 11/22/21   ?  ? PT LONG TERM GOAL #4  ? Title Patient will be able to demonstrate at  least 120 degrees of active left shoulder abduction for improved function reaching overhead.   ? Time 4   ? Period Weeks   ? Status On-going   ? Target Date 11/22/21   ? ?  ?  ? ?  ? ? ? ? ? ? ? ? Plan - 11/16/21 0900   ? ? Clinical Impression Statement Pt arrived today reporting some increased soreness after waking this morning. She continues to do well with PROM and was able to reach 135 degrees flexion, ER to 45 degrees , and IR to abdomen. Vaso end of session   ? Personal Factors and Comorbidities Other   ? Examination-Activity Limitations Reach Overhead;Carry;Lift   ? Examination-Participation Restrictions Cleaning;Other;Occupation;Community Activity;Laundry   ? Stability/Clinical Decision Making Evolving/Moderate complexity   ? Rehab Potential Good   ? PT Frequency 3x / week   ? PT Duration 4 weeks   ? PT Treatment/Interventions Electrical Stimulation;Cryotherapy;Iontophoresis '4mg'$ /ml Dexamethasone;Moist Heat;Neuromuscular re-education;Therapeutic exercise;Therapeutic activities;Patient/family education;Manual techniques;Passive range of motion;Taping;Vasopneumatic Device   ? PT Next Visit Plan Pendulums, PROM,  modalities as needed   ? Consulted and Agree with Plan of Care Patient   ? ?  ?  ? ?  ? ? ?Patient will benefit from skilled  therapeutic intervention in order to improve the following deficits and impairments:  Decreased range of motion, Impaired UE functional use, Decreased activity tolerance, Pain, Hypomobility, Decreased strength ? ?Visit Diagnosis: ?Acute pain of left shoulder ? ?Stiffness of left shoulder, not elsewhere classified ? ?Chronic left shoulder pain ? ? ? ? ?Problem List ?Patient Active Problem List  ? Diagnosis Date Noted  ? Low hemoglobin 11/15/2020  ? Controlled substance agreement signed 02/04/2019  ? Benzodiazepine dependence ([place]) 02/04/2019  ? Gastroesophageal reflux disease 02/04/2019  ? Vitamin D deficiency 11/13/2017  ? Hypothyroidism 11/13/2017  ? Hyperlipemia 11/13/2017  ? Insomnia 11/10/2017  ? History of OCD (obsessive compulsive disorder) 01/12/2014  ? GAD (generalized anxiety disorder) 01/12/2014  ? Inguinal lymphadenopathy 05/26/2012  ? ? ?Camila Maita,CHRIS, PTA ?11/16/2021, 9:19 AM ? ?[place] ?[facility] ?[place] ?[place], [place], [address] ?Phone: [phone]   Fax:  [phone] ? ?Name: CHRISHA VOGEL ?MRN: [id number] ?Date of Birth: 07-Jun-1971 ? ? ? ?

## 2021-11-19 ENCOUNTER — Encounter: Payer: Self-pay | Admitting: Physical Therapy

## 2021-11-19 ENCOUNTER — Ambulatory Visit: Payer: No Typology Code available for payment source | Attending: Physician Assistant | Admitting: Physical Therapy

## 2021-11-19 DIAGNOSIS — M25512 Pain in left shoulder: Secondary | ICD-10-CM | POA: Insufficient documentation

## 2021-11-19 DIAGNOSIS — G8929 Other chronic pain: Secondary | ICD-10-CM | POA: Insufficient documentation

## 2021-11-19 DIAGNOSIS — M25612 Stiffness of left shoulder, not elsewhere classified: Secondary | ICD-10-CM | POA: Diagnosis present

## 2021-11-19 NOTE — Therapy (Addendum)
[place] ?[facility] ?[place] ?[place], [place], [address] ?Phone: [phone]   Fax:  [phone] ? ?Physical Therapy Treatment ? ?Patient Details  ?Name: Katherine Hays ?MRN: [id number] ?Date of Birth: [birth year] ?Referring Provider (PT): Doren Custard, PA-C ? ? ?Encounter Date: 11/19/2021 ? ? PT End of Session - 11/19/21 6269   ? ? Visit Number 10   ? Number of Visits 12   ? Date for PT Re-Evaluation 11/23/21   ? PT Start Time [phone]   ? PT Stop Time 0858   ? PT Time Calculation (min) 37 min   ? Activity Tolerance Patient tolerated treatment well   ? Behavior During Therapy [facility] for tasks assessed/performed   ? ?  ?  ? ?  ? ? ?Past Medical History:  ?Diagnosis Date  ? Anxiety   ? Asthma   ? OCD (obsessive compulsive disorder)   ? ? ?Past Surgical History:  ?Procedure Laterality Date  ? TONSILLECTOMY    ? TUBAL LIGATION    ? WISDOM TOOTH EXTRACTION    ? ? ?There were no vitals filed for this visit. ? ? Subjective Assessment - 11/19/21 0820   ? ? Subjective has more pain at night.   ? Pertinent History Hypothyroidism   ? Limitations House hold activities;Lifting   ? Patient Stated Goals reduced pain, carry laundry   ? Currently in Pain? No/denies   ? ?  ?  ? ?  ? ? ? ? ? [facility] PT Assessment - 11/19/21 0001   ? ?  ? Assessment  ? Medical Diagnosis Left shoulder scope   ? Referring Provider (PT) Doren Custard, PA-C   ? Onset Date/Surgical Date 10/22/21   ? Hand Dominance Right   ? Next MD Visit 11/2021   ? Prior Therapy Yes   ?  ? Precautions  ? Precautions Shoulder   ? ?  ?  ? ?  ? ? ? ? ? ? ? ? ? ? ? ? ? ? ? ? [place] Adult PT Treatment/Exercise - 11/19/21 0001   ? ?  ? Modalities  ? Modalities Vasopneumatic   ?  ? Vasopneumatic  ? Number Minutes Vasopneumatic  10 minutes   ? Vasopnuematic Location  Shoulder   ? Vasopneumatic Pressure Low   ? Vasopneumatic Temperature  34   ?  ? Manual Therapy  ? Manual Therapy Passive ROM   ? Passive ROM PROM of L shoulder into flex/ER/IR with gentle holds at end  range and oscillations   ? ?  ?  ? ?  ? ? ? ? ? ? ? ? ? ? ? ? ? ? ? PT Long Term Goals - 10/29/21 0845   ? ?  ? PT LONG TERM GOAL #1  ? Title Patient will be independent with her HEP.   ? Time 4   ? Period Weeks   ? Status On-going   ? Target Date 11/22/21   ?  ? PT LONG TERM GOAL #2  ? Title Patient will be able to demonstrate at least 120 degrees of active left shoulder flexion.   ? Time 4   ? Period Weeks   ? Status On-going   ? Target Date 11/22/21   ?  ? PT LONG TERM GOAL #3  ? Title Patient will be able to carry at least 8 pounds with her left upper extremity without being limited by her left shoulder for improved function carrying her laundry.   ? Time 4   ?  Period Weeks   ? Status On-going   ? Target Date 11/22/21   ?  ? PT LONG TERM GOAL #4  ? Title Patient will be able to demonstrate at least 120 degrees of active left shoulder abduction for improved function reaching overhead.   ? Time 4   ? Period Weeks   ? Status On-going   ? Target Date 11/22/21   ? ?  ?  ? ?  ? ? ? ? ? ? ? ? Plan - 11/19/21 0906   ? ? Clinical Impression Statement Patient presented in clinic with reports of more pain in L shoulder at night as she is sleeping on the couch. Patient able to tolerate PROM fair with more facial grimacing and resistance with PROM flexion. Empty end feel noted with PROM flexion and firm end feel with PROM ER. Intermittant oscillations provided throughout PROM session to assist with guarding reduction and pain reduction. Normal vasopneumatic response noted following removal of the modality. Patient was reporting difficulty in washing under her arms in which she was instructed with flexion walk away technique with shelf in shower.   ? Personal Factors and Comorbidities Other   ? Examination-Activity Limitations Reach Overhead;Carry;Lift   ? Examination-Participation Restrictions Cleaning;Other;Occupation;Community Activity;Laundry   ? Stability/Clinical Decision Making Evolving/Moderate complexity   ? Rehab  Potential Good   ? PT Frequency 3x / week   ? PT Duration 4 weeks   ? PT Treatment/Interventions Electrical Stimulation;Cryotherapy;Iontophoresis '4mg'$ /ml Dexamethasone;Moist Heat;Neuromuscular re-education;Therapeutic exercise;Therapeutic activities;Patient/family education;Manual techniques;Passive range of motion;Taping;Vasopneumatic Device   ? PT Next Visit Plan Pendulums, PROM,  modalities as needed   ? Consulted and Agree with Plan of Care Patient   ? ?  ?  ? ?  ? ? ?Patient will benefit from skilled therapeutic intervention in order to improve the following deficits and impairments:  Decreased range of motion, Impaired UE functional use, Decreased activity tolerance, Pain, Hypomobility, Decreased strength ? ?Visit Diagnosis: ?Acute pain of left shoulder ? ?Stiffness of left shoulder, not elsewhere classified ? ? ? ? ?Problem List ?Patient Active Problem List  ? Diagnosis Date Noted  ? Low hemoglobin 11/15/2020  ? Controlled substance agreement signed 02/04/2019  ? Benzodiazepine dependence ([place]) 02/04/2019  ? Gastroesophageal reflux disease 02/04/2019  ? Vitamin D deficiency 11/13/2017  ? Hypothyroidism 11/13/2017  ? Hyperlipemia 11/13/2017  ? Insomnia 11/10/2017  ? History of OCD (obsessive compulsive disorder) 01/12/2014  ? GAD (generalized anxiety disorder) 01/12/2014  ? Inguinal lymphadenopathy 05/26/2012  ? ? ?Standley Brooking, PTA ?11/19/2021, 9:09 AM ? ?[place] ?[facility] ?[place] ?[place], [place], [address] ?Phone: [phone]   Fax:  [phone] ? ?Name: Katherine Hays ?MRN: [id number] ?Date of Birth: September 08, 1970 ? ?Progress Note ?Reporting Period 10/25/21 to 11/19/21 ? ?See note below for Objective Data and Assessment of Progress/Goals.  ? ?Patient is making appropriate progress, but remains limited by her surgical condition. Recommend that she continue with her current plan of care to address her remaining impairments to return to her prior level of function.   ? ?Jacqulynn Cadet, PT, DPT  ? ?

## 2021-11-22 ENCOUNTER — Ambulatory Visit: Payer: No Typology Code available for payment source | Admitting: *Deleted

## 2021-11-22 DIAGNOSIS — M25512 Pain in left shoulder: Secondary | ICD-10-CM | POA: Diagnosis not present

## 2021-11-22 DIAGNOSIS — G8929 Other chronic pain: Secondary | ICD-10-CM

## 2021-11-22 DIAGNOSIS — M25612 Stiffness of left shoulder, not elsewhere classified: Secondary | ICD-10-CM

## 2021-11-22 NOTE — Therapy (Signed)
[place] ?[facility] ?[place] ?[place], [place], [address] ?Phone: [phone]   Fax:  [phone] ? ?Physical Therapy Treatment ? ?Patient Details  ?Name: Katherine Hays ?MRN: [id number] ?Date of Birth: [birth year] ?Referring Provider (PT): Doren Custard, PA-C ? ? ?Encounter Date: 11/22/2021 ? ? PT End of Session - 11/22/21 0817   ? ? Visit Number 11   ? Number of Visits 24   ? Date for PT Re-Evaluation 01/11/22   ? PT Start Time 0815   ? PT Stop Time 0906   ? PT Time Calculation (min) 51 min   ? ?  ?  ? ?  ? ? ?Past Medical History:  ?Diagnosis Date  ? Anxiety   ? Asthma   ? OCD (obsessive compulsive disorder)   ? ? ?Past Surgical History:  ?Procedure Laterality Date  ? TONSILLECTOMY    ? TUBAL LIGATION    ? WISDOM TOOTH EXTRACTION    ? ? ?There were no vitals filed for this visit. ? ? Subjective Assessment - 11/22/21 0814   ? ? Subjective LT shldr is stiff and sore. See MD in a couple weeks on the 19th   ? Pertinent History Hypothyroidism   ? Limitations House hold activities;Lifting   ? Patient Stated Goals reduced pain, carry laundry   ? Currently in Pain? Yes   ? Pain Score 2    ? Pain Location Shoulder   ? Pain Orientation Left   ? Pain Descriptors / Indicators Sore;Tightness   ? Pain Type Surgical pain   ? ?  ?  ? ?  ? ? ? ? ? ? ? ? ? ? ? ? ? ? ? ? ? ? ? ? [place] Adult PT Treatment/Exercise - 11/22/21 0001   ? ?  ? Modalities  ? Modalities Vasopneumatic   ?  ? Vasopneumatic  ? Number Minutes Vasopneumatic  15 minutes   ? Vasopnuematic Location  Shoulder   ? Vasopneumatic Pressure Low   ? Vasopneumatic Temperature  34edema   ?  ? Manual Therapy  ? Manual Therapy Passive ROM   ? Manual therapy comments ER to 45 degrees, Flexion to 130 degrees   ? Passive ROM PROM of L shoulder into flex/ER/IR with gentle holds at end range and oscillations   ? ?  ?  ? ?  ? ? ? ? ? ? ? ? ? ? ? ? ? ? ? PT Long Term Goals - 10/29/21 0845   ? ?  ? PT LONG TERM GOAL #1  ? Title Patient will be  independent with her HEP.   ? Time 4   ? Period Weeks   ? Status On-going   ? Target Date 11/22/21   ?  ? PT LONG TERM GOAL #2  ? Title Patient will be able to demonstrate at least 120 degrees of active left shoulder flexion.   ? Time 4   ? Period Weeks   ? Status On-going   ? Target Date 11/22/21   ?  ? PT LONG TERM GOAL #3  ? Title Patient will be able to carry at least 8 pounds with her left upper extremity without being limited by her left shoulder for improved function carrying her laundry.   ? Time 4   ? Period Weeks   ? Status On-going   ? Target Date 11/22/21   ?  ? PT LONG TERM GOAL #4  ? Title Patient will be able to demonstrate at least 120  degrees of active left shoulder abduction for improved function reaching overhead.   ? Time 4   ? Period Weeks   ? Status On-going   ? Target Date 11/22/21   ? ?  ?  ? ?  ? ? ? ? ? ? ? ? Plan - 11/22/21 0911   ? ? Clinical Impression Statement Pt arrived today doing fairly well with LT shldr. PROM performed LT shldr for ER to 45 degrees, Flexion to 130 degrees. Rx focused on ER witch has a firmer end-feel. Reviewed ER stretch for HEP. Vaso end of session   ? Personal Factors and Comorbidities Other   ? Examination-Activity Limitations Reach Overhead;Carry;Lift   ? Examination-Participation Restrictions Cleaning;Other;Occupation;Community Activity;Laundry   ? Stability/Clinical Decision Making Evolving/Moderate complexity   ? Rehab Potential Good   ? PT Frequency 3x / week   ? PT Duration 4 weeks   ? PT Treatment/Interventions Electrical Stimulation;Cryotherapy;Iontophoresis '4mg'$ /ml Dexamethasone;Moist Heat;Neuromuscular re-education;Therapeutic exercise;Therapeutic activities;Patient/family education;Manual techniques;Passive range of motion;Taping;Vasopneumatic Device   ? PT Next Visit Plan Pendulums, PROM,  modalities as needed   Recert   ? Consulted and Agree with Plan of Care Patient   ? ?  ?  ? ?  ? ? ?Patient will benefit from skilled therapeutic intervention in  order to improve the following deficits and impairments:  Decreased range of motion, Impaired UE functional use, Decreased activity tolerance, Pain, Hypomobility, Decreased strength ? ?Visit Diagnosis: ?Acute pain of left shoulder ? ?Stiffness of left shoulder, not elsewhere classified ? ?Chronic left shoulder pain ? ? ? ? ?Problem List ?Patient Active Problem List  ? Diagnosis Date Noted  ? Low hemoglobin 11/15/2020  ? Controlled substance agreement signed 02/04/2019  ? Benzodiazepine dependence ([place]) 02/04/2019  ? Gastroesophageal reflux disease 02/04/2019  ? Vitamin D deficiency 11/13/2017  ? Hypothyroidism 11/13/2017  ? Hyperlipemia 11/13/2017  ? Insomnia 11/10/2017  ? History of OCD (obsessive compulsive disorder) 01/12/2014  ? GAD (generalized anxiety disorder) 01/12/2014  ? Inguinal lymphadenopathy 05/26/2012  ? ? ?Chloeanne Poteet,CHRIS, PTA ?11/22/2021, 3:34 PM ? ?[place] ?[facility] ?[place] ?[place], [place], [address] ?Phone: [phone]   Fax:  [phone] ? ?Name: Katherine Hays ?MRN: [id number] ?Date of Birth: 03-06-1971 ? ? ? ?

## 2021-11-26 ENCOUNTER — Ambulatory Visit: Payer: No Typology Code available for payment source | Admitting: *Deleted

## 2021-11-26 DIAGNOSIS — M25512 Pain in left shoulder: Secondary | ICD-10-CM

## 2021-11-26 DIAGNOSIS — G8929 Other chronic pain: Secondary | ICD-10-CM

## 2021-11-26 DIAGNOSIS — M25612 Stiffness of left shoulder, not elsewhere classified: Secondary | ICD-10-CM

## 2021-11-26 NOTE — Therapy (Signed)
[place] ?[facility] ?[place] ?[place], [place], [address] ?Phone: [phone]   Fax:  [phone] ? ?Physical Therapy Treatment ? ?Patient Details  ?Name: Katherine Hays ?MRN: [id number] ?Date of Birth: Apr 17, 1971 ?Referring Provider (PT): Doren Custard, PA-C ? ? ?Encounter Date: 11/26/2021 ? ? PT End of Session - 11/26/21 0834   ? ? Visit Number 12   ? Number of Visits 24   ? Date for PT Re-Evaluation 01/11/22   ? PT Start Time [phone]   ? PT Stop Time 7902   ? PT Time Calculation (min) 51 min   ? ?  ?  ? ?  ? ? ?Past Medical History:  ?Diagnosis Date  ? Anxiety   ? Asthma   ? OCD (obsessive compulsive disorder)   ? ? ?Past Surgical History:  ?Procedure Laterality Date  ? TONSILLECTOMY    ? TUBAL LIGATION    ? WISDOM TOOTH EXTRACTION    ? ? ?There were no vitals filed for this visit. ? ? Subjective Assessment - 11/26/21 0835   ? ? Subjective LT shldr is stiff and sore. See MD in a couple weeks on the 19th. Working on ER   ? Pertinent History Hypothyroidism   ? Limitations House hold activities;Lifting   ? Patient Stated Goals reduced pain, carry laundry   ? Currently in Pain? Yes   ? Pain Score 2    ? Pain Location Shoulder   ? Pain Orientation Left   ? Pain Descriptors / Indicators Sore;Tightness   ? Pain Type Surgical pain   ? ?  ?  ? ?  ? ? ? ? ? ? ? ? ? ? ? ? ? ? ? ? ? ? ? ? [place] Adult PT Treatment/Exercise - 11/26/21 0001   ? ?  ? Modalities  ? Modalities Vasopneumatic   ?  ? Vasopneumatic  ? Number Minutes Vasopneumatic  15 minutes   ? Vasopnuematic Location  Shoulder   ? Vasopneumatic Pressure Low   ? Vasopneumatic Temperature  34edema   ?  ? Manual Therapy  ? Manual Therapy Passive ROM   ? Manual therapy comments ER to 55 degrees, Flexion to 150 degrees   ? Joint Mobilization Grade I-II inferior and posterior glenohumeral   ? Passive ROM PROM of L shoulder into flex/ER/IR with gentle holds at end range and oscillations   ? ?  ?  ? ?  ? ? ? ? ? ? ? ? ? ? ? ? ? ? ? PT Long Term  Goals - 10/29/21 0845   ? ?  ? PT LONG TERM GOAL #1  ? Title Patient will be independent with her HEP.   ? Time 4   ? Period Weeks   ? Status On-going   ? Target Date 11/22/21   ?  ? PT LONG TERM GOAL #2  ? Title Patient will be able to demonstrate at least 120 degrees of active left shoulder flexion.   ? Time 4   ? Period Weeks   ? Status On-going   ? Target Date 11/22/21   ?  ? PT LONG TERM GOAL #3  ? Title Patient will be able to carry at least 8 pounds with her left upper extremity without being limited by her left shoulder for improved function carrying her laundry.   ? Time 4   ? Period Weeks   ? Status On-going   ? Target Date 11/22/21   ?  ? PT LONG TERM  GOAL #4  ? Title Patient will be able to demonstrate at least 120 degrees of active left shoulder abduction for improved function reaching overhead.   ? Time 4   ? Period Weeks   ? Status On-going   ? Target Date 11/22/21   ? ?  ?  ? ?  ? ? ? ? ? ? ? ? Plan - 11/26/21 0901   ? ? Clinical Impression Statement Pt arrived today with normal stiffness/soreness LT shldr. PROM and gentle mobs performed with progression in flexion to 150 degrees and ER to 55 degrees today. Pt does well with v/c's to relax and deep breathing.   ? Personal Factors and Comorbidities Other   ? Examination-Activity Limitations Reach Overhead;Carry;Lift   ? Examination-Participation Restrictions Cleaning;Other;Occupation;Community Activity;Laundry   ? PT Treatment/Interventions Electrical Stimulation;Cryotherapy;Iontophoresis '4mg'$ /ml Dexamethasone;Moist Heat;Neuromuscular re-education;Therapeutic exercise;Therapeutic activities;Patient/family education;Manual techniques;Passive range of motion;Taping;Vasopneumatic Device   ? PT Next Visit Plan Pendulums, PROM,  modalities as needed   ? Consulted and Agree with Plan of Care Patient   ? ?  ?  ? ?  ? ? ?Patient will benefit from skilled therapeutic intervention in order to improve the following deficits and impairments:  Decreased range of  motion, Impaired UE functional use, Decreased activity tolerance, Pain, Hypomobility, Decreased strength ? ?Visit Diagnosis: ?Acute pain of left shoulder ? ?Stiffness of left shoulder, not elsewhere classified ? ?Chronic left shoulder pain ? ? ? ? ?Problem List ?Patient Active Problem List  ? Diagnosis Date Noted  ? Low hemoglobin 11/15/2020  ? Controlled substance agreement signed 02/04/2019  ? Benzodiazepine dependence ([place]) 02/04/2019  ? Gastroesophageal reflux disease 02/04/2019  ? Vitamin D deficiency 11/13/2017  ? Hypothyroidism 11/13/2017  ? Hyperlipemia 11/13/2017  ? Insomnia 11/10/2017  ? History of OCD (obsessive compulsive disorder) 01/12/2014  ? GAD (generalized anxiety disorder) 01/12/2014  ? Inguinal lymphadenopathy 05/26/2012  ? ? ?Sandie Swayze,CHRIS, PTA ?11/26/2021, 9:16 AM ? ?[place] ?[facility] ?[place] ?[place], [place], [address] ?Phone: [phone]   Fax:  [phone] ? ?Name: SALVADOR BIGBEE ?MRN: [id number] ?Date of Birth: Dec 09, 1970 ? ? ? ?

## 2021-11-28 ENCOUNTER — Ambulatory Visit: Payer: No Typology Code available for payment source

## 2021-11-28 DIAGNOSIS — M25612 Stiffness of left shoulder, not elsewhere classified: Secondary | ICD-10-CM

## 2021-11-28 DIAGNOSIS — M25512 Pain in left shoulder: Secondary | ICD-10-CM

## 2021-11-28 NOTE — Therapy (Signed)
[place] ?[facility] ?[address] ?[place], [place], [address] ?Phone: [phone]   Fax:  [phone] ? ?Physical Therapy Treatment ? ?Patient Details  ?Name: Katherine Hays ?MRN: [id number] ?Date of Birth: 1971-01-26 ?Referring Provider (PT): Doren Custard, PA-C ? ? ?Encounter Date: 11/28/2021 ? ? PT End of Session - 11/28/21 0819   ? ? Visit Number 13   ? Number of Visits 24   ? Date for PT Re-Evaluation 01/11/22   ? PT Start Time [phone]   ? PT Stop Time 7591   ? PT Time Calculation (min) 59 min   ? Activity Tolerance Patient tolerated treatment well   ? Behavior During Therapy [facility] for tasks assessed/performed   ? ?  ?  ? ?  ? ? ?Past Medical History:  ?Diagnosis Date  ? Anxiety   ? Asthma   ? OCD (obsessive compulsive disorder)   ? ? ?Past Surgical History:  ?Procedure Laterality Date  ? TONSILLECTOMY    ? TUBAL LIGATION    ? WISDOM TOOTH EXTRACTION    ? ? ?There were no vitals filed for this visit. ? ? Subjective Assessment - 11/28/21 0817   ? ? Subjective Patient reports that her shoulder is not hurting right now, but it typically hurts at night.   ? Pertinent History Hypothyroidism   ? Limitations House hold activities;Lifting   ? Patient Stated Goals reduced pain, carry laundry   ? Currently in Pain? No/denies   ? ?  ?  ? ?  ? ? ? ? ? ? ? ? ? ? ? ? ? ? ? ? ? ? ? ? [place] Adult PT Treatment/Exercise - 11/28/21 0001   ? ?  ? Shoulder Exercises: Seated  ? External Rotation PROM;Left;20 reps   using cane; 3 minutes  ?  ? Shoulder Exercises: Standing  ? Retraction Both;20 reps   ? Retraction Weight (lbs) Blue XTS   ?  ? Shoulder Exercises: Pulleys  ? Flexion 3 minutes   L shoulder PROM  ?  ? Shoulder Exercises: Stretch  ? Table Stretch - Flexion Other (comment)   3 minutes; brief hold  ?  ? Modalities  ? Modalities Vasopneumatic   ?  ? Vasopneumatic  ? Number Minutes Vasopneumatic  15 minutes   ? Vasopnuematic Location  Shoulder   ? Vasopneumatic Pressure Low   ? Vasopneumatic  Temperature  34   ?  ? Manual Therapy  ? Manual Therapy Soft tissue mobilization;Joint mobilization;Passive ROM   ? Joint Mobilization Grade I-II inferior and posterior glenohumeral   ? Soft tissue mobilization left rotator cuff and biceps   ? Passive ROM PROM of L shoulder into flex/ER/IR with gentle holds at end range and oscillations   ? ?  ?  ? ?  ? ? ? ? ? ? ? ? ? ? ? ? ? ? ? PT Long Term Goals - 10/29/21 0845   ? ?  ? PT LONG TERM GOAL #1  ? Title Patient will be independent with her HEP.   ? Time 4   ? Period Weeks   ? Status On-going   ? Target Date 11/22/21   ?  ? PT LONG TERM GOAL #2  ? Title Patient will be able to demonstrate at least 120 degrees of active left shoulder flexion.   ? Time 4   ? Period Weeks   ? Status On-going   ? Target Date 11/22/21   ?  ? PT LONG TERM GOAL #  3  ? Title Patient will be able to carry at least 8 pounds with her left upper extremity without being limited by her left shoulder for improved function carrying her laundry.   ? Time 4   ? Period Weeks   ? Status On-going   ? Target Date 11/22/21   ?  ? PT LONG TERM GOAL #4  ? Title Patient will be able to demonstrate at least 120 degrees of active left shoulder abduction for improved function reaching overhead.   ? Time 4   ? Period Weeks   ? Status On-going   ? Target Date 11/22/21   ? ?  ?  ? ?  ? ? ? ? ? ? ? ? Plan - 11/28/21 0819   ? ? Clinical Impression Statement Patient was introduced to light PROM interventions for improved shoulder mobility prior to manual therapy. She experienced a mild increase in left shoulder discomfort with these interventions initially, but this along with her ROM quickly improved as she continued with these interventions. Manual therapy focused on soft tissue mobilization to the rotator cuff followed by PROM for improved shoulder mobility. She reported feeling alright upon the conclusion of these interventions. She continues to require skilled physical therapy to address her remaining impairments  to return to her prior level of function.   ? Personal Factors and Comorbidities Other   ? Examination-Activity Limitations Reach Overhead;Carry;Lift   ? Examination-Participation Restrictions Cleaning;Other;Occupation;Community Activity;Laundry   ? PT Treatment/Interventions Electrical Stimulation;Cryotherapy;Iontophoresis '4mg'$ /ml Dexamethasone;Moist Heat;Neuromuscular re-education;Therapeutic exercise;Therapeutic activities;Patient/family education;Manual techniques;Passive range of motion;Taping;Vasopneumatic Device   ? PT Next Visit Plan Pendulums, PROM,  modalities as needed   ? Consulted and Agree with Plan of Care Patient   ? ?  ?  ? ?  ? ? ?Patient will benefit from skilled therapeutic intervention in order to improve the following deficits and impairments:  Decreased range of motion, Impaired UE functional use, Decreased activity tolerance, Pain, Hypomobility, Decreased strength ? ?Visit Diagnosis: ?Acute pain of left shoulder ? ?Stiffness of left shoulder, not elsewhere classified ? ? ? ? ?Problem List ?Patient Active Problem List  ? Diagnosis Date Noted  ? Low hemoglobin 11/15/2020  ? Controlled substance agreement signed 02/04/2019  ? Benzodiazepine dependence ([place]) 02/04/2019  ? Gastroesophageal reflux disease 02/04/2019  ? Vitamin D deficiency 11/13/2017  ? Hypothyroidism 11/13/2017  ? Hyperlipemia 11/13/2017  ? Insomnia 11/10/2017  ? History of OCD (obsessive compulsive disorder) 01/12/2014  ? GAD (generalized anxiety disorder) 01/12/2014  ? Inguinal lymphadenopathy 05/26/2012  ? ? ?Darlin Coco, PT ?11/28/2021, 12:56 PM ? ?[place] ?[facility] ?[address] ?[place], [place], [address] ?Phone: [phone]   Fax:  [phone] ? ?Name: Katherine Hays ?MRN: [id number] ?Date of Birth: August 14, 1971 ? ? ? ?

## 2021-11-30 ENCOUNTER — Encounter: Payer: Self-pay | Admitting: Physical Therapy

## 2021-11-30 ENCOUNTER — Ambulatory Visit: Payer: No Typology Code available for payment source | Admitting: Physical Therapy

## 2021-11-30 DIAGNOSIS — M25512 Pain in left shoulder: Secondary | ICD-10-CM | POA: Diagnosis not present

## 2021-11-30 DIAGNOSIS — M25612 Stiffness of left shoulder, not elsewhere classified: Secondary | ICD-10-CM

## 2021-11-30 NOTE — Therapy (Signed)
[place] ?[facility] ?[address] ?[place], [place], [address] ?Phone: [phone]   Fax:  [phone] ? ?Physical Therapy Treatment ? ?Patient Details  ?Name: Katherine Hays ?MRN: [id number] ?Date of Birth: 1970-10-24 ?Referring Provider (PT): Doren Custard, PA-C ? ? ?Encounter Date: 11/30/2021 ? ? PT End of Session - 11/30/21 0820   ? ? Visit Number 14   ? Number of Visits 24   ? Date for PT Re-Evaluation 01/11/22   ? PT Start Time [phone]   ? PT Stop Time 0901   ? PT Time Calculation (min) 40 min   ? Activity Tolerance Patient tolerated treatment well   ? Behavior During Therapy [facility] for tasks assessed/performed   ? ?  ?  ? ?  ? ? ?Past Medical History:  ?Diagnosis Date  ? Anxiety   ? Asthma   ? OCD (obsessive compulsive disorder)   ? ? ?Past Surgical History:  ?Procedure Laterality Date  ? TONSILLECTOMY    ? TUBAL LIGATION    ? WISDOM TOOTH EXTRACTION    ? ? ?There were no vitals filed for this visit. ? ? Subjective Assessment - 11/30/21 0818   ? ? Subjective Reports pain the last two nights down the anterior arm along bicep.   ? Pertinent History Hypothyroidism   ? Limitations House hold activities;Lifting   ? Patient Stated Goals reduced pain, carry laundry   ? Currently in Pain? Yes   ? Pain Score 2    ? Pain Location Shoulder   ? Pain Orientation Left;Anterior   ? Pain Descriptors / Indicators Discomfort;Sore   ? Pain Type Surgical pain   ? Pain Onset 1 to 4 weeks ago   ? Pain Frequency Constant   ? ?  ?  ? ?  ? ? ? ? ? [facility] PT Assessment - 11/30/21 0001   ? ?  ? Assessment  ? Medical Diagnosis Left shoulder scope   ? Referring Provider (PT) Doren Custard, PA-C   ? Onset Date/Surgical Date 10/22/21   ? Hand Dominance Right   ? Next MD Visit 11/2021   ? Prior Therapy Yes   ?  ? Precautions  ? Precautions Shoulder   ? ?  ?  ? ?  ? ? ? ? ? ? ? ? ? ? ? ? ? ? ? ? [place] Adult PT Treatment/Exercise - 11/30/21 0001   ? ?  ? Modalities  ? Modalities Vasopneumatic   ?  ? Vasopneumatic  ? Number Minutes  Vasopneumatic  10 minutes   ? Vasopnuematic Location  Shoulder   ? Vasopneumatic Pressure Low   ? Vasopneumatic Temperature  34   ?  ? Manual Therapy  ? Manual Therapy Passive ROM   ? Passive ROM PROM of L shoulder into flex/ER/IR with gentle holds at end range and oscillations   ? ?  ?  ? ?  ? ? ? ? ? ? ? ? ? ? ? ? ? ? ? PT Long Term Goals - 10/29/21 0845   ? ?  ? PT LONG TERM GOAL #1  ? Title Patient will be independent with her HEP.   ? Time 4   ? Period Weeks   ? Status On-going   ? Target Date 11/22/21   ?  ? PT LONG TERM GOAL #2  ? Title Patient will be able to demonstrate at least 120 degrees of active left shoulder flexion.   ? Time 4   ? Period Weeks   ?  Status On-going   ? Target Date 11/22/21   ?  ? PT LONG TERM GOAL #3  ? Title Patient will be able to carry at least 8 pounds with her left upper extremity without being limited by her left shoulder for improved function carrying her laundry.   ? Time 4   ? Period Weeks   ? Status On-going   ? Target Date 11/22/21   ?  ? PT LONG TERM GOAL #4  ? Title Patient will be able to demonstrate at least 120 degrees of active left shoulder abduction for improved function reaching overhead.   ? Time 4   ? Period Weeks   ? Status On-going   ? Target Date 11/22/21   ? ?  ?  ? ?  ? ? ? ? ? ? ? ? Plan - 11/30/21 0854   ? ? Clinical Impression Statement Patient arrived with reports of more stretched sensation in anterior LUE last night. Patient guided through light PROM with light pressure at end range to control pain today. Firm end feels and smooth arc of motion noted during PROM in all directions with minimal facial grimacing noted. Normal vasopneumatic response noted following removal of the modality.   ? Personal Factors and Comorbidities Other   ? Examination-Activity Limitations Reach Overhead;Carry;Lift   ? Examination-Participation Restrictions Cleaning;Other;Occupation;Community Activity;Laundry   ? Stability/Clinical Decision Making Evolving/Moderate complexity    ? Rehab Potential Good   ? PT Frequency 3x / week   ? PT Duration 4 weeks   ? PT Treatment/Interventions Electrical Stimulation;Cryotherapy;Iontophoresis '4mg'$ /ml Dexamethasone;Moist Heat;Neuromuscular re-education;Therapeutic exercise;Therapeutic activities;Patient/family education;Manual techniques;Passive range of motion;Taping;Vasopneumatic Device   ? PT Next Visit Plan Pendulums, PROM,  modalities as needed   ? Consulted and Agree with Plan of Care Patient   ? ?  ?  ? ?  ? ? ?Patient will benefit from skilled therapeutic intervention in order to improve the following deficits and impairments:  Decreased range of motion, Impaired UE functional use, Decreased activity tolerance, Pain, Hypomobility, Decreased strength ? ?Visit Diagnosis: ?Acute pain of left shoulder ? ?Stiffness of left shoulder, not elsewhere classified ? ? ? ? ?Problem List ?Patient Active Problem List  ? Diagnosis Date Noted  ? Low hemoglobin 11/15/2020  ? Controlled substance agreement signed 02/04/2019  ? Benzodiazepine dependence ([place]) 02/04/2019  ? Gastroesophageal reflux disease 02/04/2019  ? Vitamin D deficiency 11/13/2017  ? Hypothyroidism 11/13/2017  ? Hyperlipemia 11/13/2017  ? Insomnia 11/10/2017  ? History of OCD (obsessive compulsive disorder) 01/12/2014  ? GAD (generalized anxiety disorder) 01/12/2014  ? Inguinal lymphadenopathy 05/26/2012  ? ? ?Standley Brooking, PTA ?11/30/2021, 9:23 AM ? ?[place] ?[facility] ?[address] ?[place], [place], [address] ?Phone: [phone]   Fax:  [phone] ? ?Name: Katherine Hays ?MRN: [id number] ?Date of Birth: 1971/05/05 ? ? ? ?

## 2021-12-03 ENCOUNTER — Ambulatory Visit: Payer: No Typology Code available for payment source

## 2021-12-03 DIAGNOSIS — M25512 Pain in left shoulder: Secondary | ICD-10-CM

## 2021-12-03 DIAGNOSIS — M25612 Stiffness of left shoulder, not elsewhere classified: Secondary | ICD-10-CM

## 2021-12-03 NOTE — Therapy (Signed)
[place] ?[facility] ?[place] ?[place], [place], [address] ?Phone: [phone]   Fax:  [phone] ? ?Physical Therapy Treatment ? ?Patient Details  ?Name: Katherine Hays ?MRN: [id number] ?Date of Birth: 1971-08-08 ?Referring Provider (PT): Doren Custard, PA-C ? ? ?Encounter Date: 12/03/2021 ? ? PT End of Session - 12/03/21 0817   ? ? Visit Number 15   ? Number of Visits 24   ? Date for PT Re-Evaluation 01/11/22   ? PT Start Time 0813   ? PT Stop Time 6962   ? PT Time Calculation (min) 66 min   ? Activity Tolerance Patient tolerated treatment well   ? Behavior During Therapy [facility] for tasks assessed/performed   ? ?  ?  ? ?  ? ? ?Past Medical History:  ?Diagnosis Date  ? Anxiety   ? Asthma   ? OCD (obsessive compulsive disorder)   ? ? ?Past Surgical History:  ?Procedure Laterality Date  ? TONSILLECTOMY    ? TUBAL LIGATION    ? WISDOM TOOTH EXTRACTION    ? ? ?There were no vitals filed for this visit. ? ? Subjective Assessment - 12/03/21 0814   ? ? Subjective Patient reports that the left side of her neck is hurting today. She notes that this started around Saturday. She notes she had a neck problem previously and it will flare up occasionally.   ? Pertinent History Hypothyroidism   ? Limitations House hold activities;Lifting   ? Patient Stated Goals reduced pain, carry laundry   ? Currently in Pain? Yes   ? Pain Score 2    ? Pain Location Neck   ? Pain Orientation Left   ? Pain Onset 1 to 4 weeks ago   ? ?  ?  ? ?  ? ? ? ? ? ? ? ? ? ? ? ? ? ? ? ? ? ? ? ? [place] Adult PT Treatment/Exercise - 12/03/21 0001   ? ?  ? Shoulder Exercises: Seated  ? Other Seated Exercises Scapular circles   forward and backward; 20 reps each  ?  ? Shoulder Exercises: Pulleys  ? Flexion 2 minutes   ?  ? Shoulder Exercises: Isometric Strengthening  ? Flexion Other (comment)   standing; 20 reps; 5 second hold; 50% max  ? ADduction Other (comment)   standing; 20 reps; 5 second hold; 50% max  ?  ? Shoulder  Exercises: Stretch  ? Other Shoulder Stretches Upper trapezius   Left; 4x30"  ? Other Shoulder Stretches Levator scap   Left: 4x30"  ?  ? Modalities  ? Modalities Electrical Stimulation;Vasopneumatic   ?  ? Electrical Stimulation  ? Electrical Stimulation Location Left UT   no redness or adverse reaction to today's modalities  ? Electrical Stimulation Action pre mod   ? Electrical Stimulation Parameters 80-150 Hz x 15 minutes   ? Electrical Stimulation Goals Tone;Pain   ?  ? Vasopneumatic  ? Number Minutes Vasopneumatic  15 minutes   ? Vasopnuematic Location  Shoulder   ? Vasopneumatic Pressure Low   ? Vasopneumatic Temperature  34   ?  ? Manual Therapy  ? Manual Therapy Joint mobilization;Soft tissue mobilization;Passive ROM   ? Joint Mobilization Grade I-III inferior and posterior glenohumeral   ? Soft tissue mobilization left rotator cuff, UT, and biceps   ? Passive ROM PROM of L shoulder into flex/ER/IR with gentle holds at end range and oscillations   ? ?  ?  ? ?  ? ? ? ? ? ? ? ? ? ? ? ? ? ? ?  PT Long Term Goals - 10/29/21 0845   ? ?  ? PT LONG TERM GOAL #1  ? Title Patient will be independent with her HEP.   ? Time 4   ? Period Weeks   ? Status On-going   ? Target Date 11/22/21   ?  ? PT LONG TERM GOAL #2  ? Title Patient will be able to demonstrate at least 120 degrees of active left shoulder flexion.   ? Time 4   ? Period Weeks   ? Status On-going   ? Target Date 11/22/21   ?  ? PT LONG TERM GOAL #3  ? Title Patient will be able to carry at least 8 pounds with her left upper extremity without being limited by her left shoulder for improved function carrying her laundry.   ? Time 4   ? Period Weeks   ? Status On-going   ? Target Date 11/22/21   ?  ? PT LONG TERM GOAL #4  ? Title Patient will be able to demonstrate at least 120 degrees of active left shoulder abduction for improved function reaching overhead.   ? Time 4   ? Period Weeks   ? Status On-going   ? Target Date 11/22/21   ? ?  ?  ? ?   ? ? ? ? ? ? ? ? Plan - 12/03/21 0817   ? ? Clinical Impression Statement Patient presented to treatment with left sided cervical pain and discomfort along left upper trapezius. This was able to to be slightly reduced with manual therapy and approprately matched interventions. However, electrical stimulation to the left upper trapezius was the the most effective at reducing her symptoms. Manual therapy focused on soft tissue mobilization to the left upper trapezius and rotator cuff followed by PROM for improved shoulder mobility. She reported feeling better upon the conclusion of treatment. She continues to require skilled physical therapy to address her remaining impairments to return to her prior level of function.   ? Personal Factors and Comorbidities Other   ? Examination-Activity Limitations Reach Overhead;Carry;Lift   ? Examination-Participation Restrictions Cleaning;Other;Occupation;Community Activity;Laundry   ? Stability/Clinical Decision Making Evolving/Moderate complexity   ? Rehab Potential Good   ? PT Frequency 3x / week   ? PT Duration 4 weeks   ? PT Treatment/Interventions Electrical Stimulation;Cryotherapy;Iontophoresis '4mg'$ /ml Dexamethasone;Moist Heat;Neuromuscular re-education;Therapeutic exercise;Therapeutic activities;Patient/family education;Manual techniques;Passive range of motion;Taping;Vasopneumatic Device   ? PT Next Visit Plan Pendulums, PROM,  modalities as needed   ? Consulted and Agree with Plan of Care Patient   ? ?  ?  ? ?  ? ? ?Patient will benefit from skilled therapeutic intervention in order to improve the following deficits and impairments:  Decreased range of motion, Impaired UE functional use, Decreased activity tolerance, Pain, Hypomobility, Decreased strength ? ?Visit Diagnosis: ?Acute pain of left shoulder ? ?Stiffness of left shoulder, not elsewhere classified ? ? ? ? ?Problem List ?Patient Active Problem List  ? Diagnosis Date Noted  ? Low hemoglobin 11/15/2020  ?  Controlled substance agreement signed 02/04/2019  ? Benzodiazepine dependence ([place]) 02/04/2019  ? Gastroesophageal reflux disease 02/04/2019  ? Vitamin D deficiency 11/13/2017  ? Hypothyroidism 11/13/2017  ? Hyperlipemia 11/13/2017  ? Insomnia 11/10/2017  ? History of OCD (obsessive compulsive disorder) 01/12/2014  ? GAD (generalized anxiety disorder) 01/12/2014  ? Inguinal lymphadenopathy 05/26/2012  ? ? ?Darlin Coco, PT ?12/03/2021, 9:24 AM ? ?[place] ?[facility] ?[address] ?[place], [place], [address] ?Phone: [phone]  Fax:  [phone] ? ?Name: Katherine Hays ?MRN: [id number] ?Date of Birth: 1970/10/13 ? ? ? ?

## 2021-12-07 ENCOUNTER — Encounter: Payer: Self-pay | Admitting: Physical Therapy

## 2021-12-07 ENCOUNTER — Ambulatory Visit: Payer: No Typology Code available for payment source | Admitting: Physical Therapy

## 2021-12-07 DIAGNOSIS — M25512 Pain in left shoulder: Secondary | ICD-10-CM | POA: Diagnosis not present

## 2021-12-07 DIAGNOSIS — M25612 Stiffness of left shoulder, not elsewhere classified: Secondary | ICD-10-CM

## 2021-12-07 NOTE — Therapy (Signed)
[place] ?[facility] ?[place] ?[place], [place], [address] ?Phone: [phone]   Fax:  [phone] ? ?Physical Therapy Treatment ? ?Patient Details  ?Name: Katherine Hays ?MRN: [id number] ?Date of Birth: 1971/02/17 ?Referring Provider (PT): Doren Custard, PA-C ? ? ?Encounter Date: 12/07/2021 ? ? PT End of Session - 12/07/21 1124   ? ? Visit Number 16   ? Number of Visits 24   ? Date for PT Re-Evaluation 01/11/22   ? PT Start Time 1115   ? PT Stop Time 1203   ? PT Time Calculation (min) 48 min   ? Activity Tolerance Patient tolerated treatment well   ? Behavior During Therapy [facility] for tasks assessed/performed   ? ?  ?  ? ?  ? ? ?Past Medical History:  ?Diagnosis Date  ? Anxiety   ? Asthma   ? OCD (obsessive compulsive disorder)   ? ? ?Past Surgical History:  ?Procedure Laterality Date  ? TONSILLECTOMY    ? TUBAL LIGATION    ? WISDOM TOOTH EXTRACTION    ? ? ?There were no vitals filed for this visit. ? ? Subjective Assessment - 12/07/21 1224   ? ? Subjective reports that he raised her arm overhead but wants more ER.   ? Pertinent History Hypothyroidism   ? Limitations House hold activities;Lifting   ? Patient Stated Goals reduced pain, carry laundry   ? Currently in Pain? Other (Comment)   No pain assessment provided  ? ?  ?  ? ?  ? ? ? ? ? [facility] PT Assessment - 12/07/21 0001   ? ?  ? Assessment  ? Medical Diagnosis Left shoulder scope   ? Referring Provider (PT) Doren Custard, PA-C   ? Onset Date/Surgical Date 10/22/21   ? Hand Dominance Right   ? Next MD Visit 01/15/2022   ? Prior Therapy Yes   ?  ? Precautions  ? Precautions Shoulder   ? ?  ?  ? ?  ? ? ? ? ? ? ? ? ? ? ? ? ? ? ? ? [facility] Adult PT Treatment/Exercise - 12/07/21 0001   ? ?  ? Shoulder Exercises: Supine  ? External Rotation AAROM;Left;20 reps   ?  ? Shoulder Exercises: Pulleys  ? Flexion 5 minutes   ?  ? Shoulder Exercises: ROM/Strengthening  ? Ranger seated; flex x60m circles x3 min   ? Other ROM/Strengthening Exercises wall aldder  x3 reps (max 24,25)   ?  ? Modalities  ? Modalities Vasopneumatic   ?  ? Vasopneumatic  ? Number Minutes Vasopneumatic  15 minutes   ? Vasopnuematic Location  Shoulder   ? Vasopneumatic Pressure Low   ? Vasopneumatic Temperature  34   ?  ? Manual Therapy  ? Manual Therapy Passive ROM   ? Passive ROM PROM of L shoulder into flex/ER/IR with gentle holds at end range and oscillations   ? ?  ?  ? ?  ? ? ? ? ? ? ? ? ? ? PT Education - 12/07/21 1159   ? ? Education Details [id number]  ? Person(s) Educated Patient   ? Methods Explanation;Handout   ? Comprehension Verbalized understanding   ? ?  ?  ? ?  ? ? ? ? ? ? PT Long Term Goals - 10/29/21 0845   ? ?  ? PT LONG TERM GOAL #1  ? Title Patient will be independent with her HEP.   ? Time 4   ? Period Weeks   ?  Status On-going   ? Target Date 11/22/21   ?  ? PT LONG TERM GOAL #2  ? Title Patient will be able to demonstrate at least 120 degrees of active left shoulder flexion.   ? Time 4   ? Period Weeks   ? Status On-going   ? Target Date 11/22/21   ?  ? PT LONG TERM GOAL #3  ? Title Patient will be able to carry at least 8 pounds with her left upper extremity without being limited by her left shoulder for improved function carrying her laundry.   ? Time 4   ? Period Weeks   ? Status On-going   ? Target Date 11/22/21   ?  ? PT LONG TERM GOAL #4  ? Title Patient will be able to demonstrate at least 120 degrees of active left shoulder abduction for improved function reaching overhead.   ? Time 4   ? Period Weeks   ? Status On-going   ? Target Date 11/22/21   ? ?  ?  ? ?  ? ? ? ? ? ? ? ? Plan - 12/07/21 1151   ? ? Clinical Impression Statement Patient presented in clinic with reports of continued L UT pain and pain along L elbow and forearm. Patient progressed to more ROM focused exercises to begin ROM outside of sling. Some discomfort with AAROM exercises in sitting along with muscle fatigue. Firm end feels and smooth arc of motion noted during PROM of L shoulder with  intermittant popping in posterior shoulder that was not painful. Paitent advised to monitor popping while at home in which she states she doesn't wear the sling unless she is cleaning. Normal vasopneumatic response noted following removal of the modalty. Patient provided new HEP for AAROM with instruction to avoid pain and support LUE via R hand.   ? Personal Factors and Comorbidities Other   ? Examination-Activity Limitations Reach Overhead;Carry;Lift   ? Examination-Participation Restrictions Cleaning;Other;Occupation;Community Activity;Laundry   ? Stability/Clinical Decision Making Evolving/Moderate complexity   ? Rehab Potential Good   ? PT Frequency 3x / week   ? PT Duration 4 weeks   ? PT Treatment/Interventions Electrical Stimulation;Cryotherapy;Iontophoresis '4mg'$ /ml Dexamethasone;Moist Heat;Neuromuscular re-education;Therapeutic exercise;Therapeutic activities;Patient/family education;Manual techniques;Passive range of motion;Taping;Vasopneumatic Device   ? PT Next Visit Plan Pendulums, PROM,  modalities as needed   ? Consulted and Agree with Plan of Care Patient   ? ?  ?  ? ?  ? ? ?Patient will benefit from skilled therapeutic intervention in order to improve the following deficits and impairments:  Decreased range of motion, Impaired UE functional use, Decreased activity tolerance, Pain, Hypomobility, Decreased strength ? ?Visit Diagnosis: ?Acute pain of left shoulder ? ?Stiffness of left shoulder, not elsewhere classified ? ? ? ? ?Problem List ?Patient Active Problem List  ? Diagnosis Date Noted  ? Low hemoglobin 11/15/2020  ? Controlled substance agreement signed 02/04/2019  ? Benzodiazepine dependence ([place]) 02/04/2019  ? Gastroesophageal reflux disease 02/04/2019  ? Vitamin D deficiency 11/13/2017  ? Hypothyroidism 11/13/2017  ? Hyperlipemia 11/13/2017  ? Insomnia 11/10/2017  ? History of OCD (obsessive compulsive disorder) 01/12/2014  ? GAD (generalized anxiety disorder) 01/12/2014  ? Inguinal  lymphadenopathy 05/26/2012  ? ? ?Standley Brooking, PTA ?12/07/2021, 12:25 PM ? ?[place] ?[facility] ?[place] ?[place], [place], [address] ?Phone: [phone]   Fax:  [phone] ? ?Name: Katherine Hays ?MRN: [id number] ?Date of Birth: 21-Jun-1971 ? ? ? ?

## 2021-12-11 ENCOUNTER — Ambulatory Visit: Payer: No Typology Code available for payment source | Admitting: *Deleted

## 2021-12-11 DIAGNOSIS — M25612 Stiffness of left shoulder, not elsewhere classified: Secondary | ICD-10-CM

## 2021-12-11 DIAGNOSIS — G8929 Other chronic pain: Secondary | ICD-10-CM

## 2021-12-11 DIAGNOSIS — M25512 Pain in left shoulder: Secondary | ICD-10-CM | POA: Diagnosis not present

## 2021-12-11 NOTE — Therapy (Signed)
[place] ?[facility] ?[place] ?[place], [place], [address] ?Phone: [phone]   Fax:  [phone] ? ?Physical Therapy Treatment ? ?Patient Details  ?Name: Katherine Hays ?MRN: [id number] ?Date of Birth: 11-19-1970 ?Referring Provider (PT): Doren Custard, PA-C ? ? ?Encounter Date: 12/11/2021 ? ? PT End of Session - 12/11/21 1113   ? ? Visit Number 17   ? Number of Visits 24   ? Date for PT Re-Evaluation 01/11/22   ? PT Start Time 1115   ? PT Stop Time 1210   ? PT Time Calculation (min) 55 min   ? ?  ?  ? ?  ? ? ?Past Medical History:  ?Diagnosis Date  ? Anxiety   ? Asthma   ? OCD (obsessive compulsive disorder)   ? ? ?Past Surgical History:  ?Procedure Laterality Date  ? TONSILLECTOMY    ? TUBAL LIGATION    ? WISDOM TOOTH EXTRACTION    ? ? ?There were no vitals filed for this visit. ? ? Subjective Assessment - 12/11/21 1129   ? ? Subjective LT shldr pain 2/10 today. Working on ER at home   ? Pertinent History Hypothyroidism   ? Limitations House hold activities;Lifting   ? Currently in Pain? Yes   ? Pain Score 2    ? Pain Location Shoulder   ? Pain Orientation Left   ? Pain Descriptors / Indicators Discomfort   ? Pain Type Surgical pain   ? Pain Onset 1 to 4 weeks ago   ? ?  ?  ? ?  ? ? ? ? ? ? ? ? ? ? ? ? ? ? ? ? ? ? ? ? [facility] Adult PT Treatment/Exercise - 12/11/21 0001   ? ?  ? Shoulder Exercises: Supine  ? External Rotation AAROM;Left;20 reps   ?  ? Shoulder Exercises: Pulleys  ? Flexion 5 minutes   ?  ? Shoulder Exercises: ROM/Strengthening  ? Ranger seated; flexion/extension and, circles x5 mins   ? Other ROM/Strengthening Exercises wall aldder x10 reps (max 24,25)   ?  ? Modalities  ? Modalities Vasopneumatic   ?  ? Electrical Stimulation  ? Electrical Stimulation Location Left UT   ? Electrical Stimulation Action IFC   ? Electrical Stimulation Parameters 80-'150hz'$  x 10 mins   ? Electrical Stimulation Goals Tone;Pain   ?  ? Vasopneumatic  ? Number Minutes Vasopneumatic  10  minutes   ? Vasopnuematic Location  Shoulder   ? Vasopneumatic Pressure Low   ? Vasopneumatic Temperature  34   ?  ? Manual Therapy  ? Manual Therapy Passive ROM   ? Soft tissue mobilization TPR to LT UT and STW to posterior cuff   ? Passive ROM PROM of L shoulder into flex/ ABD /ER/IR with gentle holds at end range and oscillations   ? ?  ?  ? ?  ? ? ? ? ? ? ? ? ? ? ? ? ? ? ? PT Long Term Goals - 10/29/21 0845   ? ?  ? PT LONG TERM GOAL #1  ? Title Patient will be independent with her HEP.   ? Time 4   ? Period Weeks   ? Status On-going   ? Target Date 11/22/21   ?  ? PT LONG TERM GOAL #2  ? Title Patient will be able to demonstrate at least 120 degrees of active left shoulder flexion.   ? Time 4   ? Period Weeks   ?  Status On-going   ? Target Date 11/22/21   ?  ? PT LONG TERM GOAL #3  ? Title Patient will be able to carry at least 8 pounds with her left upper extremity without being limited by her left shoulder for improved function carrying her laundry.   ? Time 4   ? Period Weeks   ? Status On-going   ? Target Date 11/22/21   ?  ? PT LONG TERM GOAL #4  ? Title Patient will be able to demonstrate at least 120 degrees of active left shoulder abduction for improved function reaching overhead.   ? Time 4   ? Period Weeks   ? Status On-going   ? Target Date 11/22/21   ? ?  ?  ? ?  ? ? ? ? ? ? ? ? Plan - 12/11/21 1205   ? ? Clinical Impression Statement Pt arrived today still with soreness in LT shldr. She reports being compliant with HEP.Rx focused on AAROM as well as PROM for flexion, Abd, as well as ER/IR. ER Vaso and estim end of session   ? Personal Factors and Comorbidities Other   ? Examination-Activity Limitations Reach Overhead;Carry;Lift   ? Examination-Participation Restrictions Cleaning;Other;Occupation;Community Activity;Laundry   ? Stability/Clinical Decision Making Evolving/Moderate complexity   ? Rehab Potential Good   ? PT Frequency 3x / week   ? PT Duration 4 weeks   ? PT Treatment/Interventions  Electrical Stimulation;Cryotherapy;Iontophoresis '4mg'$ /ml Dexamethasone;Moist Heat;Neuromuscular re-education;Therapeutic exercise;Therapeutic activities;Patient/family education;Manual techniques;Passive range of motion;Taping;Vasopneumatic Device   ? PT Next Visit Plan Pendulums, PROM,  modalities as needed AAROM   ? Consulted and Agree with Plan of Care Patient   ? ?  ?  ? ?  ? ? ?Patient will benefit from skilled therapeutic intervention in order to improve the following deficits and impairments:  Decreased range of motion, Impaired UE functional use, Decreased activity tolerance, Pain, Hypomobility, Decreased strength ? ?Visit Diagnosis: ?Acute pain of left shoulder ? ?Stiffness of left shoulder, not elsewhere classified ? ?Chronic left shoulder pain ? ? ? ? ?Problem List ?Patient Active Problem List  ? Diagnosis Date Noted  ? Low hemoglobin 11/15/2020  ? Controlled substance agreement signed 02/04/2019  ? Benzodiazepine dependence ([place]) 02/04/2019  ? Gastroesophageal reflux disease 02/04/2019  ? Vitamin D deficiency 11/13/2017  ? Hypothyroidism 11/13/2017  ? Hyperlipemia 11/13/2017  ? Insomnia 11/10/2017  ? History of OCD (obsessive compulsive disorder) 01/12/2014  ? GAD (generalized anxiety disorder) 01/12/2014  ? Inguinal lymphadenopathy 05/26/2012  ? ? ?Valora Norell,CHRIS, PTA ?12/11/2021, 3:35 PM ? ?[place] ?[facility] ?[place] ?[place], [place], [address] ?Phone: [phone]   Fax:  [phone] ? ?Name: Katherine Hays ?MRN: [id number] ?Date of Birth: [birth year] ? ? ? ?

## 2021-12-14 ENCOUNTER — Ambulatory Visit: Payer: No Typology Code available for payment source | Admitting: *Deleted

## 2021-12-14 DIAGNOSIS — M25612 Stiffness of left shoulder, not elsewhere classified: Secondary | ICD-10-CM

## 2021-12-14 DIAGNOSIS — M25512 Pain in left shoulder: Secondary | ICD-10-CM | POA: Diagnosis not present

## 2021-12-14 NOTE — Therapy (Signed)
[place] ?[facility] ?[place] ?[place], [place], [address] ?Phone: [phone]   Fax:  [phone] ? ?Physical Therapy Treatment ? ?Patient Details  ?Name: Katherine Hays ?MRN: [id number] ?Date of Birth: 12/23/1970 ?Referring Provider (PT): Doren Custard, PA-C ? ? ?Encounter Date: 12/14/2021 ? ? PT End of Session - 12/14/21 1233   ? ? Visit Number 18   ? Number of Visits 24   ? Date for PT Re-Evaluation 01/11/22   ? PT Start Time 1115   ? PT Stop Time 1210   ? PT Time Calculation (min) 55 min   ? ?  ?  ? ?  ? ? ?Past Medical History:  ?Diagnosis Date  ? Anxiety   ? Asthma   ? OCD (obsessive compulsive disorder)   ? ? ?Past Surgical History:  ?Procedure Laterality Date  ? TONSILLECTOMY    ? TUBAL LIGATION    ? WISDOM TOOTH EXTRACTION    ? ? ?There were no vitals filed for this visit. ? ? Subjective Assessment - 12/14/21 1127   ? ? Subjective Diid good after last Rx and was able to sleep better.   ? Pertinent History Hypothyroidism   ? Limitations House hold activities;Lifting   ? Patient Stated Goals reduced pain, carry laundry   ? Currently in Pain? Yes   ? Pain Score 3    ? Pain Location Shoulder   ? Pain Orientation Left   ? Pain Descriptors / Indicators Discomfort   ? Pain Type Surgical pain   ? Pain Onset 1 to 4 weeks ago   ? ?  ?  ? ?  ? ? ? ? ? ? ? ? ? ? ? ? ? ? ? ? ? ? ? ? [place] Adult PT Treatment/Exercise - 12/14/21 0001   ? ?  ? Shoulder Exercises: Supine  ? Protraction AROM;Left   2x10  ? External Rotation AAROM;Left;20 reps;10 reps   ? Other Supine Exercises small circles CW/CCW at 90 degrees   ?  ? Shoulder Exercises: Pulleys  ? Flexion 5 minutes   ?  ? Shoulder Exercises: ROM/Strengthening  ? Ranger Standing UE ranger: Flexion, CW/CCW 2x10 each.   ?  ? Modalities  ? Modalities Vasopneumatic   ?  ? Electrical Stimulation  ? Electrical Stimulation Location Left UT   ? Electrical Stimulation Action IFC   ? Electrical Stimulation Parameters 80-'150hz'$  x 15 mins   ? Electrical  Stimulation Goals Tone;Pain   ?  ? Vasopneumatic  ? Number Minutes Vasopneumatic  15 minutes   ? Vasopnuematic Location  Shoulder   ? Vasopneumatic Pressure Low   ? Vasopneumatic Temperature  34   ?  ? Manual Therapy  ? Manual Therapy Passive ROM   ? Manual therapy comments ER to 55 degrees, Flexion to 150 degrees   ? Soft tissue mobilization TPR to posterior cuff and sub scap   ? Passive ROM PROM of L shoulder into flex/ ABD /ER/IR with gentle holds at end range and oscillations   ? ?  ?  ? ?  ? ? ? ? ? ? ? ? ? ? ? ? ? ? ? PT Long Term Goals - 10/29/21 0845   ? ?  ? PT LONG TERM GOAL #1  ? Title Patient will be independent with her HEP.   ? Time 4   ? Period Weeks   ? Status On-going   ? Target Date 11/22/21   ?  ? PT LONG TERM GOAL #  2  ? Title Patient will be able to demonstrate at least 120 degrees of active left shoulder flexion.   ? Time 4   ? Period Weeks   ? Status On-going   ? Target Date 11/22/21   ?  ? PT LONG TERM GOAL #3  ? Title Patient will be able to carry at least 8 pounds with her left upper extremity without being limited by her left shoulder for improved function carrying her laundry.   ? Time 4   ? Period Weeks   ? Status On-going   ? Target Date 11/22/21   ?  ? PT LONG TERM GOAL #4  ? Title Patient will be able to demonstrate at least 120 degrees of active left shoulder abduction for improved function reaching overhead.   ? Time 4   ? Period Weeks   ? Status On-going   ? Target Date 11/22/21   ? ?  ?  ? ?  ? ? ? ? ? ? ? ? Plan - 12/14/21 1139   ? ? Clinical Impression Statement Pt arrived today doing fairly well with LT shldr. She was able to perform sitting/ standing AAROM exercises and supine AROM punches and small circles. She continues to have consistant pain in posterior aspect with PROM ER and at end-range 55 degrees. HMP and Estim to LT shldr tolerated well   ? Personal Factors and Comorbidities Other   ? Examination-Activity Limitations Reach Overhead;Carry;Lift   ?  Examination-Participation Restrictions Cleaning;Other;Occupation;Community Activity;Laundry   ? Stability/Clinical Decision Making Evolving/Moderate complexity   ? Rehab Potential Good   ? PT Frequency 3x / week   ? PT Duration 4 weeks   ? PT Treatment/Interventions Electrical Stimulation;Cryotherapy;Iontophoresis '4mg'$ /ml Dexamethasone;Moist Heat;Neuromuscular re-education;Therapeutic exercise;Therapeutic activities;Patient/family education;Manual techniques;Passive range of motion;Taping;Vasopneumatic Device   ? PT Next Visit Plan Pendulums, PROM,  modalities as needed AAROM      March 6th surgery ( 7 weeks 12-10-21)   ? Consulted and Agree with Plan of Care Patient   ? ?  ?  ? ?  ? ? ?Patient will benefit from skilled therapeutic intervention in order to improve the following deficits and impairments:  Decreased range of motion, Impaired UE functional use, Decreased activity tolerance, Pain, Hypomobility, Decreased strength ? ?Visit Diagnosis: ?Acute pain of left shoulder ? ?Stiffness of left shoulder, not elsewhere classified ? ?Chronic left shoulder pain ? ? ? ? ?Problem List ?Patient Active Problem List  ? Diagnosis Date Noted  ? Low hemoglobin 11/15/2020  ? Controlled substance agreement signed 02/04/2019  ? Benzodiazepine dependence ([place]) 02/04/2019  ? Gastroesophageal reflux disease 02/04/2019  ? Vitamin D deficiency 11/13/2017  ? Hypothyroidism 11/13/2017  ? Hyperlipemia 11/13/2017  ? Insomnia 11/10/2017  ? History of OCD (obsessive compulsive disorder) 01/12/2014  ? GAD (generalized anxiety disorder) 01/12/2014  ? Inguinal lymphadenopathy 05/26/2012  ? ? ?Katherine Hays,CHRIS, PTA ?12/14/2021, 12:41 PM ? ?[place] ?[facility] ?[address] ?[place], [place], [address] ?Phone: [phone]   Fax:  [phone] ? ?Name: Katherine Hays ?MRN: [id number] ?Date of Birth: 1971/02/15 ? ? ? ?

## 2021-12-17 ENCOUNTER — Encounter: Payer: Self-pay | Admitting: Physical Therapy

## 2021-12-17 ENCOUNTER — Ambulatory Visit: Payer: No Typology Code available for payment source | Attending: Physician Assistant | Admitting: Physical Therapy

## 2021-12-17 DIAGNOSIS — G8929 Other chronic pain: Secondary | ICD-10-CM | POA: Diagnosis present

## 2021-12-17 DIAGNOSIS — M25512 Pain in left shoulder: Secondary | ICD-10-CM | POA: Diagnosis present

## 2021-12-17 DIAGNOSIS — M25612 Stiffness of left shoulder, not elsewhere classified: Secondary | ICD-10-CM | POA: Diagnosis present

## 2021-12-17 NOTE — Therapy (Signed)
[place] ?[facility] ?[address] ?[place], [place], [address] ?Phone: [phone]   Fax:  [phone] ? ?Physical Therapy Treatment ? ?Patient Details  ?Name: Katherine Hays ?MRN: [id number] ?Date of Birth: [birth year] ?Referring Provider (PT): Doren Custard, PA-C ? ? ?Encounter Date: 12/17/2021 ? ? PT End of Session - 12/17/21 0943   ? ? Visit Number 19   ? Number of Visits 24   ? Date for PT Re-Evaluation 01/11/22   ? PT Start Time 1829   ? PT Stop Time 9371   ? PT Time Calculation (min) 42 min   ? Activity Tolerance Patient tolerated treatment well   ? Behavior During Therapy [facility] for tasks assessed/performed   ? ?  ?  ? ?  ? ? ?Past Medical History:  ?Diagnosis Date  ? Anxiety   ? Asthma   ? OCD (obsessive compulsive disorder)   ? ? ?Past Surgical History:  ?Procedure Laterality Date  ? TONSILLECTOMY    ? TUBAL LIGATION    ? WISDOM TOOTH EXTRACTION    ? ? ?There were no vitals filed for this visit. ? ? Subjective Assessment - 12/17/21 0942   ? ? Subjective No new complaints.   ? Pertinent History Hypothyroidism   ? Limitations House hold activities;Lifting   ? Patient Stated Goals reduced pain, carry laundry   ? Currently in Pain? Other (Comment)   No pain assessment provided  ? ?  ?  ? ?  ? ? ? ? ? [facility] PT Assessment - 12/17/21 0001   ? ?  ? Assessment  ? Medical Diagnosis Left shoulder scope   ? Referring Provider (PT) Doren Custard, PA-C   ? Onset Date/Surgical Date 10/22/21   ? Hand Dominance Right   ? Next MD Visit 01/15/2022   ? Prior Therapy Yes   ?  ? Precautions  ? Precautions Shoulder   ? ?  ?  ? ?  ? ? ? ? ? ? ? ? ? ? ? ? ? ? ? ? [place] Adult PT Treatment/Exercise - 12/17/21 0001   ? ?  ? Shoulder Exercises: Supine  ? Protraction AAROM;Both;20 reps   ? External Rotation AAROM;Left;20 reps   ? Flexion AAROM;Both;20 reps   ?  ? Shoulder Exercises: Standing  ? Other Standing Exercises Ball rolls into flex, ER/IR x15 reps   ?  ? Shoulder Exercises: Pulleys  ? Flexion 5 minutes   ?  ?  Shoulder Exercises: ROM/Strengthening  ? Ranger seated flex/ext x20 reps, circles x20 reps   ?  ? Modalities  ? Modalities Vasopneumatic   ?  ? Vasopneumatic  ? Number Minutes Vasopneumatic  10 minutes   ? Vasopnuematic Location  Shoulder   ? Vasopneumatic Pressure Low   ? Vasopneumatic Temperature  34   ?  ? Manual Therapy  ? Manual Therapy Passive ROM   ? Passive ROM PROM of L shoulder into flex/ ABD /ER/IR with gentle holds at end range and oscillations   ? ?  ?  ? ?  ? ? ? ? ? ? ? ? ? ? ? ? ? ? ? PT Long Term Goals - 10/29/21 0845   ? ?  ? PT LONG TERM GOAL #1  ? Title Patient will be independent with her HEP.   ? Time 4   ? Period Weeks   ? Status On-going   ? Target Date 11/22/21   ?  ? PT LONG TERM GOAL #2  ? Title Patient  will be able to demonstrate at least 120 degrees of active left shoulder flexion.   ? Time 4   ? Period Weeks   ? Status On-going   ? Target Date 11/22/21   ?  ? PT LONG TERM GOAL #3  ? Title Patient will be able to carry at least 8 pounds with her left upper extremity without being limited by her left shoulder for improved function carrying her laundry.   ? Time 4   ? Period Weeks   ? Status On-going   ? Target Date 11/22/21   ?  ? PT LONG TERM GOAL #4  ? Title Patient will be able to demonstrate at least 120 degrees of active left shoulder abduction for improved function reaching overhead.   ? Time 4   ? Period Weeks   ? Status On-going   ? Target Date 11/22/21   ? ?  ?  ? ?  ? ? ? ? ? ? ? ? Plan - 12/17/21 1029   ? ? Clinical Impression Statement Patient presented in clinic with some discomfort but able to progress to more AAROM exercises. Patient still fairly guarded with LUE. Firm end feels and smooth arc of motion noted during PROM. Increased L shoulder ER measured as 55 deg. Normal vasopnuematic response noted following removal of the modality.   ? Personal Factors and Comorbidities Other   ? Examination-Activity Limitations Reach Overhead;Carry;Lift   ? Examination-Participation  Restrictions Cleaning;Other;Occupation;Community Activity;Laundry   ? Stability/Clinical Decision Making Evolving/Moderate complexity   ? Rehab Potential Good   ? PT Frequency 3x / week   ? PT Duration 4 weeks   ? PT Treatment/Interventions Electrical Stimulation;Cryotherapy;Iontophoresis '4mg'$ /ml Dexamethasone;Moist Heat;Neuromuscular re-education;Therapeutic exercise;Therapeutic activities;Patient/family education;Manual techniques;Passive range of motion;Taping;Vasopneumatic Device   ? PT Next Visit Plan Progress per protocol.   ? Consulted and Agree with Plan of Care Patient   ? ?  ?  ? ?  ? ? ?Patient will benefit from skilled therapeutic intervention in order to improve the following deficits and impairments:  Decreased range of motion, Impaired UE functional use, Decreased activity tolerance, Pain, Hypomobility, Decreased strength ? ?Visit Diagnosis: ?Acute pain of left shoulder ? ?Stiffness of left shoulder, not elsewhere classified ? ? ? ? ?Problem List ?Patient Active Problem List  ? Diagnosis Date Noted  ? Low hemoglobin 11/15/2020  ? Controlled substance agreement signed 02/04/2019  ? Benzodiazepine dependence ([place]) 02/04/2019  ? Gastroesophageal reflux disease 02/04/2019  ? Vitamin D deficiency 11/13/2017  ? Hypothyroidism 11/13/2017  ? Hyperlipemia 11/13/2017  ? Insomnia 11/10/2017  ? History of OCD (obsessive compulsive disorder) 01/12/2014  ? GAD (generalized anxiety disorder) 01/12/2014  ? Inguinal lymphadenopathy 05/26/2012  ? ? ?Standley Brooking, PTA ?12/17/2021, 11:18 AM ? ?[place] ?[facility] ?[address] ?[place], [place], [address] ?Phone: [phone]   Fax:  [phone] ? ?Name: Katherine Hays ?MRN: [id number] ?Date of Birth: [birth year] ? ? ? ?

## 2021-12-21 ENCOUNTER — Encounter: Payer: Self-pay | Admitting: Physical Therapy

## 2021-12-21 ENCOUNTER — Ambulatory Visit: Payer: No Typology Code available for payment source | Admitting: Physical Therapy

## 2021-12-21 DIAGNOSIS — M25612 Stiffness of left shoulder, not elsewhere classified: Secondary | ICD-10-CM

## 2021-12-21 DIAGNOSIS — M25512 Pain in left shoulder: Secondary | ICD-10-CM

## 2021-12-21 NOTE — Therapy (Signed)
[place] ?[facility] ?[place] ?[place], [place], [address] ?Phone: [phone]   Fax:  [phone] ? ?Physical Therapy Treatment ? ?Patient Details  ?Name: Katherine Hays ?MRN: [id number] ?Date of Birth: [birth year] ?Referring Provider (PT): Doren Custard, PA-C ? ? ?Encounter Date: 12/21/2021 ? ? PT End of Session - 12/21/21 1155   ? ? Visit Number 20   ? Number of Visits 24   ? Date for PT Re-Evaluation 01/11/22   ? PT Start Time 1115   ? PT Stop Time 1202   ? PT Time Calculation (min) 47 min   ? Activity Tolerance Patient tolerated treatment well   ? Behavior During Therapy [facility] for tasks assessed/performed   ? ?  ?  ? ?  ? ? ?Past Medical History:  ?Diagnosis Date  ? Anxiety   ? Asthma   ? OCD (obsessive compulsive disorder)   ? ? ?Past Surgical History:  ?Procedure Laterality Date  ? TONSILLECTOMY    ? TUBAL LIGATION    ? WISDOM TOOTH EXTRACTION    ? ? ?There were no vitals filed for this visit. ? ? Subjective Assessment - 12/21/21 1120   ? ? Subjective No new complaints.   ? Pertinent History Hypothyroidism   ? Limitations House hold activities;Lifting   ? Patient Stated Goals reduced pain, carry laundry   ? Currently in Pain? Yes   ? Pain Score 1    ? Pain Location Shoulder   ? Pain Orientation Left   ? Pain Descriptors / Indicators Discomfort   ? Pain Type Surgical pain   ? Pain Onset More than a month ago   ? Pain Frequency Intermittent   ? ?  ?  ? ?  ? ? ? ? ? [facility] PT Assessment - 12/21/21 0001   ? ?  ? Assessment  ? Medical Diagnosis Left shoulder scope   ? Referring Provider (PT) Doren Custard, PA-C   ? Onset Date/Surgical Date 10/22/21   ? Hand Dominance Right   ? Next MD Visit 01/15/2022   ? Prior Therapy Yes   ?  ? Precautions  ? Precautions Shoulder   ?  ? Observation/Other Assessments  ? Focus on Therapeutic Outcomes (FOTO)  41% limitation 20th visit   ? ?  ?  ? ?  ? ? ? ? ? ? ? ? ? ? ? ? ? ? ? ? [place] Adult PT Treatment/Exercise - 12/21/21 0001   ? ?  ? Shoulder Exercises:  Supine  ? Protraction AAROM;Both;20 reps   ? External Rotation AAROM;Left;20 reps   ? Flexion AAROM;Both;20 reps   ?  ? Shoulder Exercises: Standing  ? Other Standing Exercises Ball rolls on table x20 reps   ? Other Standing Exercises ABC on table x1 rep   ?  ? Shoulder Exercises: Pulleys  ? Flexion 5 minutes   ?  ? Shoulder Exercises: ROM/Strengthening  ? Ranger standing; flex x20 reps   ?  ? Modalities  ? Modalities Vasopneumatic   ?  ? Vasopneumatic  ? Number Minutes Vasopneumatic  10 minutes   ? Vasopnuematic Location  Shoulder   ? Vasopneumatic Pressure Low   ? Vasopneumatic Temperature  34   ?  ? Manual Therapy  ? Manual Therapy Passive ROM   ? Passive ROM PROM of L shoulder into flex/ER/IR with gentle holds at end range   ? ?  ?  ? ?  ? ? ? ? ? ? ? ? ? ? ? ? ? ? ?  PT Long Term Goals - 10/29/21 0845   ? ?  ? PT LONG TERM GOAL #1  ? Title Patient will be independent with her HEP.   ? Time 4   ? Period Weeks   ? Status On-going   ? Target Date 11/22/21   ?  ? PT LONG TERM GOAL #2  ? Title Patient will be able to demonstrate at least 120 degrees of active left shoulder flexion.   ? Time 4   ? Period Weeks   ? Status On-going   ? Target Date 11/22/21   ?  ? PT LONG TERM GOAL #3  ? Title Patient will be able to carry at least 8 pounds with her left upper extremity without being limited by her left shoulder for improved function carrying her laundry.   ? Time 4   ? Period Weeks   ? Status On-going   ? Target Date 11/22/21   ?  ? PT LONG TERM GOAL #4  ? Title Patient will be able to demonstrate at least 120 degrees of active left shoulder abduction for improved function reaching overhead.   ? Time 4   ? Period Weeks   ? Status On-going   ? Target Date 11/22/21   ? ?  ?  ? ?  ? ? ? ? ? ? ? ? Plan - 12/21/21 1210   ? ? Clinical Impression Statement Patient presented in clinic and progressed with more AAROM exercises. Patient required assist with eccentric return from UE ranger in standing. More limitation with ER AA  and passively as well. Firm end feels and smooth arc of motion noted during PROM of L shoulder. Normal vasopneumatic response noted following removal of the modality. Patient returns to work next week.   ? Personal Factors and Comorbidities Other   ? Examination-Activity Limitations Reach Overhead;Carry;Lift   ? Examination-Participation Restrictions Cleaning;Other;Occupation;Community Activity;Laundry   ? Stability/Clinical Decision Making Evolving/Moderate complexity   ? Rehab Potential Good   ? PT Frequency 3x / week   ? PT Duration 4 weeks   ? PT Treatment/Interventions Electrical Stimulation;Cryotherapy;Iontophoresis '4mg'$ /ml Dexamethasone;Moist Heat;Neuromuscular re-education;Therapeutic exercise;Therapeutic activities;Patient/family education;Manual techniques;Passive range of motion;Taping;Vasopneumatic Device   ? PT Next Visit Plan Progress per protocol.   ? Consulted and Agree with Plan of Care Patient   ? ?  ?  ? ?  ? ? ?Patient will benefit from skilled therapeutic intervention in order to improve the following deficits and impairments:  Decreased range of motion, Impaired UE functional use, Decreased activity tolerance, Pain, Hypomobility, Decreased strength ? ?Visit Diagnosis: ?Acute pain of left shoulder ? ?Stiffness of left shoulder, not elsewhere classified ? ? ? ? ?Problem List ?Patient Active Problem List  ? Diagnosis Date Noted  ? Low hemoglobin 11/15/2020  ? Controlled substance agreement signed 02/04/2019  ? Benzodiazepine dependence ([place]) 02/04/2019  ? Gastroesophageal reflux disease 02/04/2019  ? Vitamin D deficiency 11/13/2017  ? Hypothyroidism 11/13/2017  ? Hyperlipemia 11/13/2017  ? Insomnia 11/10/2017  ? History of OCD (obsessive compulsive disorder) 01/12/2014  ? GAD (generalized anxiety disorder) 01/12/2014  ? Inguinal lymphadenopathy 05/26/2012  ? ? ?Standley Brooking, PTA ?12/21/2021, 12:15 PM ? ?[place] ?[facility] ?[place] ?[place], [place],  [address] ?Phone: [phone]   Fax:  [phone] ? ?Name: Katherine Hays ?MRN: [id number] ?Date of Birth: Nov 05, 1970 ? ? ? ?

## 2021-12-25 ENCOUNTER — Ambulatory Visit: Payer: No Typology Code available for payment source

## 2021-12-25 DIAGNOSIS — M25612 Stiffness of left shoulder, not elsewhere classified: Secondary | ICD-10-CM

## 2021-12-25 DIAGNOSIS — M25512 Pain in left shoulder: Secondary | ICD-10-CM | POA: Diagnosis not present

## 2021-12-25 NOTE — Therapy (Signed)
[place] ?[facility] ?[place] ?[place], [place], [address] ?Phone: [phone]   Fax:  [phone] ? ?Physical Therapy Treatment ? ?Patient Details  ?Name: Katherine Hays ?MRN: [id number] ?Date of Birth: 03-14-1971 ?Referring Provider (PT): Doren Custard, PA-C ? ? ?Encounter Date: 12/25/2021 ? ? PT End of Session - 12/25/21 1602   ? ? Visit Number 21   ? Number of Visits 24   ? Date for PT Re-Evaluation 01/11/22   ? PT Start Time 1600   ? PT Stop Time 0867   ? PT Time Calculation (min) 54 min   ? Activity Tolerance Patient tolerated treatment well   ? Behavior During Therapy [facility] for tasks assessed/performed   ? ?  ?  ? ?  ? ? ?Past Medical History:  ?Diagnosis Date  ? Anxiety   ? Asthma   ? OCD (obsessive compulsive disorder)   ? ? ?Past Surgical History:  ?Procedure Laterality Date  ? TONSILLECTOMY    ? TUBAL LIGATION    ? WISDOM TOOTH EXTRACTION    ? ? ?There were no vitals filed for this visit. ? ? Subjective Assessment - 12/25/21 1600   ? ? Subjective No new complaints.   ? Pertinent History Hypothyroidism   ? Limitations House hold activities;Lifting   ? Patient Stated Goals reduced pain, carry laundry   ? Currently in Pain? Yes   ? Pain Score 1    ? Pain Location Shoulder   ? Pain Orientation Left   ? Pain Descriptors / Indicators Sore   ? Pain Onset More than a month ago   ? ?  ?  ? ?  ? ? ? ? ? [facility] PT Assessment - 12/25/21 0001   ? ?  ? AROM  ? Left Shoulder Flexion 118 Degrees   "tight" and "hurts"  ? Left Shoulder ABduction 98 Degrees   ? ?  ?  ? ?  ? ? ? ? ? ? ? ? ? ? ? ? ? ? ? ? [place] Adult PT Treatment/Exercise - 12/25/21 0001   ? ?  ? Shoulder Exercises: Standing  ? Protraction --   ? External Rotation Left;Theraband   isometric step out  ? Theraband Level (Shoulder External Rotation) Level 2 (Red)   ? Internal Rotation Left;Theraband;20 reps   ? Theraband Level (Shoulder Internal Rotation) Level 3 (Green)   ? Flexion AROM;Both;20 reps   with cane; at mirror to limit UT  engagment  ? Row Both;Other (comment)   ? Row Weight (lbs) Orange XTS   ?  ? Shoulder Exercises: Pulleys  ? Flexion 3 minutes   ?  ? Shoulder Exercises: ROM/Strengthening  ? Ranger standing flexion   142 degrees of flexion; 2 minutes  ? Other ROM/Strengthening Exercises wall aldder x5 reps (max 25)   ?  ? Shoulder Exercises: Stretch  ? Internal Rotation Stretch 3 reps   30 seconds  ? Other Shoulder Stretches Upper trapezius   Left; 3 x 30"  ?  ? Modalities  ? Modalities Vasopneumatic   ?  ? Vasopneumatic  ? Number Minutes Vasopneumatic  15 minutes   ? Vasopnuematic Location  Shoulder   ? Vasopneumatic Pressure Low   ? Vasopneumatic Temperature  34   ? ?  ?  ? ?  ? ? ? ? ? ? ? ? ? ? ? ? ? ? ? PT Long Term Goals - 12/25/21 1658   ? ?  ? PT LONG TERM GOAL #1  ?  Title Patient will be independent with her HEP.   ? Time 4   ? Period Weeks   ? Status On-going   ? Target Date 11/22/21   ?  ? PT LONG TERM GOAL #2  ? Title Patient will be able to demonstrate at least 120 degrees of active left shoulder flexion.   ? Time 4   ? Period Weeks   ? Status On-going   ? Target Date 11/22/21   ?  ? PT LONG TERM GOAL #3  ? Title Patient will be able to carry at least 8 pounds with her left upper extremity without being limited by her left shoulder for improved function carrying her laundry.   ? Time 4   ? Period Weeks   ? Status On-going   ? Target Date 11/22/21   ?  ? PT LONG TERM GOAL #4  ? Title Patient will be able to demonstrate at least 120 degrees of active left shoulder abduction for improved function reaching overhead.   ? Time 4   ? Period Weeks   ? Status On-going   ? Target Date 11/22/21   ? ?  ?  ? ?  ? ? ? ? ? ? ? ? Plan - 12/25/21 1602   ? ? Clinical Impression Statement Patient was introduced to multiple new interventions for improved left shoulder mobility and light rotator cuff engagement. She required moderate multimodal cueing with cane flexion to limit upper trapezius engagment and scapular elevation. She was able  to achieve approximately 90 degrees of left shoulder flexion prior to a significant increase in scapular elevation. Fatigue was her primary limiting factor with today's interventions. She reported feeling good upon the conclusion of treatment. She continues to require skilled physical therapy to address her remaining impairments to return to her prior level of function.   ? Personal Factors and Comorbidities Other   ? Examination-Activity Limitations Reach Overhead;Carry;Lift   ? Examination-Participation Restrictions Cleaning;Other;Occupation;Community Activity;Laundry   ? Stability/Clinical Decision Making Evolving/Moderate complexity   ? Rehab Potential Good   ? PT Frequency 3x / week   ? PT Duration 4 weeks   ? PT Treatment/Interventions Electrical Stimulation;Cryotherapy;Iontophoresis '4mg'$ /ml Dexamethasone;Moist Heat;Neuromuscular re-education;Therapeutic exercise;Therapeutic activities;Patient/family education;Manual techniques;Passive range of motion;Taping;Vasopneumatic Device   ? PT Next Visit Plan Progress per protocol.   ? Consulted and Agree with Plan of Care Patient   ? ?  ?  ? ?  ? ? ?Patient will benefit from skilled therapeutic intervention in order to improve the following deficits and impairments:  Decreased range of motion, Impaired UE functional use, Decreased activity tolerance, Pain, Hypomobility, Decreased strength ? ?Visit Diagnosis: ?Acute pain of left shoulder ? ?Stiffness of left shoulder, not elsewhere classified ? ? ? ? ?Problem List ?Patient Active Problem List  ? Diagnosis Date Noted  ? Low hemoglobin 11/15/2020  ? Controlled substance agreement signed 02/04/2019  ? Benzodiazepine dependence ([place]) 02/04/2019  ? Gastroesophageal reflux disease 02/04/2019  ? Vitamin D deficiency 11/13/2017  ? Hypothyroidism 11/13/2017  ? Hyperlipemia 11/13/2017  ? Insomnia 11/10/2017  ? History of OCD (obsessive compulsive disorder) 01/12/2014  ? GAD (generalized anxiety disorder) 01/12/2014  ? Inguinal  lymphadenopathy 05/26/2012  ? ? ?Darlin Coco, PT ?12/25/2021, 4:58 PM ? ?[place] ?[facility] ?[place] ?[place], [place], [address] ?Phone: [phone]   Fax:  [phone] ? ?Name: DEEANDRA JERRY ?MRN: [id number] ?Date of Birth: Jan 10, 1971 ? ?Progress Note ?Reporting Period 10/25/21 to 12/25/21 ? ?See note below for Objective  Data and Assessment of Progress/Goals.  ? ? Patient is making progress toward her goals for physical therapy as evidenced by her improved AROM at this time. She continued to experience tightness and discomfort at end range. She also demonstrates increased scapular elevation with shoulder flexion. Recommend that she continue with her POC to address her remaining impairments to return to her prior level of function.  ? ?

## 2021-12-28 ENCOUNTER — Ambulatory Visit: Payer: No Typology Code available for payment source | Admitting: Physical Therapy

## 2021-12-28 DIAGNOSIS — M25512 Pain in left shoulder: Secondary | ICD-10-CM | POA: Diagnosis not present

## 2021-12-28 DIAGNOSIS — M25612 Stiffness of left shoulder, not elsewhere classified: Secondary | ICD-10-CM

## 2021-12-28 DIAGNOSIS — G8929 Other chronic pain: Secondary | ICD-10-CM

## 2021-12-28 NOTE — Therapy (Signed)
[place] ?[facility] ?[place] ?[place], [place], [address] ?Phone: [phone]   Fax:  [phone] ? ?Physical Therapy Treatment ? ?Patient Details  ?Name: Katherine Hays ?MRN: [id number] ?Date of Birth: [birth year] ?Referring Provider (PT): Doren Custard, PA-C ? ? ?Encounter Date: 12/28/2021 ? ? PT End of Session - 12/28/21 1022   ? ? Visit Number 22   ? Number of Visits 24   ? Date for PT Re-Evaluation 01/11/22   ? PT Start Time 0900   ? PT Stop Time 0949   ? PT Time Calculation (min) 49 min   ? Activity Tolerance Patient tolerated treatment well   ? Behavior During Therapy [facility] for tasks assessed/performed   ? ?  ?  ? ?  ? ? ?Past Medical History:  ?Diagnosis Date  ? Anxiety   ? Asthma   ? OCD (obsessive compulsive disorder)   ? ? ?Past Surgical History:  ?Procedure Laterality Date  ? TONSILLECTOMY    ? TUBAL LIGATION    ? WISDOM TOOTH EXTRACTION    ? ? ?There were no vitals filed for this visit. ? ? Subjective Assessment - 12/28/21 0959   ? ? Subjective No new complaints.   ? Pertinent History Hypothyroidism   ? Limitations House hold activities;Lifting   ? Currently in Pain? Yes   ? Pain Score 1    ? Pain Orientation Left   ? Pain Descriptors / Indicators Sore   ? Pain Type Surgical pain   ? Pain Onset More than a month ago   ? ?  ?  ? ?  ? ? ? ? ? ? ? ? ? ? ? ? ? ? ? ? ? ? ? ? [place] Adult PT Treatment/Exercise - 12/28/21 0001   ? ?  ? Exercises  ? Exercises Shoulder   ?  ? Shoulder Exercises: Pulleys  ? Flexion 5 minutes   ? Other Pulley Exercises Wallladder x 3 minutes f/b UE Ranger on wall x 3 minutes f/b ball on wall x 3 minutes.   ?  ? Vasopneumatic  ? Number Minutes Vasopneumatic  15 minutes   ? Vasopnuematic Location  --   Left shoulder with pillow between thorax and elbow.  ? Vasopneumatic Pressure Low   ?  ? Manual Therapy  ? Manual Therapy Passive ROM   ? Passive ROM In supine:  Gentle low load long duration stretching x 10 minutes into left shoulder flexion and ER.    ? ?  ?  ? ?  ? ? ? ? ? ? ? ? ? ? ? ? ? ? ? PT Long Term Goals - 12/25/21 1658   ? ?  ? PT LONG TERM GOAL #1  ? Title Patient will be independent with her HEP.   ? Time 4   ? Period Weeks   ? Status On-going   ? Target Date 11/22/21   ?  ? PT LONG TERM GOAL #2  ? Title Patient will be able to demonstrate at least 120 degrees of active left shoulder flexion.   ? Time 4   ? Period Weeks   ? Status On-going   ? Target Date 11/22/21   ?  ? PT LONG TERM GOAL #3  ? Title Patient will be able to carry at least 8 pounds with her left upper extremity without being limited by her left shoulder for improved function carrying her laundry.   ? Time 4   ? Period Weeks   ?  Status On-going   ? Target Date 11/22/21   ?  ? PT LONG TERM GOAL #4  ? Title Patient will be able to demonstrate at least 120 degrees of active left shoulder abduction for improved function reaching overhead.   ? Time 4   ? Period Weeks   ? Status On-going   ? Target Date 11/22/21   ? ?  ?  ? ?  ? ? ? ? ? ? ? ? Plan - 12/28/21 1026   ? ? Clinical Impression Statement Patient did well today.  Needs continued work on range of motion.  Added supine punches and towel/oven mitt on wall exercise.   ? Personal Factors and Comorbidities Other   ? Examination-Activity Limitations Reach Overhead;Carry;Lift   ? Examination-Participation Restrictions Cleaning;Other;Occupation;Community Activity;Laundry   ? Rehab Potential Good   ? PT Frequency 3x / week   ? PT Duration 4 weeks   ? PT Treatment/Interventions Electrical Stimulation;Cryotherapy;Iontophoresis '4mg'$ /ml Dexamethasone;Moist Heat;Neuromuscular re-education;Therapeutic exercise;Therapeutic activities;Patient/family education;Manual techniques;Passive range of motion;Taping;Vasopneumatic Device   ? PT Next Visit Plan Progress per protocol.   ? Consulted and Agree with Plan of Care Patient   ? ?  ?  ? ?  ? ? ?Patient will benefit from skilled therapeutic intervention in order to improve the following deficits and  impairments:  Decreased range of motion, Impaired UE functional use, Decreased activity tolerance, Pain, Hypomobility, Decreased strength ? ?Visit Diagnosis: ?Acute pain of left shoulder ? ?Stiffness of left shoulder, not elsewhere classified ? ?Chronic left shoulder pain ? ? ? ? ?Problem List ?Patient Active Problem List  ? Diagnosis Date Noted  ? Low hemoglobin 11/15/2020  ? Controlled substance agreement signed 02/04/2019  ? Benzodiazepine dependence ([place]) 02/04/2019  ? Gastroesophageal reflux disease 02/04/2019  ? Vitamin D deficiency 11/13/2017  ? Hypothyroidism 11/13/2017  ? Hyperlipemia 11/13/2017  ? Insomnia 11/10/2017  ? History of OCD (obsessive compulsive disorder) 01/12/2014  ? GAD (generalized anxiety disorder) 01/12/2014  ? Inguinal lymphadenopathy 05/26/2012  ? ? ?Katherine Hays, [place], PT ?12/28/2021, 11:09 AM ? ?[place] ?[facility] ?[place] ?[place], [place], [address] ?Phone: [phone]   Fax:  [phone] ? ?Name: Katherine Hays ?MRN: [id number] ?Date of Birth: 25-Sep-1970 ? ? ? ?

## 2021-12-31 ENCOUNTER — Ambulatory Visit: Payer: No Typology Code available for payment source | Admitting: Physical Therapy

## 2021-12-31 ENCOUNTER — Encounter: Payer: Self-pay | Admitting: Physical Therapy

## 2021-12-31 DIAGNOSIS — M25612 Stiffness of left shoulder, not elsewhere classified: Secondary | ICD-10-CM

## 2021-12-31 DIAGNOSIS — G8929 Other chronic pain: Secondary | ICD-10-CM

## 2021-12-31 DIAGNOSIS — M25512 Pain in left shoulder: Secondary | ICD-10-CM

## 2021-12-31 NOTE — Therapy (Signed)
[place] ?[facility] ?[place] ?[place], [place], [address] ?Phone: [phone]   Fax:  [phone] ? ?Physical Therapy Treatment ? ?Patient Details  ?Name: Katherine Hays ?MRN: [id number] ?Date of Birth: Oct 12, 1970 ?Referring Provider (PT): Katherine Custard, PA-C ? ? ?Encounter Date: 12/31/2021 ? ? PT End of Session - 12/31/21 1206   ? ? Visit Number 23   ? Number of Visits 24   ? Date for PT Re-Evaluation 01/11/22   ? PT Start Time 9892   ? PT Stop Time 1043   ? PT Time Calculation (min) 58 min   ? Activity Tolerance Patient tolerated treatment well   ? Behavior During Therapy [facility] for tasks assessed/performed   ? ?  ?  ? ?  ? ? ?Past Medical History:  ?Diagnosis Date  ? Anxiety   ? Asthma   ? OCD (obsessive compulsive disorder)   ? ? ?Past Surgical History:  ?Procedure Laterality Date  ? TONSILLECTOMY    ? TUBAL LIGATION    ? WISDOM TOOTH EXTRACTION    ? ? ?There were no vitals filed for this visit. ? ? Subjective Assessment - 12/31/21 0956   ? ? Subjective Pain a 3/10, think I slept on it wrong.   ? Pertinent History Hypothyroidism   ? Limitations House hold activities;Lifting   ? Patient Stated Goals reduced pain, carry laundry   ? Currently in Pain? Yes   ? Pain Score 3    ? Pain Location Shoulder   ? Pain Orientation Left   ? Pain Descriptors / Indicators Sore   ? Pain Type Surgical pain   ? Pain Onset More than a month ago   ? ?  ?  ? ?  ? ? ? ? ? ? ? ? ? ? ? ? ? ? ? ? ? ? ? ? [place] Adult PT Treatment/Exercise - 12/31/21 0001   ? ?  ? Exercises  ? Exercises Shoulder   ?  ? Shoulder Exercises: Pulleys  ? Flexion --   6 minutes.  ? Other Pulley Exercises Wall ladder x 4 minutes f/b UE Ranger on wall x 4 minutes.   ? Other Pulley Exercises Ball on wall x 4 minutes.   ?  ? Modalities  ? Modalities Electrical Stimulation;Vasopneumatic   ?  ? Electrical Stimulation  ? Electrical Stimulation Location Left shoulder.   ? Electrical Stimulation Action Low-level IFC at 80-150 Hz at 40% scan  x 20 minutes.   ?  ? Vasopneumatic  ? Number Minutes Vasopneumatic  20 minutes   ? Vasopnuematic Location  --   Left shoulder with pillow between thorax and elbow.  ? Vasopneumatic Pressure Low   ?  ? Manual Therapy  ? Manual Therapy Passive ROM   ? Passive ROM In supine:  Gentle range of motion x 12 minutes into flexion and ER x 12 minutes.   ? ?  ?  ? ?  ? ? ? ? ? ? ? ? ? ? ? ? ? ? ? PT Long Term Goals - 12/25/21 1658   ? ?  ? PT LONG TERM GOAL #1  ? Title Patient will be independent with her HEP.   ? Time 4   ? Period Weeks   ? Status On-going   ? Target Date 11/22/21   ?  ? PT LONG TERM GOAL #2  ? Title Patient will be able to demonstrate at least 120 degrees of active left shoulder flexion.   ?  Time 4   ? Period Weeks   ? Status On-going   ? Target Date 11/22/21   ?  ? PT LONG TERM GOAL #3  ? Title Patient will be able to carry at least 8 pounds with her left upper extremity without being limited by her left shoulder for improved function carrying her laundry.   ? Time 4   ? Period Weeks   ? Status On-going   ? Target Date 11/22/21   ?  ? PT LONG TERM GOAL #4  ? Title Patient will be able to demonstrate at least 120 degrees of active left shoulder abduction for improved function reaching overhead.   ? Time 4   ? Period Weeks   ? Status On-going   ? Target Date 11/22/21   ? ?  ?  ? ?  ? ? ? ? ? ? ? ? Plan - 12/31/21 1211   ? ? Clinical Impression Statement The patient with some increased pain today and feels it may have been due to sleeping on left  side some last night.  Great job with ther ex today.  She still has limitation of left shoulder elevation against gravity.   ? Personal Factors and Comorbidities Other   ? Examination-Activity Limitations Reach Overhead;Carry;Lift   ? Examination-Participation Restrictions Cleaning;Other;Occupation;Community Activity;Laundry   ? Stability/Clinical Decision Making Evolving/Moderate complexity   ? Rehab Potential Good   ? PT Frequency 3x / week   ? PT Duration 4 weeks    ? PT Treatment/Interventions Electrical Stimulation;Cryotherapy;Iontophoresis '4mg'$ /ml Dexamethasone;Moist Heat;Neuromuscular re-education;Therapeutic exercise;Therapeutic activities;Patient/family education;Manual techniques;Passive range of motion;Taping;Vasopneumatic Device   ? PT Next Visit Plan Progress per protocol.   ? Consulted and Agree with Plan of Care Patient   ? ?  ?  ? ?  ? ? ?Patient will benefit from skilled therapeutic intervention in order to improve the following deficits and impairments:  Decreased range of motion, Impaired UE functional use, Decreased activity tolerance, Pain, Hypomobility, Decreased strength ? ?Visit Diagnosis: ?Acute pain of left shoulder ? ?Stiffness of left shoulder, not elsewhere classified ? ?Chronic left shoulder pain ? ? ? ? ?Problem List ?Patient Active Problem List  ? Diagnosis Date Noted  ? Low hemoglobin 11/15/2020  ? Controlled substance agreement signed 02/04/2019  ? Benzodiazepine dependence ([place]) 02/04/2019  ? Gastroesophageal reflux disease 02/04/2019  ? Vitamin D deficiency 11/13/2017  ? Hypothyroidism 11/13/2017  ? Hyperlipemia 11/13/2017  ? Insomnia 11/10/2017  ? History of OCD (obsessive compulsive disorder) 01/12/2014  ? GAD (generalized anxiety disorder) 01/12/2014  ? Inguinal lymphadenopathy 05/26/2012  ? ? ?Katherine Hays, Katherine Hays, PT ?12/31/2021, 12:14 PM ? ?[place] ?[facility] ?[address] ?[place], [place], [address] ?Phone: [phone]   Fax:  [phone] ? ?Name: Katherine Hays ?MRN: [id number] ?Date of Birth: 03/27/1971 ? ? ? ?

## 2022-01-03 ENCOUNTER — Ambulatory Visit: Payer: No Typology Code available for payment source | Admitting: Physical Therapy

## 2022-01-03 ENCOUNTER — Encounter: Payer: Self-pay | Admitting: Physical Therapy

## 2022-01-03 DIAGNOSIS — M25612 Stiffness of left shoulder, not elsewhere classified: Secondary | ICD-10-CM

## 2022-01-03 DIAGNOSIS — M25512 Pain in left shoulder: Secondary | ICD-10-CM | POA: Diagnosis not present

## 2022-01-03 NOTE — Therapy (Signed)
[facility] [place], [place], [address] Phone: [phone]   Fax:  [phone]  Physical Therapy Treatment  Patient Details  Name: Katherine Hays MRN: [id number] Date of Birth: [birth year] Referring Provider (PT): Doren Custard, [place]   Encounter Date: 01/03/2022   PT End of Session - 01/03/22 1656     Visit Number 24    Number of Visits 24    Date for PT Re-Evaluation 01/11/22    PT Start Time 9169    PT Stop Time 1730    PT Time Calculation (min) 37 min    Activity Tolerance Patient tolerated treatment well    Behavior During Therapy [facility] for tasks assessed/performed             Past Medical History:  Diagnosis Date   Anxiety    Asthma    OCD (obsessive compulsive disorder)     Past Surgical History:  Procedure Laterality Date   TONSILLECTOMY     TUBAL LIGATION     WISDOM TOOTH EXTRACTION      There were no vitals filed for this visit.   Subjective Assessment - 01/03/22 1655     Subjective Hurting some today but thinks its from where she is back at work and has limited oppurtunity to do exercises.    Pertinent History Hypothyroidism    Limitations House hold activities;Lifting    Patient Stated Goals reduced pain, carry laundry    Currently in Pain? Yes    Pain Score 2     Pain Location Shoulder    Pain Orientation Left    Pain Descriptors / Indicators Sore    Pain Type Surgical pain    Pain Onset More than a month ago    Pain Frequency Intermittent                [facility] PT Assessment - 01/03/22 0001       Assessment   Medical Diagnosis Left shoulder scope    Referring Provider (PT) Doren Custard, PA-C    Onset Date/Surgical Date 10/22/21    Hand Dominance Right    Next MD Visit 01/15/2022    Prior Therapy Yes      Precautions   Precautions Shoulder      Restrictions   Weight Bearing Restrictions No                           [facility] Adult PT Treatment/Exercise - 01/03/22  0001       Shoulder Exercises: Standing   Flexion AAROM;Left;20 reps      Shoulder Exercises: Pulleys   Flexion 5 minutes    Other Pulley Exercises Wall ladder x 4 minutes    Other Pulley Exercises Ball on wall x 2 minutes.      Shoulder Exercises: Isometric Strengthening   Flexion 5X5"    External Rotation 5X5"    Internal Rotation 5X5"      Modalities   Modalities Vasopneumatic      Vasopneumatic   Number Minutes Vasopneumatic  10 minutes    Vasopnuematic Location  Shoulder    Vasopneumatic Pressure Low    Vasopneumatic Temperature  34                          PT Long Term Goals - 01/03/22 1722       PT LONG TERM GOAL #1   Title Patient will be independent with her  HEP.    Time 4    Period Weeks    Status Achieved    Target Date 11/22/21      PT LONG TERM GOAL #2   Title Patient will be able to demonstrate at least 120 degrees of active left shoulder flexion.    Time 4    Period Weeks    Status On-going    Target Date 11/22/21      PT LONG TERM GOAL #3   Title Patient will be able to carry at least 8 pounds with her left upper extremity without being limited by her left shoulder for improved function carrying her laundry.    Time 4    Period Weeks    Status On-going    Target Date 11/22/21      PT LONG TERM GOAL #4   Title Patient will be able to demonstrate at least 120 degrees of active left shoulder abduction for improved function reaching overhead.    Time 4    Period Weeks    Status On-going    Target Date 11/22/21                   Plan - 01/03/22 1725     Clinical Impression Statement Patient presented in clinic with reports of some discomfort but reports more discomfort after returning to work with limited chance to complete HEP. Patient progressed to more assisted antigravity ROM exercises with discomfort towards end range. Patient also introduced to light isometrics with mild facial grimacing. Normal vasopnuematic  response noted following removal of the modality.    Personal Factors and Comorbidities Other    Examination-Activity Limitations Reach Overhead;Carry;Lift    Examination-Participation Restrictions Cleaning;Other;Occupation;Community Activity;Laundry    Stability/Clinical Decision Making Evolving/Moderate complexity    Rehab Potential Good    PT Frequency 3x / week    PT Duration 4 weeks    PT Treatment/Interventions Electrical Stimulation;Cryotherapy;Iontophoresis '4mg'$ /ml Dexamethasone;Moist Heat;Neuromuscular re-education;Therapeutic exercise;Therapeutic activities;Patient/family education;Manual techniques;Passive range of motion;Taping;Vasopneumatic Device    PT Next Visit Plan Progress per protocol.    Consulted and Agree with Plan of Care Patient             Patient will benefit from skilled therapeutic intervention in order to improve the following deficits and impairments:  Decreased range of motion, Impaired UE functional use, Decreased activity tolerance, Pain, Hypomobility, Decreased strength  Visit Diagnosis: Acute pain of left shoulder  Stiffness of left shoulder, not elsewhere classified     Problem List Patient Active Problem List   Diagnosis Date Noted   Low hemoglobin 11/15/2020   Controlled substance agreement signed 02/04/2019   Benzodiazepine dependence ([place]) 02/04/2019   Gastroesophageal reflux disease 02/04/2019   Vitamin D deficiency 11/13/2017   Hypothyroidism 11/13/2017   Hyperlipemia 11/13/2017   Insomnia 11/10/2017   History of OCD (obsessive compulsive disorder) 01/12/2014   GAD (generalized anxiety disorder) 01/12/2014   Inguinal lymphadenopathy 05/26/2012    Standley Brooking, PTA 01/03/2022, 5:39 PM  [facility] [address] [place], [place], [address] Phone: [phone]   Fax:  [phone]  Name: Katherine Hays MRN: [id number] Date of Birth: [birth year]

## 2022-01-08 ENCOUNTER — Encounter: Payer: Self-pay | Admitting: Nurse Practitioner

## 2022-01-08 ENCOUNTER — Ambulatory Visit (INDEPENDENT_AMBULATORY_CARE_PROVIDER_SITE_OTHER): Payer: No Typology Code available for payment source | Admitting: Nurse Practitioner

## 2022-01-08 ENCOUNTER — Ambulatory Visit: Payer: No Typology Code available for payment source

## 2022-01-08 VITALS — BP 95/65 | HR 88 | Temp 97.3°F | Resp 20 | Ht 67.0 in | Wt 169.0 lb

## 2022-01-08 DIAGNOSIS — M25612 Stiffness of left shoulder, not elsewhere classified: Secondary | ICD-10-CM

## 2022-01-08 DIAGNOSIS — R599 Enlarged lymph nodes, unspecified: Secondary | ICD-10-CM

## 2022-01-08 DIAGNOSIS — M25512 Pain in left shoulder: Secondary | ICD-10-CM | POA: Diagnosis not present

## 2022-01-08 LAB — CULTURE, GROUP A STREP

## 2022-01-08 LAB — RAPID STREP SCREEN (MED CTR MEBANE ONLY): Strep Gp A Ag, IA W/Reflex: NEGATIVE

## 2022-01-08 MED ORDER — AMOXICILLIN 875 MG PO TABS: 875.0000 mg | ORAL_TABLET | Freq: Two times a day (BID) | ORAL | 0 refills | Status: DC

## 2022-01-08 NOTE — Therapy (Signed)
[facility] [place], [place], [address] Phone: [phone]   Fax:  [phone]  Physical Therapy Treatment  Patient Details  Name: Katherine Hays MRN: [id number] Date of Birth: 15-Oct-1970 Referring Provider (PT): Doren Custard, [place]   Encounter Date: 01/08/2022   PT End of Session - 01/08/22 1647     Visit Number 25    Number of Visits 30    Date for PT Re-Evaluation 02/15/22    PT Start Time 1642    PT Stop Time 1730    PT Time Calculation (min) 48 min    Activity Tolerance Patient tolerated treatment well    Behavior During Therapy [facility] for tasks assessed/performed             Past Medical History:  Diagnosis Date   Anxiety    Asthma    OCD (obsessive compulsive disorder)     Past Surgical History:  Procedure Laterality Date   TONSILLECTOMY     TUBAL LIGATION     WISDOM TOOTH EXTRACTION      There were no vitals filed for this visit.   Subjective Assessment - 01/08/22 1644     Subjective Patient reports that her shoulder is hurting today. She notes that it is mainly annoying as her pain is a 1-2/10.    Pertinent History Hypothyroidism    Limitations House hold activities;Lifting    Patient Stated Goals reduced pain, carry laundry    Currently in Pain? Yes    Pain Score 2     Pain Location Shoulder    Pain Orientation Left    Pain Onset More than a month ago                [facility] PT Assessment - 01/08/22 0001       AROM   Left Shoulder Flexion 139 Degrees   felt "ok"   Left Shoulder ABduction 127 Degrees   pain returning to neutral                          [facility] Adult PT Treatment/Exercise - 01/08/22 0001       Shoulder Exercises: Standing   External Rotation Left;20 reps;Theraband    Theraband Level (Shoulder External Rotation) Level 1 (Yellow)    Internal Rotation Left;Theraband   2 minutes   Theraband Level (Shoulder Internal Rotation) Level 2 (Red)      Shoulder  Exercises: Pulleys   Flexion 5 minutes    Other Pulley Exercises Wall ladder x 10 reps   #28 max     Shoulder Exercises: ROM/Strengthening   [place] standing flexion   2 minutes   Other ROM/Strengthening Exercises Horizontal ABD stretch   2 x 60" seconds     Shoulder Exercises: Stretch   External Rotation Stretch 4 reps;30 seconds    Other Shoulder Stretches Doorway stretch   4 x 30 seconds   Other Shoulder Stretches Upper trapezius stretch   with L shoulder stabilized; 4 x 30"     Modalities   Modalities Vasopneumatic      Vasopneumatic   Number Minutes Vasopneumatic  10 minutes    Vasopnuematic Location  Shoulder    Vasopneumatic Pressure Low    Vasopneumatic Temperature  34                          PT Long Term Goals - 01/08/22 1717  PT LONG TERM GOAL #1   Title Patient will be independent with her HEP.    Baseline has not been able to do her HEP as much since she returned to work    Time 4    Period Weeks    Status On-going    Target Date 11/22/21      PT LONG TERM GOAL #2   Title Patient will be able to demonstrate at least 120 degrees of active left shoulder flexion.    Time 4    Period Weeks    Status Achieved    Target Date 11/22/21      PT LONG TERM GOAL #3   Title Patient will be able to carry at least 8 pounds with her left upper extremity without being limited by her left shoulder for improved function carrying her laundry.    Time 4    Period Weeks    Status On-going    Target Date 11/22/21      PT LONG TERM GOAL #4   Title Patient will be able to demonstrate at least 120 degrees of active left shoulder abduction for improved function reaching overhead.    Time 4    Period Weeks    Status Achieved    Target Date 11/22/21                   Plan - 01/08/22 1712     Clinical Impression Statement Patient was introduced to multiple new interventions for improved left shoulder mobility. She required minimal cueing with  resisted shoulder internal and external rotation to limit lumbar rotation to facilitate rotator cuff engagement. She reported that most significant pain and discomfort with eccentric control of her shoulder primarily with flexion and abduction. This was able to be slightly improved throughout treatment today, but she continued to experience intermittent pain with this at the end of today's active interventions. She reported that her shoulder felt good upon the conclusion of treatment. She continues to require skilled physical therapy to address her remaining impairments to return to her prior level of function.    Personal Factors and Comorbidities Other    Examination-Activity Limitations Reach Overhead;Carry;Lift    Examination-Participation Restrictions Cleaning;Other;Occupation;Community Activity;Laundry    Stability/Clinical Decision Making Evolving/Moderate complexity    Rehab Potential Good    PT Frequency 3x / week    PT Duration 4 weeks    PT Treatment/Interventions Electrical Stimulation;Cryotherapy;Iontophoresis '4mg'$ /ml Dexamethasone;Moist Heat;Neuromuscular re-education;Therapeutic exercise;Therapeutic activities;Patient/family education;Manual techniques;Passive range of motion;Taping;Vasopneumatic Device    PT Next Visit Plan Progress per protocol.    Consulted and Agree with Plan of Care Patient             Patient will benefit from skilled therapeutic intervention in order to improve the following deficits and impairments:  Decreased range of motion, Impaired UE functional use, Decreased activity tolerance, Pain, Hypomobility, Decreased strength  Visit Diagnosis: Acute pain of left shoulder  Stiffness of left shoulder, not elsewhere classified     Problem List Patient Active Problem List   Diagnosis Date Noted   Low hemoglobin 11/15/2020   Controlled substance agreement signed 02/04/2019   Benzodiazepine dependence ([place]) 02/04/2019   Gastroesophageal reflux disease  02/04/2019   Vitamin D deficiency 11/13/2017   Hypothyroidism 11/13/2017   Hyperlipemia 11/13/2017   Insomnia 11/10/2017   History of OCD (obsessive compulsive disorder) 01/12/2014   GAD (generalized anxiety disorder) 01/12/2014   Inguinal lymphadenopathy 05/26/2012    Darlin Coco, PT 01/08/2022, 5:46 PM  [facility] [place], [place], [address] Phone: [phone]   Fax:  [phone]  Name: Katherine Hays MRN: [id number] Date of Birth: May 22, 1971

## 2022-01-08 NOTE — Progress Notes (Signed)
   Subjective:    Patient ID: Katherine Hays, female    DOB: [birth year], 51 y.o.   MRN: [id number]   Chief Complaint: swollen lymph node right side of neck   HPI Patient says that she has a swollen painful lymph node on right side of neck. Noticed it last night. Rates pain 6/10.     Review of Systems  Constitutional:  Negative for chills and fever.  HENT: Negative.  Negative for postnasal drip, sore throat, trouble swallowing and voice change.   Respiratory: Negative.    Cardiovascular: Negative.       Objective:   Physical Exam Vitals reviewed.  Constitutional:      Appearance: Normal appearance.  HENT:     Right Ear: Tympanic membrane normal.     Left Ear: Tympanic membrane normal.     Nose: Nose normal.     Mouth/Throat:     Mouth: Mucous membranes are moist.  Eyes:     Pupils: Pupils are equal, round, and reactive to light.  Cardiovascular:     Heart sounds: Normal heart sounds.  Pulmonary:     Effort: Pulmonary effort is normal.     Breath sounds: Normal breath sounds.  Musculoskeletal:     Cervical back: No rigidity.  Lymphadenopathy:     Cervical: Cervical adenopathy (right anterior chain with pain on palpation) present.  Skin:    General: Skin is warm.  Neurological:     General: No focal deficit present.     Mental Status: She is alert and oriented to person, place, and time.  Psychiatric:        Mood and Affect: Mood normal.        Behavior: Behavior normal.   BP 95/65   Pulse 88   Temp (!) 97.3 F (36.3 C) (Temporal)   Resp 20   Ht '5\' 7"'$  (1.702 m)   Wt 169 lb (76.7 kg)   SpO2 100%   BMI 26.47 kg/m   Strep negative       Assessment & Plan:   Katherine Hays in today with chief complaint of swollen lymph node right side of neck   1. Swollen lymph nodes Ice as needed Tylenol OTC - Rapid Strep Screen ([facility] ONLY) RTO if not improving  Meds ordered this encounter  Medications   amoxicillin (AMOXIL) 875 MG tablet     Sig: Take 1 tablet (875 mg total) by mouth 2 (two) times daily. 1 po BID    Dispense:  14 tablet    Refill:  0    Order Specific Question:   Supervising Provider    Answer:   Caryl Pina A [[id number]]      The above assessment and management plan was discussed with the patient. The patient verbalized understanding of and has agreed to the management plan. Patient is aware to call the clinic if symptoms persist or worsen. Patient is aware when to return to the clinic for a follow-up visit. Patient educated on when it is appropriate to go to the emergency department.   Mary-Margaret Hassell Done, FNP

## 2022-01-10 ENCOUNTER — Encounter: Payer: Self-pay | Admitting: Physical Therapy

## 2022-01-10 ENCOUNTER — Ambulatory Visit: Payer: No Typology Code available for payment source | Admitting: Physical Therapy

## 2022-01-10 ENCOUNTER — Other Ambulatory Visit: Payer: Self-pay | Admitting: Nurse Practitioner

## 2022-01-10 DIAGNOSIS — M25612 Stiffness of left shoulder, not elsewhere classified: Secondary | ICD-10-CM

## 2022-01-10 DIAGNOSIS — M25512 Pain in left shoulder: Secondary | ICD-10-CM | POA: Diagnosis not present

## 2022-01-10 MED ORDER — PREDNISONE 20 MG PO TABS: 40.0000 mg | ORAL_TABLET | Freq: Every day | ORAL | 0 refills | Status: AC

## 2022-01-10 NOTE — Therapy (Signed)
[facility] [place], [place], [address] Phone: [phone]   Fax:  [phone]  Physical Therapy Treatment  Patient Details  Name: Katherine Hays MRN: [id number] Date of Birth: 1970/11/24 Referring Provider (PT): Doren Custard, [place]   Encounter Date: 01/10/2022   PT End of Session - 01/10/22 1728     Visit Number 26    Number of Visits 30    Date for PT Re-Evaluation 02/15/22    PT Start Time 3810    PT Stop Time 1732    PT Time Calculation (min) 45 min    Activity Tolerance Patient tolerated treatment well    Behavior During Therapy [facility] for tasks assessed/performed             Past Medical History:  Diagnosis Date   Anxiety    Asthma    OCD (obsessive compulsive disorder)     Past Surgical History:  Procedure Laterality Date   TONSILLECTOMY     TUBAL LIGATION     WISDOM TOOTH EXTRACTION      There were no vitals filed for this visit.   Subjective Assessment - 01/10/22 1637     Subjective Patient reports that her shoulder is hurting today. She notes that it is mainly annoying as her pain is a 1-2/10.    Pertinent History Hypothyroidism    Limitations House hold activities;Lifting    Patient Stated Goals reduced pain, carry laundry    Currently in Pain? Yes    Pain Score 1     Pain Location Shoulder    Pain Orientation Left    Pain Descriptors / Indicators Discomfort    Pain Type Surgical pain    Pain Onset More than a month ago    Pain Frequency Intermittent                [facility] PT Assessment - 01/10/22 0001       Assessment   Medical Diagnosis Left shoulder scope    Referring Provider (PT) Doren Custard, PA-C    Onset Date/Surgical Date 10/22/21    Hand Dominance Right    Next MD Visit 01/15/2022    Prior Therapy Yes      Precautions   Precautions Shoulder      ROM / Strength   AROM / PROM / Strength AROM      AROM   Overall AROM  Within functional limits for tasks performed;Deficits     AROM Assessment Site Shoulder    Right/Left Shoulder Left    Left Shoulder Flexion 140 Degrees    Left Shoulder External Rotation 37 Degrees                           [facility] Adult PT Treatment/Exercise - 01/10/22 0001       Shoulder Exercises: Supine   Protraction AROM;Left;10 reps    Flexion AROM;Left;15 reps      Shoulder Exercises: Sidelying   External Rotation AROM;Left;20 reps    Flexion AROM;Left;5 reps   reported as pain     Shoulder Exercises: Standing   External Rotation Strengthening;Left;20 reps;Theraband    Theraband Level (Shoulder External Rotation) Level 1 (Yellow)   more guidance for technique   Internal Rotation Strengthening;Left;20 reps;Theraband    Theraband Level (Shoulder Internal Rotation) Level 2 (Red)    Flexion Limitations   Attempted with 1# handweight but too difficult   Extension Strengthening;Left;20 reps;Theraband    Theraband Level (Shoulder Extension) Level  2 (Red)    Row Strengthening;Left;20 reps;Theraband    Theraband Level (Shoulder Row) Level 2 (Red)    Other Standing Exercises L bicep curls 2# x20 reps      Shoulder Exercises: Pulleys   Flexion 5 minutes    Other Pulley Exercises Wall ladder x 10 reps    Other Pulley Exercises Ball on wall x 2 minutes.      Modalities   Modalities Vasopneumatic      Vasopneumatic   Number Minutes Vasopneumatic  10 minutes    Vasopnuematic Location  Shoulder    Vasopneumatic Pressure Low    Vasopneumatic Temperature  34      Manual Therapy   Manual Therapy Passive ROM    Passive ROM PROM of L shoulder into flex, ER/IR with gentle holds at end range                          PT Long Term Goals - 01/08/22 1717       PT LONG TERM GOAL #1   Title Patient will be independent with her HEP.    Baseline has not been able to do her HEP as much since she returned to work    Time 4    Period Weeks    Status On-going    Target Date 11/22/21      PT LONG TERM GOAL  #2   Title Patient will be able to demonstrate at least 120 degrees of active left shoulder flexion.    Time 4    Period Weeks    Status Achieved    Target Date 11/22/21      PT LONG TERM GOAL #3   Title Patient will be able to carry at least 8 pounds with her left upper extremity without being limited by her left shoulder for improved function carrying her laundry.    Time 4    Period Weeks    Status On-going    Target Date 11/22/21      PT LONG TERM GOAL #4   Title Patient will be able to demonstrate at least 120 degrees of active left shoulder abduction for improved function reaching overhead.    Time 4    Period Weeks    Status Achieved    Target Date 11/22/21                   Plan - 01/10/22 1728     Clinical Impression Statement Patient presented in clinic with very minimal pain. Patient was progressed through light strengthening in which theraband and handweights were utilized. Standing shoulder flexion attempted in standing but unable due to discomfort. Some discomfort also reported by patient with supine flexion. Overall limited reps completed in supine and SL for flexion due to discomfort. WNL SL ER noted but limited with AROM in supine via the ROM measurements which were then followed by PROM. Normal vasopneumatic response noted following removal of the modality.    Personal Factors and Comorbidities Other    Examination-Activity Limitations Reach Overhead;Carry;Lift    Examination-Participation Restrictions Cleaning;Other;Occupation;Community Activity;Laundry    Stability/Clinical Decision Making Evolving/Moderate complexity    Rehab Potential Good    PT Frequency 3x / week    PT Duration 4 weeks    PT Treatment/Interventions Electrical Stimulation;Cryotherapy;Iontophoresis '4mg'$ /ml Dexamethasone;Moist Heat;Neuromuscular re-education;Therapeutic exercise;Therapeutic activities;Patient/family education;Manual techniques;Passive range of motion;Taping;Vasopneumatic  Device    PT Next Visit Plan Progress per protocol.    Consulted and Agree with  Plan of Care Patient             Patient will benefit from skilled therapeutic intervention in order to improve the following deficits and impairments:  Decreased range of motion, Impaired UE functional use, Decreased activity tolerance, Pain, Hypomobility, Decreased strength  Visit Diagnosis: Acute pain of left shoulder  Stiffness of left shoulder, not elsewhere classified     Problem List Patient Active Problem List   Diagnosis Date Noted   Low hemoglobin 11/15/2020   Controlled substance agreement signed 02/04/2019   Benzodiazepine dependence ([place]) 02/04/2019   Gastroesophageal reflux disease 02/04/2019   Vitamin D deficiency 11/13/2017   Hypothyroidism 11/13/2017   Hyperlipemia 11/13/2017   Insomnia 11/10/2017   History of OCD (obsessive compulsive disorder) 01/12/2014   GAD (generalized anxiety disorder) 01/12/2014   Inguinal lymphadenopathy 05/26/2012    Standley Brooking, PTA 01/10/2022, 5:47 PM  [facility] [address] [place], [place], [address] Phone: [phone]   Fax:  [phone]  Name: Katherine Hays MRN: [id number] Date of Birth: 1971/08/10

## 2022-01-17 ENCOUNTER — Encounter: Payer: Self-pay | Admitting: Physical Therapy

## 2022-01-17 ENCOUNTER — Ambulatory Visit: Payer: No Typology Code available for payment source | Attending: Physician Assistant | Admitting: Physical Therapy

## 2022-01-17 DIAGNOSIS — M25512 Pain in left shoulder: Secondary | ICD-10-CM | POA: Insufficient documentation

## 2022-01-17 DIAGNOSIS — G8929 Other chronic pain: Secondary | ICD-10-CM | POA: Diagnosis present

## 2022-01-17 DIAGNOSIS — M25612 Stiffness of left shoulder, not elsewhere classified: Secondary | ICD-10-CM | POA: Diagnosis present

## 2022-01-17 NOTE — Therapy (Signed)
[facility] [place], [place], [address] Phone: [phone]   Fax:  [phone]  Physical Therapy Treatment  Patient Details  Name: Katherine Hays MRN: [id number] Date of Birth: 14-Nov-1970 Referring Provider (PT): Doren Custard, [place]   Encounter Date: 01/17/2022   PT End of Session - 01/17/22 1750     Visit Number 27    Number of Visits 30    Date for PT Re-Evaluation 02/15/22    PT Start Time 0445    PT Stop Time 0525    PT Time Calculation (min) 40 min    Activity Tolerance Patient tolerated treatment well    Behavior During Therapy [facility] for tasks assessed/performed             Past Medical History:  Diagnosis Date   Anxiety    Asthma    OCD (obsessive compulsive disorder)     Past Surgical History:  Procedure Laterality Date   TONSILLECTOMY     TUBAL LIGATION     WISDOM TOOTH EXTRACTION      There were no vitals filed for this visit.   Subjective Assessment - 01/17/22 1751     Subjective The patient lifted a heavy box of kitty litter two days ago and felt quite intense pain that night.  Pain lower today.  She saw Dr.Norris yesterday and he was very pleased with her progress.    Pertinent History Hypothyroidism    Limitations House hold activities;Lifting    Patient Stated Goals reduced pain, carry laundry    Currently in Pain? Yes    Pain Score 2     Pain Location Shoulder    Pain Orientation Left    Pain Descriptors / Indicators Discomfort    Pain Type Surgical pain    Pain Onset More than a month ago                               [facility] Adult PT Treatment/Exercise - 01/17/22 0001       Exercises   Exercises Shoulder      Shoulder Exercises: Pulleys   Flexion --   4 minutes.   Other Pulley Exercises UE Ranger on wall x 1 minute.      Modalities   Modalities Merchandiser, retail Stimulation Location  Left lateral shoulder region.    Electrical Stimulation Action Low-level pre-mod at 80-150 Hz x 20 minutes.      Ultrasound   Ultrasound Location Left lateral shoulder    Ultrasound Parameters Combo e'stim/US at 1.50 W/CM2 x 8 minutes.      Vasopneumatic   Number Minutes Vasopneumatic  20 minutes    Vasopnuematic Location  --   Left shoulder with pillow between thorax and elbow.   Vasopneumatic Pressure Low                          PT Long Term Goals - 01/08/22 1717       PT LONG TERM GOAL #1   Title Patient will be independent with her HEP.    Baseline has not been able to do her HEP as much since she returned to work    Time 4    Period Weeks    Status On-going    Target Date 11/22/21      PT LONG TERM GOAL #2   Title  Patient will be able to demonstrate at least 120 degrees of active left shoulder flexion.    Time 4    Period Weeks    Status Achieved    Target Date 11/22/21      PT LONG TERM GOAL #3   Title Patient will be able to carry at least 8 pounds with her left upper extremity without being limited by her left shoulder for improved function carrying her laundry.    Time 4    Period Weeks    Status On-going    Target Date 11/22/21      PT LONG TERM GOAL #4   Title Patient will be able to demonstrate at least 120 degrees of active left shoulder abduction for improved function reaching overhead.    Time 4    Period Weeks    Status Achieved    Target Date 11/22/21                   Plan - 01/17/22 1802     Clinical Impression Statement The patient had a flare-up two days ago after lifting a box of kitty litter.  She c/o pain over her left lateral shoulder region.  When performing te UE Ranger after one minute her lateral shoulder pain was reproduced so we stopped this activity and continued with a conservative treatment.  She tolerated combo e'stim and low-level pre-mod electrical stimulation without complaint.    Personal Factors and  Comorbidities Other    Examination-Activity Limitations Reach Overhead;Carry;Lift    Examination-Participation Restrictions Cleaning;Other;Occupation;Community Activity;Laundry    Stability/Clinical Decision Making Evolving/Moderate complexity    Rehab Potential Good    PT Frequency 3x / week    PT Duration 4 weeks    PT Treatment/Interventions Electrical Stimulation;Cryotherapy;Iontophoresis '4mg'$ /ml Dexamethasone;Moist Heat;Neuromuscular re-education;Therapeutic exercise;Therapeutic activities;Patient/family education;Manual techniques;Passive range of motion;Taping;Vasopneumatic Device    PT Next Visit Plan Progress per protocol.    Consulted and Agree with Plan of Care Patient             Patient will benefit from skilled therapeutic intervention in order to improve the following deficits and impairments:  Decreased range of motion, Impaired UE functional use, Decreased activity tolerance, Pain, Hypomobility, Decreased strength  Visit Diagnosis: Stiffness of left shoulder, not elsewhere classified  Chronic left shoulder pain     Problem List Patient Active Problem List   Diagnosis Date Noted   Low hemoglobin 11/15/2020   Controlled substance agreement signed 02/04/2019   Benzodiazepine dependence ([place]) 02/04/2019   Gastroesophageal reflux disease 02/04/2019   Vitamin D deficiency 11/13/2017   Hypothyroidism 11/13/2017   Hyperlipemia 11/13/2017   Insomnia 11/10/2017   History of OCD (obsessive compulsive disorder) 01/12/2014   GAD (generalized anxiety disorder) 01/12/2014   Inguinal lymphadenopathy 05/26/2012    Melissaann Dizdarevic, [place], PT 01/17/2022, 6:06 PM  [facility] [address] [place], [place], [address] Phone: [phone]   Fax:  [phone]  Name: Katherine Hays MRN: [id number] Date of Birth: 29-Jul-1971

## 2022-01-22 ENCOUNTER — Other Ambulatory Visit (HOSPITAL_COMMUNITY): Payer: Self-pay

## 2022-01-23 ENCOUNTER — Other Ambulatory Visit (HOSPITAL_COMMUNITY): Payer: Self-pay

## 2022-01-24 ENCOUNTER — Ambulatory Visit: Payer: No Typology Code available for payment source | Admitting: Physical Therapy

## 2022-01-24 DIAGNOSIS — M25512 Pain in left shoulder: Secondary | ICD-10-CM

## 2022-01-24 DIAGNOSIS — M25612 Stiffness of left shoulder, not elsewhere classified: Secondary | ICD-10-CM

## 2022-01-24 DIAGNOSIS — G8929 Other chronic pain: Secondary | ICD-10-CM

## 2022-01-24 NOTE — Patient Instructions (Signed)
[place] by [place] Tarance Balan Jun 8th, 2023 View at [email or web address] using code: [id number] Total 3 Page 1 of 2 PECTORALIS CORNER STRETCH While standing at a corner of a wall, place your arms on the walls with elobws bent so that your upper arms are horizontal and your forearms are directed upwards as shown. Take one step forward towards the corner. Bend your front knee until a stretch is felt along the front of your chest and/or shoulders. Your arms should be pointed downward towards the ground. NOTE: Your legs should control the stretch by bending or straightening your front knee. Repeat 4 Times Hold 30 Seconds Complete 1 Set Perform 3 Times a Day Shoulder Flexion Wall Walking Exercise With body facing towards wall, bring your affected arm in front of your body. With elbow bent, place forearm against wall with hand at about eye level and palm facing toward you. Slowly slide affected arm up the wall and gradually start walking body closer to the wall. Once you start to feel a stretch in the affected shoulder and have reached the top of your tolerated range of motion, hold for 5-10 seconds in this position, then slowly start to slide affected arm back down the wall and gradually step backward to your starting position. Repeat 10 Times Hold 10 Seconds Complete 2 Sets Perform 3 Times a Day INTERNAL ROTATION TOWEL STRETCH - IR TOWEL Gently pull up your affected arm behind your back with the assist of a towel Repeat 1 Time Hold 1 Minute Complete 2 Sets Perform 3 Times a Day Created

## 2022-01-24 NOTE — Therapy (Signed)
[facility] [place], [place], [address] Phone: [phone]   Fax:  [phone]  Physical Therapy Treatment  Patient Details  Name: Katherine Hays MRN: [id number] Date of Birth: [birth year] Referring Provider (PT): Doren Custard, [place]   Encounter Date: 01/24/2022   PT End of Session - 01/24/22 1719     Visit Number 28    Number of Visits 30    Date for PT Re-Evaluation 02/15/22    PT Start Time 0439    PT Stop Time 0528    PT Time Calculation (min) 49 min    Activity Tolerance Patient tolerated treatment well    Behavior During Therapy [facility] for tasks assessed/performed             Past Medical History:  Diagnosis Date   Anxiety    Asthma    OCD (obsessive compulsive disorder)     Past Surgical History:  Procedure Laterality Date   TONSILLECTOMY     TUBAL LIGATION     WISDOM TOOTH EXTRACTION      There were no vitals filed for this visit.   Subjective Assessment - 01/24/22 1720     Subjective Doing better.    Pertinent History Hypothyroidism    Limitations House hold activities;Lifting    Patient Stated Goals reduced pain, carry laundry    Currently in Pain? Yes    Pain Score 2     Pain Location Shoulder    Pain Orientation Left    Pain Descriptors / Indicators Discomfort    Pain Type Surgical pain    Pain Onset More than a month ago                               [facility] Adult PT Treatment/Exercise - 01/24/22 0001       Shoulder Exercises: Standing   Other Standing Exercises Left shoulder ER using pulleys x 3 minutes f/b towel stretch x 1 minute f/b corner stretch x 2 minutes f/b flexion strecth up wall x 1 minute.    Other Standing Exercises RW4 with yellow theraband.      Shoulder Exercises: Pulleys   Flexion 5 minutes      Vasopneumatic   Number Minutes Vasopneumatic  15 minutes    Vasopnuematic Location  --   Left shoulder with pillow between thorax and elbow.    Vasopneumatic Pressure Low      Manual Therapy   Manual Therapy Passive ROM    Passive ROM In supine:  PROM into left shoulder flexion and ER x 10 minutes which included a 2 minutes anterior capsule stretch.                     PT Education - 01/24/22 1723     Education Details RW4 with yellow theraband, corner stretch, towel stretch and flexion stretch, full can, ER with pulleys.    Person(s) Educated Patient    Methods Explanation;Demonstration;Handout    Comprehension Returned demonstration;Verbalized understanding                 PT Long Term Goals - 01/08/22 1717       PT LONG TERM GOAL #1   Title Patient will be independent with her HEP.    Baseline has not been able to do her HEP as much since she returned to work    Time 4    Period Weeks    Status  On-going    Target Date 11/22/21      PT LONG TERM GOAL #2   Title Patient will be able to demonstrate at least 120 degrees of active left shoulder flexion.    Time 4    Period Weeks    Status Achieved    Target Date 11/22/21      PT LONG TERM GOAL #3   Title Patient will be able to carry at least 8 pounds with her left upper extremity without being limited by her left shoulder for improved function carrying her laundry.    Time 4    Period Weeks    Status On-going    Target Date 11/22/21      PT LONG TERM GOAL #4   Title Patient will be able to demonstrate at least 120 degrees of active left shoulder abduction for improved function reaching overhead.    Time 4    Period Weeks    Status Achieved    Target Date 11/22/21                   Plan - 01/24/22 1730     Clinical Impression Statement Shoulder feeling much better since lifting kitty litter.  Patient did well today adding additional stretches to her her HEP.    Personal Factors and Comorbidities Other    Examination-Activity Limitations Reach Overhead;Carry;Lift    Examination-Participation Restrictions  Cleaning;Other;Occupation;Community Activity;Laundry    Stability/Clinical Decision Making Evolving/Moderate complexity    Rehab Potential Good    PT Frequency 3x / week    PT Duration 4 weeks    PT Treatment/Interventions Electrical Stimulation;Cryotherapy;Iontophoresis '4mg'$ /ml Dexamethasone;Moist Heat;Neuromuscular re-education;Therapeutic exercise;Therapeutic activities;Patient/family education;Manual techniques;Passive range of motion;Taping;Vasopneumatic Device    PT Next Visit Plan Progress per protocol.    Consulted and Agree with Plan of Care Patient             Patient will benefit from skilled therapeutic intervention in order to improve the following deficits and impairments:     Visit Diagnosis: Stiffness of left shoulder, not elsewhere classified  Chronic left shoulder pain  Acute pain of left shoulder     Problem List Patient Active Problem List   Diagnosis Date Noted   Low hemoglobin 11/15/2020   Controlled substance agreement signed 02/04/2019   Benzodiazepine dependence ([place]) 02/04/2019   Gastroesophageal reflux disease 02/04/2019   Vitamin D deficiency 11/13/2017   Hypothyroidism 11/13/2017   Hyperlipemia 11/13/2017   Insomnia 11/10/2017   History of OCD (obsessive compulsive disorder) 01/12/2014   GAD (generalized anxiety disorder) 01/12/2014   Inguinal lymphadenopathy 05/26/2012   Rationale for Evaluation and Treatment Rehabilitation.  Bobbye Reinitz, [place], PT 01/24/2022, 5:32 PM  [facility] [place], [place], [address] Phone: [phone]   Fax:  [phone]  Name: Katherine Hays MRN: [id number] Date of Birth: Sep 30, 1970

## 2022-01-31 ENCOUNTER — Ambulatory Visit: Payer: No Typology Code available for payment source | Admitting: Physical Therapy

## 2022-01-31 DIAGNOSIS — G8929 Other chronic pain: Secondary | ICD-10-CM

## 2022-01-31 DIAGNOSIS — M25612 Stiffness of left shoulder, not elsewhere classified: Secondary | ICD-10-CM

## 2022-01-31 NOTE — Therapy (Signed)
[facility] [place], [place], [address] Phone: [phone]   Fax:  [phone]  Physical Therapy Treatment  Patient Details  Name: Katherine Hays MRN: [id number] Date of Birth: December 14, 1970 Referring Provider (PT): Doren Custard, [place]   Encounter Date: 01/31/2022   PT End of Session - 01/31/22 1722     Visit Number 29    Number of Visits 30    Date for PT Re-Evaluation 02/15/22    PT Start Time 0336    PT Stop Time 0430    PT Time Calculation (min) 54 min    Activity Tolerance Patient tolerated treatment well    Behavior During Therapy [facility] for tasks assessed/performed             Past Medical History:  Diagnosis Date   Anxiety    Asthma    OCD (obsessive compulsive disorder)     Past Surgical History:  Procedure Laterality Date   TONSILLECTOMY     TUBAL LIGATION     WISDOM TOOTH EXTRACTION      There were no vitals filed for this visit.   Subjective Assessment - 01/31/22 1746     Subjective Patient having that feeling in her lateral shoulder like she had after liftng the kitty litter.    Pertinent History Hypothyroidism    Limitations House hold activities;Lifting    Patient Stated Goals reduced pain, carry laundry    Currently in Pain? Yes    Pain Score 3     Pain Location Shoulder    Pain Orientation Left    Pain Type Surgical pain    Pain Onset More than a month ago                               [facility] Adult PT Treatment/Exercise - 01/31/22 0001       Exercises   Exercises Shoulder      Shoulder Exercises: Pulleys   Flexion 5 minutes      Modalities   Modalities Electrical Stimulation;Vasopneumatic;Ultrasound      Electrical Stimulation   Electrical Stimulation Location Left lateral shoulder.    Electrical Stimulation Action Low-level IFC at 80-150 Hz.    Electrical Stimulation Parameters 40% scan x 20 minutes.    Electrical Stimulation Goals Pain      Ultrasound    Ultrasound Location Lefdt lateral shoulder/middle deltoid.    Ultrasound Parameters Combo e'stim/US at 1.50 W/CM2 x 10 minutes.      Vasopneumatic   Number Minutes Vasopneumatic  20 minutes    Vasopnuematic Location  --   Left shoulder with pillow between thorax and elbow.   Vasopneumatic Pressure Low      Manual Therapy   Manual Therapy Soft tissue mobilization    Soft tissue mobilization STW/M x 10 minutes to patient's left middle deltoid.                          PT Long Term Goals - 01/08/22 1717       PT LONG TERM GOAL #1   Title Patient will be independent with her HEP.    Baseline has not been able to do her HEP as much since she returned to work    Time 4    Period Weeks    Status On-going    Target Date 11/22/21      PT LONG TERM GOAL #2  Title Patient will be able to demonstrate at least 120 degrees of active left shoulder flexion.    Time 4    Period Weeks    Status Achieved    Target Date 11/22/21      PT LONG TERM GOAL #3   Title Patient will be able to carry at least 8 pounds with her left upper extremity without being limited by her left shoulder for improved function carrying her laundry.    Time 4    Period Weeks    Status On-going    Target Date 11/22/21      PT LONG TERM GOAL #4   Title Patient will be able to demonstrate at least 120 degrees of active left shoulder abduction for improved function reaching overhead.    Time 4    Period Weeks    Status Achieved    Target Date 11/22/21                   Plan - 01/31/22 1752     Clinical Impression Statement The patient presented to the clinic with left lateral shoulder discomfort like she experienced after lifting a heavy of kitty litter a couple of weeks ago.  Performed conservative treatment including soft tissue work to her middle deltoid.  He had no pain after treatment.  She continues to be compliant with her HEP.    Personal Factors and Comorbidities Other     Examination-Activity Limitations Reach Overhead;Carry;Lift    Examination-Participation Restrictions Cleaning;Other;Occupation;Community Activity;Laundry    Stability/Clinical Decision Making Evolving/Moderate complexity    Rehab Potential Good    PT Frequency 3x / week    PT Duration 4 weeks    PT Treatment/Interventions Electrical Stimulation;Cryotherapy;Iontophoresis '4mg'$ /ml Dexamethasone;Moist Heat;Neuromuscular re-education;Therapeutic exercise;Therapeutic activities;Patient/family education;Manual techniques;Passive range of motion;Taping;Vasopneumatic Device    PT Next Visit Plan Progress per protocol.    Consulted and Agree with Plan of Care Patient             Patient will benefit from skilled therapeutic intervention in order to improve the following deficits and impairments:  Decreased range of motion, Impaired UE functional use, Decreased activity tolerance, Pain, Hypomobility, Decreased strength  Visit Diagnosis: Stiffness of left shoulder, not elsewhere classified  Chronic left shoulder pain     Problem List Patient Active Problem List   Diagnosis Date Noted   Low hemoglobin 11/15/2020   Controlled substance agreement signed 02/04/2019   Benzodiazepine dependence ([place]) 02/04/2019   Gastroesophageal reflux disease 02/04/2019   Vitamin D deficiency 11/13/2017   Hypothyroidism 11/13/2017   Hyperlipemia 11/13/2017   Insomnia 11/10/2017   History of OCD (obsessive compulsive disorder) 01/12/2014   GAD (generalized anxiety disorder) 01/12/2014   Inguinal lymphadenopathy 05/26/2012   Rationale for Evaluation and Treatment Rehabilitation.  Katherine Hays, [place], PT 01/31/2022, 5:55 PM  [facility] [address] [place], [place], [address] Phone: [phone]   Fax:  [phone]  Name: Katherine Hays MRN: [id number] Date of Birth: 02/02/1971

## 2022-02-07 ENCOUNTER — Ambulatory Visit: Payer: No Typology Code available for payment source

## 2022-02-07 DIAGNOSIS — M25612 Stiffness of left shoulder, not elsewhere classified: Secondary | ICD-10-CM

## 2022-02-07 DIAGNOSIS — G8929 Other chronic pain: Secondary | ICD-10-CM

## 2022-02-07 NOTE — Therapy (Signed)
[facility] [address] [place], [place], [address] Phone: [phone]   Fax:  [phone]  Physical Therapy Treatment  Patient Details  Name: Katherine Hays MRN: [id number] Date of Birth: 04-19-1971 Referring Provider (PT): Durwin Nora, [place]   Encounter Date: 02/07/2022   PT End of Session - 02/07/22 1604     Visit Number 30    Number of Visits 30    Date for PT Re-Evaluation 02/15/22    PT Start Time 1600    PT Stop Time 1640    PT Time Calculation (min) 40 min    Activity Tolerance Patient tolerated treatment well    Behavior During Therapy [facility] for tasks assessed/performed             Past Medical History:  Diagnosis Date   Anxiety    Asthma    OCD (obsessive compulsive disorder)     Past Surgical History:  Procedure Laterality Date   TONSILLECTOMY     TUBAL LIGATION     WISDOM TOOTH EXTRACTION      There were no vitals filed for this visit.   Subjective Assessment - 02/07/22 1602     Subjective Patient reports that her shoulder is hurting a little today.    Pertinent History Hypothyroidism    Limitations House hold activities;Lifting    Patient Stated Goals reduced pain, carry laundry    Currently in Pain? Yes    Pain Score 1     Pain Location Shoulder    Pain Orientation Left    Pain Descriptors / Indicators Sore    Pain Onset More than a month ago                [facility] PT Assessment - 02/07/22 0001       Observation/Other Assessments   Focus on Therapeutic Outcomes (FOTO)  62.58      AROM   Left Shoulder Flexion 143 Degrees    Left Shoulder ABduction 143 Degrees    Left Shoulder Internal Rotation --   to L5; painful   Left Shoulder External Rotation --   to T3                          [facility] Adult PT Treatment/Exercise - 02/07/22 0001       Shoulder Exercises: Sidelying   External Rotation AROM;Left;20 reps    External Rotation Weight (lbs) 1      Shoulder Exercises:  Standing   External Rotation Strengthening;Left;20 reps;Theraband    Theraband Level (Shoulder External Rotation) Level 1 (Yellow)      Shoulder Exercises: Pulleys   Flexion 5 minutes      Shoulder Exercises: ROM/Strengthening   UBE (Upper Arm Bike) 120 RPM x 6 minutes   forward and backward   Ranger standing flexion   2 minutes     Shoulder Exercises: Stretch   Internal Rotation Stretch Other (comment)   15 reps; 5 second hold     Modalities   Modalities Vasopneumatic      Vasopneumatic   Number Minutes Vasopneumatic  10 minutes    Vasopnuematic Location  Shoulder    Vasopneumatic Pressure Low    Vasopneumatic Temperature  34                          PT Long Term Goals - 02/07/22 1636       PT LONG TERM GOAL #1  Title Patient will be independent with her HEP.    Time 4    Period Weeks    Status Achieved    Target Date 11/22/21      PT LONG TERM GOAL #2   Title Patient will be able to demonstrate at least 120 degrees of active left shoulder flexion.    Time 4    Period Weeks    Status Achieved    Target Date 11/22/21      PT LONG TERM GOAL #3   Title Patient will be able to carry at least 8 pounds with her left upper extremity without being limited by her left shoulder for improved function carrying her laundry.    Time 4    Period Weeks    Status Achieved    Target Date 11/22/21      PT LONG TERM GOAL #4   Title Patient will be able to demonstrate at least 120 degrees of active left shoulder abduction for improved function reaching overhead.    Time 4    Period Weeks    Status Achieved    Target Date 11/22/21                   Plan - 02/07/22 1604     Clinical Impression Statement Patient has made good progress with skilled physical therapy as evidenced by her objective measures and progress toward her goals. Her HEP was updated to include a functional internal rotatoin for improved function reaching behind her back. She reported  that her shoulder felt alright upon the the conclusion of treatment. She reported feeling comfortable with her HEP and was discharged at this time.    Personal Factors and Comorbidities Other    Examination-Activity Limitations Reach Overhead;Carry;Lift    Examination-Participation Restrictions Cleaning;Other;Occupation;Community Activity;Laundry    Stability/Clinical Decision Making Evolving/Moderate complexity    Rehab Potential Good    PT Frequency 3x / week    PT Duration 4 weeks    PT Treatment/Interventions Electrical Stimulation;Cryotherapy;Iontophoresis 4mg /ml Dexamethasone;Moist Heat;Neuromuscular re-education;Therapeutic exercise;Therapeutic activities;Patient/family education;Manual techniques;Passive range of motion;Taping;Vasopneumatic Device    PT Next Visit Plan Progress per protocol.    Consulted and Agree with Plan of Care Patient             Patient will benefit from skilled therapeutic intervention in order to improve the following deficits and impairments:  Decreased range of motion, Impaired UE functional use, Decreased activity tolerance, Pain, Hypomobility, Decreased strength  Visit Diagnosis: Stiffness of left shoulder, not elsewhere classified  Chronic left shoulder pain     Problem List Patient Active Problem List   Diagnosis Date Noted   Low hemoglobin 11/15/2020   Controlled substance agreement signed 02/04/2019   Benzodiazepine dependence ([place]) 02/04/2019   Gastroesophageal reflux disease 02/04/2019   Vitamin D deficiency 11/13/2017   Hypothyroidism 11/13/2017   Hyperlipemia 11/13/2017   Insomnia 11/10/2017   History of OCD (obsessive compulsive disorder) 01/12/2014   GAD (generalized anxiety disorder) 01/12/2014   Inguinal lymphadenopathy 05/26/2012   Rationale for Evaluation and Treatment Rehabilitation   Darlin Coco, PT 02/07/2022, 5:03 PM  [facility] [address] [place], [place],  [address] Phone: [phone]   Fax:  [phone]  Name: Katherine Hays MRN: [id number] Date of Birth: Jan 30, 1971  PHYSICAL THERAPY DISCHARGE SUMMARY  Visits from Start of Care: 30  Current functional level related to goals / functional outcomes: Patient was able to meet all of her goals for physical therapy at this  time.    Remaining deficits: Pain   Education / Equipment: HEP    Patient agrees to discharge. Patient goals were met. Patient is being discharged due to meeting the stated rehab goals.

## 2022-03-06 ENCOUNTER — Other Ambulatory Visit: Payer: Self-pay

## 2022-03-06 ENCOUNTER — Other Ambulatory Visit (HOSPITAL_COMMUNITY): Payer: Self-pay

## 2022-03-06 MED ORDER — PANTOPRAZOLE SODIUM 20 MG PO TBEC
20.0000 mg | DELAYED_RELEASE_TABLET | Freq: Every day | ORAL | 0 refills | Status: DC
  Filled 2022-03-06: qty 90, 90d supply, fill #0

## 2022-03-12 ENCOUNTER — Other Ambulatory Visit (HOSPITAL_COMMUNITY): Payer: Self-pay

## 2022-03-13 ENCOUNTER — Other Ambulatory Visit (HOSPITAL_COMMUNITY): Payer: Self-pay

## 2022-03-20 ENCOUNTER — Other Ambulatory Visit (HOSPITAL_COMMUNITY): Payer: Self-pay

## 2022-03-26 ENCOUNTER — Other Ambulatory Visit (HOSPITAL_COMMUNITY): Payer: Self-pay

## 2022-03-27 ENCOUNTER — Other Ambulatory Visit (HOSPITAL_COMMUNITY): Payer: Self-pay

## 2022-04-10 ENCOUNTER — Other Ambulatory Visit: Payer: Self-pay | Admitting: Family Medicine

## 2022-04-10 DIAGNOSIS — E039 Hypothyroidism, unspecified: Secondary | ICD-10-CM

## 2022-04-10 MED ORDER — LEVOTHYROXINE SODIUM 50 MCG PO TABS: 50.0000 ug | ORAL_TABLET | Freq: Every day | ORAL | 0 refills | Status: DC

## 2022-04-11 IMAGING — MR MR SHOULDER*L* W/O CM
5 series · 40 of 40 positions shown · non-contrast
Comparison: None.

CLINICAL DATA: Left shoulder pain.

EXAM:
MRI OF THE LEFT SHOULDER WITHOUT CONTRAST
TECHNIQUE: Multiplanar, multisequence MR imaging of the shoulder was performed.
No intravenous contrast was administered.

[Series 12: T2 fat-sat · axial · left · 4.0mm · 0.62mm/px · z∈[-86,+45]mm · 9 of 31 slices shown (1 of 3)]
[im 1/31]
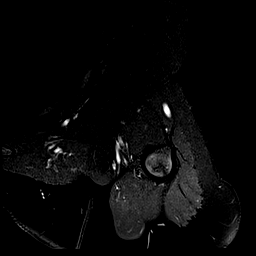
[im 4/31]
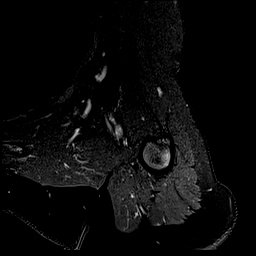
[im 8/31]
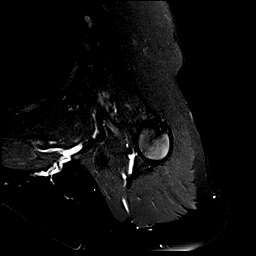
[im 12/31]
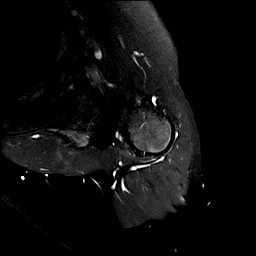
[im 16/31]
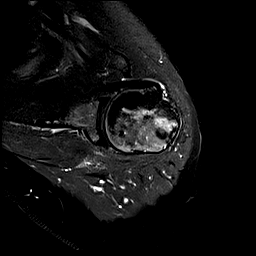
[im 19/31]
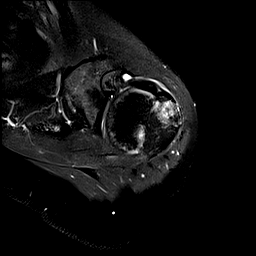
[im 23/31]
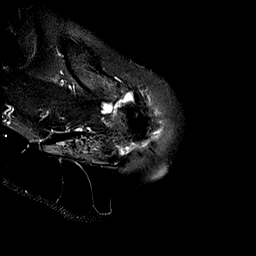
[im 27/31]
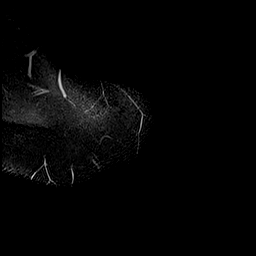
[im 31/31]
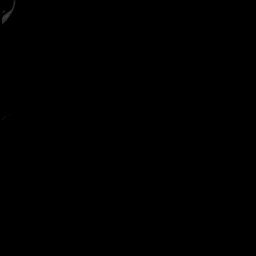

[Series 13: T2 fat-sat · oblique · left · 4.0mm · 0.50mm/px · 8 of 27 slices shown (2 of 3)]
[im 1/27]
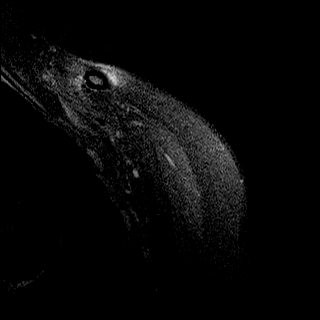
[im 4/27]
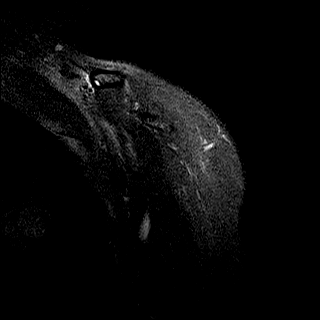
[im 8/27]
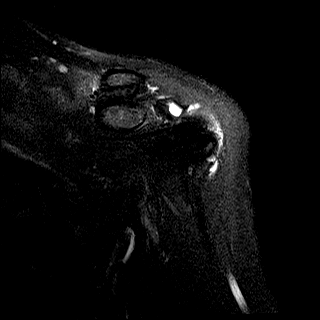
[im 12/27]
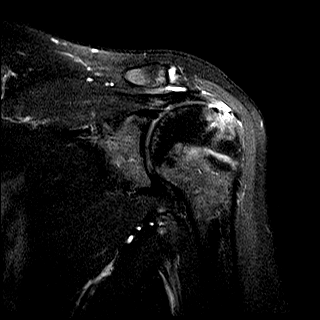
[im 15/27]
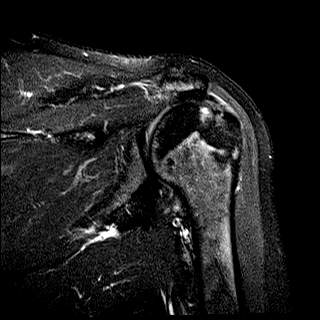
[im 19/27]
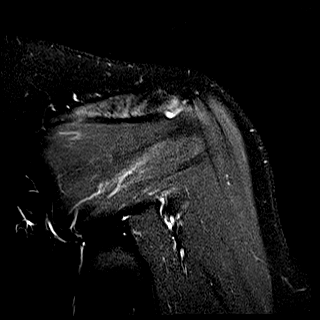
[im 23/27]
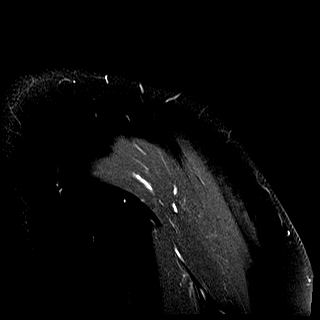
[im 27/27]
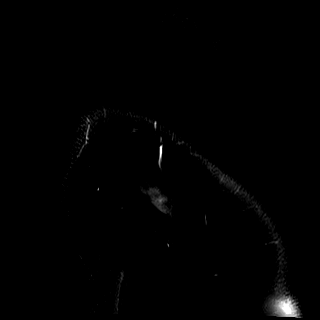

[Series 15: PD · oblique · left · 4.0mm · 0.50mm/px · 7 of 27 slices shown]
[im 1/27]
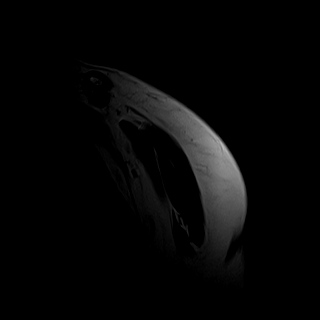
[im 5/27]
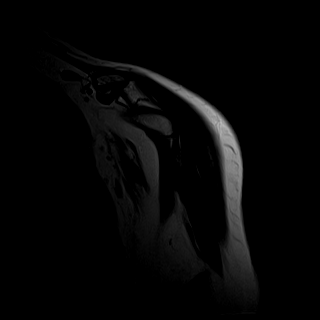
[im 9/27]
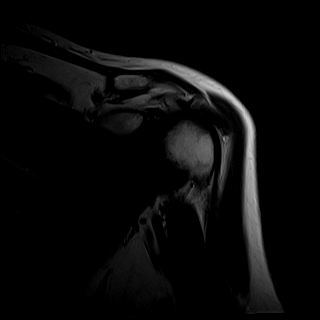
[im 14/27]
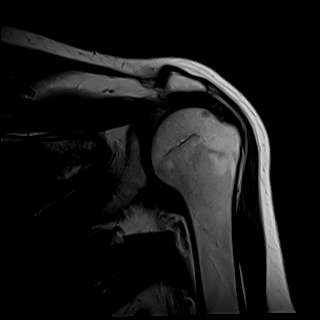
[im 18/27]
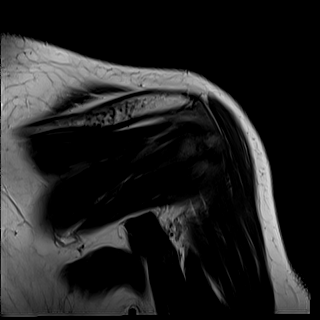
[im 22/27]
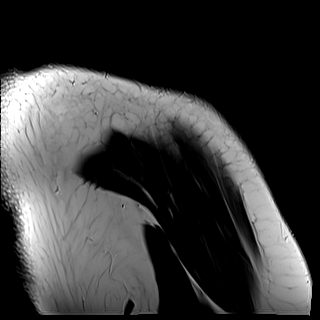
[im 27/27]
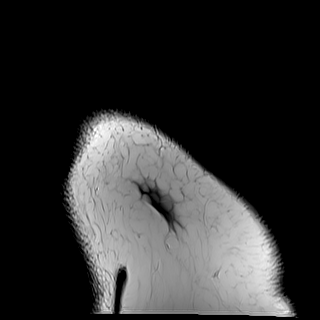

[Series 16: T1 · oblique · left · 3.0mm · 0.59mm/px · 8 of 31 slices shown]
[im 1/31]
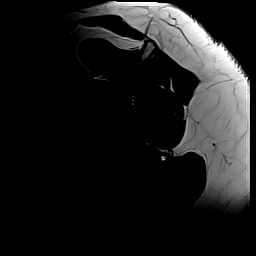
[im 5/31]
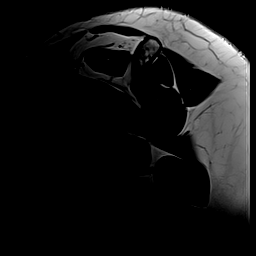
[im 9/31]
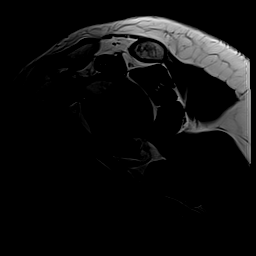
[im 13/31]
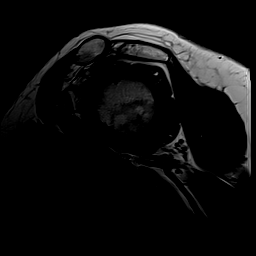
[im 18/31]
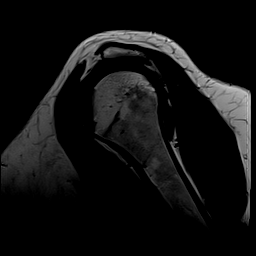
[im 22/31]
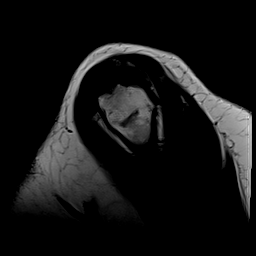
[im 26/31]
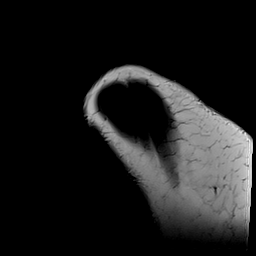
[im 31/31]
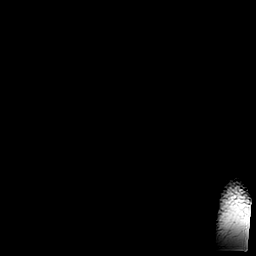

[Series 17: T2 fat-sat · oblique · left · 3.0mm · 0.59mm/px · 8 of 31 slices shown (3 of 3)]
[im 1/31]
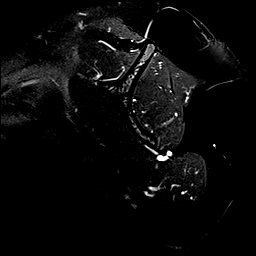
[im 5/31]
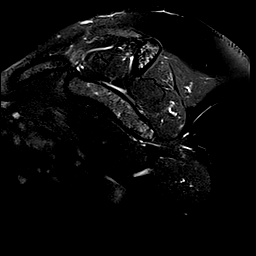
[im 9/31]
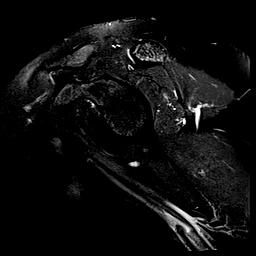
[im 13/31]
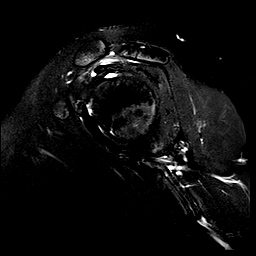
[im 18/31]
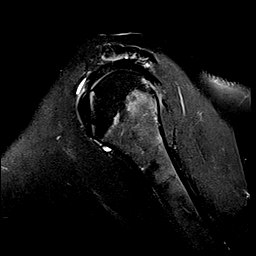
[im 22/31]
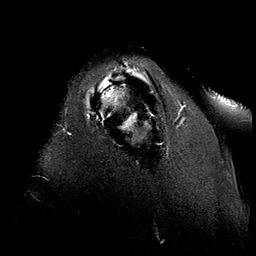
[im 26/31]
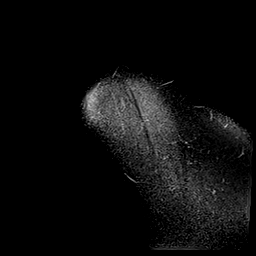
[im 31/31]
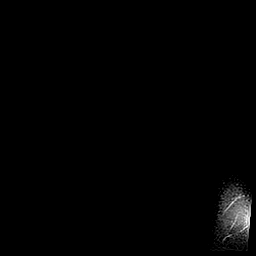

[40 of 40 positions shown; findings below may reference images not displayed]

FINDINGS: Rotator cuff: Moderate tendinosis of the supraspinatus tendon with a
10 mm full-thickness tear without significant retraction and with
subcortical reactive bone marrow edema. Infraspinatus tendon is
intact. Teres minor tendon is intact. Subscapularis tendon is
intact.

Muscles: No muscle atrophy or edema. No intramuscular fluid
collection or hematoma.

Biceps Long Head: Intraarticular and extraarticular portions of the
biceps tendon are intact.

Acromioclavicular Joint: No significant arthropathy of the
acromioclavicular joint. Small subacromial/subdeltoid bursal fluid.

Glenohumeral Joint: No joint effusion. No chondral defect.

Labrum: Grossly intact, but evaluation is limited by lack of
intraarticular fluid/contrast.

Bones: No fracture or dislocation. No aggressive osseous lesion.
Mild subcortical reactive marrow changes at the infraspinatus
insertion.

Other: No fluid collection or hematoma.
IMPRESSION: 1. Moderate tendinosis of the supraspinatus tendon with a 10 mm
full-thickness tear without significant retraction and with
subcortical reactive bone marrow edema.

## 2022-04-15 ENCOUNTER — Other Ambulatory Visit: Payer: Self-pay | Admitting: Family Medicine

## 2022-04-15 DIAGNOSIS — Z0000 Encounter for general adult medical examination without abnormal findings: Secondary | ICD-10-CM

## 2022-04-16 ENCOUNTER — Ambulatory Visit (INDEPENDENT_AMBULATORY_CARE_PROVIDER_SITE_OTHER): Payer: No Typology Code available for payment source | Admitting: Family

## 2022-04-16 ENCOUNTER — Other Ambulatory Visit (HOSPITAL_COMMUNITY): Payer: Self-pay

## 2022-04-16 ENCOUNTER — Encounter: Payer: Self-pay | Admitting: Family

## 2022-04-16 VITALS — BP 105/58 | HR 78 | Temp 97.3°F | Ht 67.0 in | Wt 169.0 lb

## 2022-04-16 DIAGNOSIS — Z79899 Other long term (current) drug therapy: Secondary | ICD-10-CM

## 2022-04-16 DIAGNOSIS — F1320 Sedative, hypnotic or anxiolytic dependence, uncomplicated: Secondary | ICD-10-CM | POA: Diagnosis not present

## 2022-04-16 DIAGNOSIS — E039 Hypothyroidism, unspecified: Secondary | ICD-10-CM

## 2022-04-16 DIAGNOSIS — F411 Generalized anxiety disorder: Secondary | ICD-10-CM

## 2022-04-16 DIAGNOSIS — Z0001 Encounter for general adult medical examination with abnormal findings: Secondary | ICD-10-CM | POA: Diagnosis not present

## 2022-04-16 DIAGNOSIS — K219 Gastro-esophageal reflux disease without esophagitis: Secondary | ICD-10-CM | POA: Diagnosis not present

## 2022-04-16 DIAGNOSIS — Z0000 Encounter for general adult medical examination without abnormal findings: Secondary | ICD-10-CM

## 2022-04-16 DIAGNOSIS — G4700 Insomnia, unspecified: Secondary | ICD-10-CM

## 2022-04-16 DIAGNOSIS — E559 Vitamin D deficiency, unspecified: Secondary | ICD-10-CM

## 2022-04-16 DIAGNOSIS — E785 Hyperlipidemia, unspecified: Secondary | ICD-10-CM

## 2022-04-16 MED ORDER — ALPRAZOLAM 0.5 MG PO TABS
ORAL_TABLET | Freq: Two times a day (BID) | ORAL | 5 refills | Status: DC
  Filled 2022-04-16: qty 60, 30d supply, fill #0
  Filled 2022-07-01: qty 60, 30d supply, fill #1
  Filled 2022-09-24: qty 60, 30d supply, fill #2

## 2022-04-16 MED ORDER — OMEPRAZOLE 20 MG PO CPDR
20.0000 mg | DELAYED_RELEASE_CAPSULE | Freq: Every day | ORAL | 4 refills | Status: DC
  Filled 2022-04-16: qty 90, 90d supply, fill #0
  Filled 2022-09-24: qty 90, 90d supply, fill #1

## 2022-04-16 MED ORDER — LEVOTHYROXINE SODIUM 50 MCG PO TABS
50.0000 ug | ORAL_TABLET | Freq: Every day | ORAL | 3 refills | Status: DC
  Filled 2022-04-16 – 2022-07-01 (×2): qty 90, 90d supply, fill #0
  Filled 2022-09-24: qty 90, 90d supply, fill #1
  Filled 2023-01-03: qty 90, 90d supply, fill #2

## 2022-04-16 NOTE — Patient Instructions (Signed)

## 2022-04-16 NOTE — Progress Notes (Signed)
Subjective:    Patient ID: Katherine Hays, female    DOB: 06-Jun-1971, 51 y.o.   MRN: [id number]  Chief Complaint  Patient presents with   Annual Exam    Numbness in groin   Irritable Bowel Syndrome   Pt presents to the office today for CPE without pap.  Gastroesophageal Reflux She complains of belching and heartburn. She reports no hoarse voice. This is a chronic problem. The current episode started more than 1 year ago. The problem occurs occasionally. The symptoms are aggravated by certain foods. Pertinent negatives include no fatigue. She has tried an antacid for the symptoms. The treatment provided mild relief.  Thyroid Problem Presents for follow-up visit. Symptoms include anxiety, constipation, depressed mood and hair loss. Patient reports no cold intolerance, dry skin, fatigue or hoarse voice. The symptoms have been stable. Her past medical history is significant for hyperlipidemia.  Hyperlipidemia This is a chronic problem. The current episode started more than 1 year ago. The problem is uncontrolled. Current antihyperlipidemic treatment includes diet change. The current treatment provides moderate improvement of lipids. Risk factors for coronary artery disease include dyslipidemia, hypertension, a sedentary lifestyle and post-menopausal.  Insomnia Primary symptoms: difficulty falling asleep, frequent awakening.   The current episode started more than one year. The onset quality is gradual. The problem occurs intermittently.  Anxiety Presents for follow-up visit. Symptoms include depressed mood, excessive worry, insomnia, nervous/anxious behavior and restlessness. Patient reports no irritability. Symptoms occur occasionally. The severity of symptoms is moderate.        Review of Systems  Constitutional:  Negative for fatigue and irritability.  HENT:  Negative for hoarse voice.   Gastrointestinal:  Positive for constipation and heartburn.  Endocrine: Negative for cold  intolerance.  Psychiatric/Behavioral:  The patient is nervous/anxious and has insomnia.   All other systems reviewed and are negative.  Family History  Problem Relation Age of Onset   Liver disease Father        cirrhosis   Pancreatic cancer Maternal Uncle    Diabetes Maternal Grandfather    Stroke Maternal Grandfather    Breast cancer Paternal Grandmother    Cancer Paternal Grandmother        breast   Heart disease Paternal Grandfather    Stroke Paternal Grandfather    Hyperthyroidism Mother    Hypertension Brother    Crohn's disease Other        Nephew  . Social History   Socioeconomic History   Marital status: Married    Spouse name: Not on file   Number of children: 3   Years of education: Not on file   Highest education level: Not on file  Occupational History   Occupation: Forensic scientist:   Tobacco Use   Smoking status: Never   Smokeless tobacco: Never  Vaping Use   Vaping Use: Never used  Substance and Sexual Activity   Alcohol use: No   Drug use: No   Sexual activity: Yes  Other Topics Concern   Not on file  Social History Narrative   Not on file   Social Determinants of Health   Financial Resource Strain: Not on file  Food Insecurity: Not on file  Transportation Needs: Not on file  Physical Activity: Not on file  Stress: Not on file  Social Connections: Not on file       Objective:   Physical Exam Vitals reviewed.  Constitutional:      General: She is not  in acute distress.    Appearance: She is well-developed.  HENT:     Head: Normocephalic and atraumatic.     Right Ear: Tympanic membrane normal.     Left Ear: Tympanic membrane normal.  Eyes:     Pupils: Pupils are equal, round, and reactive to light.  Neck:     Thyroid: No thyromegaly.  Cardiovascular:     Rate and Rhythm: Normal rate and regular rhythm.     Heart sounds: Normal heart sounds. No murmur heard. Pulmonary:     Effort: Pulmonary effort is normal.  No respiratory distress.     Breath sounds: Normal breath sounds. No wheezing.  Abdominal:     General: Bowel sounds are normal. There is no distension.     Palpations: Abdomen is soft.     Tenderness: There is no abdominal tenderness.  Musculoskeletal:        General: No tenderness. Normal range of motion.     Cervical back: Normal range of motion and neck supple.  Skin:    General: Skin is warm and dry.  Neurological:     Mental Status: She is alert and oriented to person, place, and time.     Cranial Nerves: No cranial nerve deficit.     Deep Tendon Reflexes: Reflexes are normal and symmetric.  Psychiatric:        Behavior: Behavior normal.        Thought Content: Thought content normal.        Judgment: Judgment normal.     BP (!) 105/58   Pulse 78   Temp (!) 97.3 F (36.3 C) (Temporal)   Ht $R'5\' 7"'Ri$  (1.702 m)   Wt 169 lb (76.7 kg)   SpO2 100%   BMI 26.47 kg/m       Assessment & Plan:   Katherine Hays comes in today with chief complaint of Annual Exam (Numbness in groin) and Irritable Bowel Syndrome   Diagnosis and orders addressed:  1. Annual physical exam - Vitamin B12 - VITAMIN D 25 Hydroxy (Vit-D Deficiency, Fractures) - TSH - Lipid panel - CMP14+EGFR - CBC with Differential/Platelet  2. Benzodiazepine dependence ([facility]) - ALPRAZolam (XANAX) 0.5 MG tablet; TAKE 1 TABLET BY MOUTH 2 TIMES DAILY  Dispense: 60 tablet; Refill: 5  3. Controlled substance agreement signed - ALPRAZolam (XANAX) 0.5 MG tablet; TAKE 1 TABLET BY MOUTH 2 TIMES DAILY  Dispense: 60 tablet; Refill: 5  4. GAD (generalized anxiety disorder) - ALPRAZolam (XANAX) 0.5 MG tablet; TAKE 1 TABLET BY MOUTH 2 TIMES DAILY  Dispense: 60 tablet; Refill: 5  5. Hypothyroidism, unspecified type - levothyroxine (SYNTHROID) 50 MCG tablet; TAKE 1 TABLET BY MOUTH DAILY BEFORE BREAKFAST.  Dispense: 90 tablet; Refill: 3  6. Gastroesophageal reflux disease, unspecified whether esophagitis present  -  omeprazole (PRILOSEC) 20 MG capsule; Take 1 capsule (20 mg total) by mouth daily.  Dispense: 90 capsule; Refill: 4  7. Insomnia, unspecified type  8. Vitamin D deficiency  9. Hyperlipidemia, unspecified hyperlipidemia type   Labs pending Health Maintenance reviewed Diet and exercise encouraged  Follow up plan: 6 months    Evelina Dun, FNP

## 2022-04-17 LAB — CMP14+EGFR
ALT: 23 IU/L (ref 0–32)
AST: 26 IU/L (ref 0–40)
Albumin/Globulin Ratio: 1.8 (ref 1.2–2.2)
Albumin: 4.4 g/dL (ref 3.8–4.9)
Alkaline Phosphatase: 68 IU/L (ref 44–121)
BUN/Creatinine Ratio: 15 (ref 9–23)
BUN: 10 mg/dL (ref 6–24)
Bilirubin Total: 0.3 mg/dL (ref 0.0–1.2)
CO2: 24 mmol/L (ref 20–29)
Calcium: 9.1 mg/dL (ref 8.7–10.2)
Chloride: 101 mmol/L (ref 96–106)
Creatinine, Ser: 0.67 mg/dL (ref 0.57–1.00)
Globulin, Total: 2.4 g/dL (ref 1.5–4.5)
Glucose: 94 mg/dL (ref 70–99)
Potassium: 4.9 mmol/L (ref 3.5–5.2)
Sodium: 139 mmol/L (ref 134–144)
Total Protein: 6.8 g/dL (ref 6.0–8.5)
eGFR: 104 mL/min/{1.73_m2} (ref >59)

## 2022-04-17 LAB — CBC WITH DIFFERENTIAL/PLATELET
Basophils Absolute: 0 10*3/uL (ref 0.0–0.2)
Basos: 1 %
EOS (ABSOLUTE): 0.2 10*3/uL (ref 0.0–0.4)
Eos: 3 %
Hematocrit: 40.7 % (ref 34.0–46.6)
Hemoglobin: 13.4 g/dL (ref 11.1–15.9)
Immature Grans (Abs): 0 10*3/uL (ref 0.0–0.1)
Immature Granulocytes: 0 %
Lymphocytes Absolute: 1.8 10*3/uL (ref 0.7–3.1)
Lymphs: 30 %
MCH: 29.5 pg (ref 26.6–33.0)
MCHC: 32.9 g/dL (ref 31.5–35.7)
MCV: 90 fL (ref 79–97)
Monocytes Absolute: 0.5 10*3/uL (ref 0.1–0.9)
Monocytes: 8 %
Neutrophils Absolute: 3.5 10*3/uL (ref 1.4–7.0)
Neutrophils: 58 %
Platelets: 327 10*3/uL (ref 150–450)
RBC: 4.55 x10E6/uL (ref 3.77–5.28)
RDW: 12.3 % (ref 11.7–15.4)
WBC: 6.1 10*3/uL (ref 3.4–10.8)

## 2022-04-17 LAB — LIPID PANEL
Chol/HDL Ratio: 5.2 ratio — ABNORMAL HIGH (ref 0.0–4.4)
Cholesterol, Total: 202 mg/dL — ABNORMAL HIGH (ref 100–199)
HDL: 39 mg/dL — ABNORMAL LOW (ref >39)
LDL Chol Calc (NIH): 135 mg/dL — ABNORMAL HIGH (ref 0–99)
Triglycerides: 156 mg/dL — ABNORMAL HIGH (ref 0–149)
VLDL Cholesterol Cal: 28 mg/dL (ref 5–40)

## 2022-04-17 LAB — VITAMIN B12: Vitamin B-12: 381 pg/mL (ref 232–1245)

## 2022-04-17 LAB — TSH: TSH: 1.8 u[IU]/mL (ref 0.450–4.500)

## 2022-04-17 LAB — VITAMIN D 25 HYDROXY (VIT D DEFICIENCY, FRACTURES): Vit D, 25-Hydroxy: 32.2 ng/mL (ref 30.0–100.0)

## 2022-05-10 ENCOUNTER — Encounter: Payer: Self-pay | Admitting: Family

## 2022-05-10 ENCOUNTER — Ambulatory Visit (INDEPENDENT_AMBULATORY_CARE_PROVIDER_SITE_OTHER): Payer: No Typology Code available for payment source | Admitting: Family

## 2022-05-10 DIAGNOSIS — B3731 Acute candidiasis of vulva and vagina: Secondary | ICD-10-CM | POA: Diagnosis not present

## 2022-05-10 MED ORDER — FLUCONAZOLE 150 MG PO TABS: 150.0000 mg | ORAL_TABLET | ORAL | 0 refills | Status: DC | PRN

## 2022-05-10 NOTE — Progress Notes (Signed)
Virtual Visit  Note Due to COVID-19 pandemic this visit was conducted virtually. This visit type was conducted due to national recommendations for restrictions regarding the COVID-19 Pandemic (e.g. social distancing, sheltering in place) in an effort to limit this patient's exposure and mitigate transmission in our community. All issues noted in this document were discussed and addressed.  A physical exam was not performed with this format.  I connected with Katherine Hays on 05/10/22 at 9:52 AM by telephone and verified that I am speaking with the correct person using two identifiers. Katherine Hays is currently located at work and no one is currently with her during visit. The provider, Evelina Dun, FNP is located in their office at time of visit.  I discussed the limitations, risks, security and privacy concerns of performing an evaluation and management service by telephone and the availability of in person appointments. I also discussed with the patient that there may be a patient responsible charge related to this service. The patient expressed understanding and agreed to proceed.  Katherine Hays, Katherine Hays are scheduled for a virtual visit with your provider today.    Just as we do with appointments in the office, we must obtain your consent to participate.  Your consent will be active for this visit and any virtual visit you may have with one of our providers in the next 365 days.    If you have a MyChart account, I can also send a copy of this consent to you electronically.  All virtual visits are billed to your insurance company just like a traditional visit in the office.  As this is a virtual visit, video technology does not allow for your provider to perform a traditional examination.  This may limit your provider's ability to fully assess your condition.  If your provider identifies any concerns that need to be evaluated in person or the need to arrange testing such as labs, EKG, etc,  we will make arrangements to do so.    Although advances in technology are sophisticated, we cannot ensure that it will always work on either your end or our end.  If the connection with a video visit is poor, we may have to switch to a telephone visit.  With either a video or telephone visit, we are not always able to ensure that we have a secure connection.   I need to obtain your verbal consent now.   Are you willing to proceed with your visit today?   Katherine Hays has provided verbal consent on 05/10/2022 for a virtual visit (video or telephone).   Evelina Dun, [place] 05/10/2022  9:55 AM    History and Present Illness:  Vaginal Itching The patient's primary symptoms include genital itching. The patient's pertinent negatives include no vaginal bleeding or vaginal discharge. This is a new problem. The current episode started in the past 7 days. The problem occurs intermittently. The problem has been waxing and waning. The pain is mild. Pertinent negatives include no joint swelling or rash. Nothing aggravates the symptoms. She has tried nothing for the symptoms. The treatment provided no relief.      Review of Systems  Genitourinary:  Negative for vaginal discharge.  Skin:  Negative for rash.     Observations/Objective: No SOB or distress noted   Assessment and Plan: 1. Vagina, candidiasis Keep clean and dry Avoid scratching  Probiotic as needed Follow up if symptoms worsen or do not improve  - fluconazole (DIFLUCAN) 150 MG tablet; Take  1 tablet (150 mg total) by mouth every three (3) days as needed.  Dispense: 3 tablet; Refill: 0     I discussed the assessment and treatment plan with the patient. The patient was provided an opportunity to ask questions and all were answered. The patient agreed with the plan and demonstrated an understanding of the instructions.   The patient was advised to call back or seek an in-person evaluation if the symptoms worsen or if the  condition fails to improve as anticipated.  The above assessment and management plan was discussed with the patient. The patient verbalized understanding of and has agreed to the management plan. Patient is aware to call the clinic if symptoms persist or worsen. Patient is aware when to return to the clinic for a follow-up visit. Patient educated on when it is appropriate to go to the emergency department.   Time call ended:  10:03 AM   I provided 11 minutes of  non face-to-face time during this encounter.    Evelina Dun, FNP

## 2022-06-04 ENCOUNTER — Ambulatory Visit (INDEPENDENT_AMBULATORY_CARE_PROVIDER_SITE_OTHER): Payer: No Typology Code available for payment source | Admitting: Nurse Practitioner

## 2022-06-04 ENCOUNTER — Encounter: Payer: Self-pay | Admitting: Nurse Practitioner

## 2022-06-04 VITALS — BP 110/76 | HR 88 | Temp 99.5°F | Ht 67.0 in | Wt 163.0 lb

## 2022-06-04 DIAGNOSIS — J00 Acute nasopharyngitis [common cold]: Secondary | ICD-10-CM

## 2022-06-04 LAB — VERITOR FLU A/B WAIVED
Influenza A: NEGATIVE
Influenza B: NEGATIVE

## 2022-06-04 NOTE — Patient Instructions (Signed)
1. Take meds as prescribed 2. Use a cool mist humidifier especially during the winter months and when heat has been humid. 3. Use saline nose sprays frequently 4. Saline irrigations of the nose can be very helpful if done frequently.  * 4X daily for 1 week*  * Use of a nettie pot can be helpful with this. Follow directions with this* 5. Drink plenty of fluids 6. Keep thermostat turn down low 7.For any cough or congestion- OTC meds as needed 8. For fever or aces or pains- take tylenol or ibuprofen appropriate for age and weight.  * for fevers greater than 101 orally you may alternate ibuprofen and tylenol every  3 hours.

## 2022-06-04 NOTE — Addendum Note (Signed)
Addended by: Rolena Infante on: 06/04/2022 10:07 AM   Modules accepted: Orders

## 2022-06-04 NOTE — Progress Notes (Signed)
Subjective:    Patient ID: Katherine Hays, female    DOB: April 25, 1971, 51 y.o.   MRN: [id number]   Chief Complaint: URI symptoms  Patient come sin with URI symptoms. Her husband was sick over the weekend . They did a home covid test that they think was positive.   URI  This is a new problem. The current episode started yesterday. The problem has been gradually worsening. There has been no fever (99.5). Associated symptoms include congestion, coughing (slight), rhinorrhea, sinus pain and sneezing. Pertinent negatives include no headaches or sore throat. She has tried acetaminophen for the symptoms. The treatment provided mild relief.       Review of Systems  HENT:  Positive for congestion, rhinorrhea, sinus pain and sneezing. Negative for sore throat.   Respiratory:  Positive for cough (slight).   Neurological:  Negative for headaches.       Objective:   Physical Exam Vitals reviewed.  Constitutional:      Appearance: Normal appearance.  HENT:     Right Ear: Tympanic membrane normal.     Left Ear: Tympanic membrane normal.     Nose: Congestion and rhinorrhea present.     Mouth/Throat:     Pharynx: No oropharyngeal exudate or posterior oropharyngeal erythema.  Eyes:     Pupils: Pupils are equal, round, and reactive to light.  Cardiovascular:     Rate and Rhythm: Normal rate and regular rhythm.     Heart sounds: Normal heart sounds.  Pulmonary:     Effort: Pulmonary effort is normal.     Breath sounds: Normal breath sounds.  Musculoskeletal:     Cervical back: Normal range of motion and neck supple.  Neurological:     General: No focal deficit present.     Mental Status: She is alert and oriented to person, place, and time.  Psychiatric:        Mood and Affect: Mood normal.        Behavior: Behavior normal.    BP 110/76   Pulse 88   Temp 99.5 F (37.5 C) (Skin)   Ht '5\' 7"'$  (1.702 m)   Wt 163 lb (73.9 kg)   BMI 25.53 kg/m         Assessment & Plan:    Katherine Hays in today with chief complaint of URI   1. Acute nasopharyngitis 1. Take meds as prescribed 2. Use a cool mist humidifier especially during the winter months and when heat has been humid. 3. Use saline nose sprays frequently 4. Saline irrigations of the nose can be very helpful if done frequently.  * 4X daily for 1 week*  * Use of a nettie pot can be helpful with this. Follow directions with this* 5. Drink plenty of fluids 6. Keep thermostat turn down low 7.For any cough or congestion- OTC meds as needed 8. For fever or aces or pains- take tylenol or ibuprofen appropriate for age and weight.  * for fevers greater than 101 orally you may alternate ibuprofen and tylenol every  3 hours.    - SARS-CoV-2 Semi-Quantitative Total Antibody, Spike    The above assessment and management plan was discussed with the patient. The patient verbalized understanding of and has agreed to the management plan. Patient is aware to call the clinic if symptoms persist or worsen. Patient is aware when to return to the clinic for a follow-up visit. Patient educated on when it is appropriate to go to the emergency department.  Mary-Margaret Marisue Canion, FNP   

## 2022-06-04 NOTE — Addendum Note (Signed)
Addended by: Rolena Infante on: 06/04/2022 09:54 AM   Modules accepted: Orders

## 2022-06-05 LAB — NOVEL CORONAVIRUS, NAA: SARS-CoV-2, NAA: DETECTED — AB

## 2022-06-14 ENCOUNTER — Telehealth: Payer: Self-pay

## 2022-06-14 MED ORDER — BENZONATATE 100 MG PO CAPS: 100.0000 mg | ORAL_CAPSULE | Freq: Three times a day (TID) | ORAL | 0 refills | Status: DC | PRN

## 2022-06-14 NOTE — Telephone Encounter (Signed)
Patient is getting over Covid but still having problems with a cough. Wants to know if you can prescribe something for the cough? Please review and advise

## 2022-06-17 ENCOUNTER — Other Ambulatory Visit (HOSPITAL_COMMUNITY): Payer: Self-pay

## 2022-06-21 ENCOUNTER — Other Ambulatory Visit (HOSPITAL_COMMUNITY): Payer: Self-pay

## 2022-06-24 ENCOUNTER — Other Ambulatory Visit (HOSPITAL_COMMUNITY): Payer: Self-pay

## 2022-07-01 ENCOUNTER — Other Ambulatory Visit (HOSPITAL_COMMUNITY): Payer: Self-pay

## 2022-07-01 ENCOUNTER — Other Ambulatory Visit: Payer: Self-pay | Admitting: Family

## 2022-07-01 DIAGNOSIS — Z8659 Personal history of other mental and behavioral disorders: Secondary | ICD-10-CM

## 2022-07-01 DIAGNOSIS — G4700 Insomnia, unspecified: Secondary | ICD-10-CM

## 2022-07-01 DIAGNOSIS — F411 Generalized anxiety disorder: Secondary | ICD-10-CM

## 2022-07-01 MED ORDER — SERTRALINE HCL 100 MG PO TABS
100.0000 mg | ORAL_TABLET | Freq: Every day | ORAL | 0 refills | Status: DC
  Filled 2022-07-01: qty 90, 90d supply, fill #0

## 2022-08-22 ENCOUNTER — Ambulatory Visit (INDEPENDENT_AMBULATORY_CARE_PROVIDER_SITE_OTHER): Payer: 59 | Admitting: Family

## 2022-08-22 ENCOUNTER — Encounter: Payer: Self-pay | Admitting: Family

## 2022-08-22 VITALS — BP 92/53 | HR 71 | Temp 97.5°F | Ht 67.0 in | Wt 165.0 lb

## 2022-08-22 DIAGNOSIS — K580 Irritable bowel syndrome with diarrhea: Secondary | ICD-10-CM

## 2022-08-22 DIAGNOSIS — Z1211 Encounter for screening for malignant neoplasm of colon: Secondary | ICD-10-CM | POA: Diagnosis not present

## 2022-08-22 DIAGNOSIS — M79662 Pain in left lower leg: Secondary | ICD-10-CM | POA: Diagnosis not present

## 2022-08-22 NOTE — Progress Notes (Signed)
   Subjective:    Patient ID: Katherine Hays, female    DOB: 01-27-1971, 52 y.o.   MRN: [id number]  Pt presents to the office today with diarrhea. She reports she has IBS and this common.   She is also complaining of left calf pain that started this morning. Denies any redness, warmth. Denies any injury.  Diarrhea  This is a chronic problem. The problem occurs 5 to 10 times per day. The stool consistency is described as Watery. Pertinent negatives include no fever, headaches or vomiting. There are no known risk factors. She has tried anti-motility drug for the symptoms. The treatment provided mild relief.  Leg Pain       Review of Systems  Constitutional:  Negative for fever.  Gastrointestinal:  Positive for diarrhea. Negative for vomiting.  Neurological:  Negative for headaches.  All other systems reviewed and are negative.      Objective:   Physical Exam Vitals reviewed.  Constitutional:      General: She is not in acute distress.    Appearance: She is well-developed.  HENT:     Head: Normocephalic and atraumatic.     Right Ear: Tympanic membrane normal.     Left Ear: Tympanic membrane normal.  Eyes:     Pupils: Pupils are equal, round, and reactive to light.  Neck:     Thyroid: No thyromegaly.  Cardiovascular:     Rate and Rhythm: Normal rate and regular rhythm.     Heart sounds: Normal heart sounds. No murmur heard. Pulmonary:     Effort: Pulmonary effort is normal. No respiratory distress.     Breath sounds: Normal breath sounds. No wheezing.  Abdominal:     General: Bowel sounds are normal. There is no distension.     Palpations: Abdomen is soft.     Tenderness: There is no abdominal tenderness.  Musculoskeletal:        General: No tenderness. Normal range of motion.     Cervical back: Normal range of motion and neck supple.     Comments: Negative Homans sign, no redness, warmth, swelling, or pain on exam  Skin:    General: Skin is warm and dry.   Neurological:     Mental Status: She is alert and oriented to person, place, and time.     Cranial Nerves: No cranial nerve deficit.     Deep Tendon Reflexes: Reflexes are normal and symmetric.  Psychiatric:        Behavior: Behavior normal.        Thought Content: Thought content normal.        Judgment: Judgment normal.       BP (!) 92/53   Pulse 71   Temp (!) 97.5 F (36.4 C) (Temporal)   Ht '5\' 7"'$  (1.702 m)   Wt 165 lb (74.8 kg)   SpO2 100%   BMI 25.84 kg/m      Assessment & Plan:  Katherine Hays comes in today with chief complaint of Diarrhea and Leg Pain (Not hurting but feels funny in calf )   Diagnosis and orders addressed:  1. Irritable bowel syndrome with diarrhea Start fiber daily Start food diary Limit dairy foods Imodium as needed   2. Pain of left calf -I think this is strain, take NSAIDs If pain continues let me know and I will order doppler Red flags discussed   3. Colon cancer screening - Ambulatory referral to Gastroenterology   Evelina Dun, FNP

## 2022-08-22 NOTE — Patient Instructions (Signed)
Diet for Irritable Bowel Syndrome When you have irritable bowel syndrome (IBS), it is very important to follow the eating habits that are best for your condition. IBS may cause various symptoms, such as pain in the abdomen, constipation, or diarrhea. Choosing the right foods can help to ease the discomfort from these symptoms. Work with your health care provider and dietitian to find the eating plan that will help to control your symptoms. What are tips for following this plan?  Keep a food diary. This will help you identify foods that cause symptoms. Write down: What you eat and when you eat it. What symptoms you have. When symptoms occur in relation to your meals, such as "pain in abdomen 2 hours after dinner." Eat your meals slowly and in a relaxed setting. Aim to eat 5-6 small meals per day. Do not skip meals. Drink enough fluid to keep your urine pale yellow. Ask your health care provider if you should take an over-the-counter probiotic to help restore healthy bacteria in your gut (digestive tract). Probiotics are foods that contain good bacteria and yeasts. Your dietitian may have specific dietary recommendations for you based on your symptoms. Your dietitian may recommend that you: Avoid foods that cause symptoms. Talk with your dietitian about other ways to get the same nutrients that are in those problem foods. Avoid foods with gluten. Gluten is a protein that is found in rye, wheat, and barley. Eat more foods that contain soluble fiber. Examples of foods with high soluble fiber include oats, seeds, and certain fruits and vegetables. Take a fiber supplement if told by your dietitian. Reduce or avoid certain foods called FODMAPs. These are foods that contain sugars that are hard for some people to digest. Ask your health care provider which foods to avoid. What foods should I avoid? The following are some foods and drinks that may make your symptoms worse: Fatty foods, such as french  fries. Foods that contain gluten, such as pasta and cereal. Dairy products, such as milk, cheese, and ice cream. Spicy foods. Alcohol. Products with caffeine, such as coffee, tea, or chocolate. Carbonated drinks, such as soda. Foods that are high in FODMAPs. These include certain fruits and vegetables. Products with sweeteners such as honey, high fructose corn syrup, sorbitol, and mannitol. The items listed above may not be a complete list of foods and beverages you should avoid. Contact a dietitian for more information. What foods are good sources of fiber? Your health care provider or dietitian may recommend that you eat more foods that contain fiber. Fiber can help to reduce constipation and other IBS symptoms. Add foods with fiber to your diet a little at a time so your body can get used to them. Too much fiber at one time might cause gas and swelling of your abdomen. The following are some foods that are good sources of fiber: Berries, such as raspberries, strawberries, and blueberries. Tomatoes. Carrots. Brown rice. Oats. Seeds, such as chia and pumpkin seeds. The items listed above may not be a complete list of recommended sources of fiber. Contact your dietitian for more options. Where to find more information International Foundation for Functional Gastrointestinal Disorders: aboutibs.org National Institute of Diabetes and Digestive and Kidney Diseases: niddk.nih.gov Summary When you have irritable bowel syndrome (IBS), it is very important to follow the eating habits that are best for your condition. IBS may cause various symptoms, such as pain in the abdomen, constipation, or diarrhea. Choosing the right foods can help to ease the   discomfort that comes from symptoms. Your health care provider or dietitian may recommend that you eat more foods that contain fiber. Keep a food diary. This will help you identify foods that cause symptoms. This information is not intended to replace  advice given to you by your health care provider. Make sure you discuss any questions you have with your health care provider. Document Revised: 07/17/2021 Document Reviewed: 07/17/2021 Elsevier Patient Education  2023 Elsevier Inc.  

## 2022-09-24 ENCOUNTER — Other Ambulatory Visit: Payer: Self-pay | Admitting: Family

## 2022-09-24 DIAGNOSIS — G4700 Insomnia, unspecified: Secondary | ICD-10-CM

## 2022-09-24 DIAGNOSIS — F411 Generalized anxiety disorder: Secondary | ICD-10-CM

## 2022-09-24 DIAGNOSIS — Z8659 Personal history of other mental and behavioral disorders: Secondary | ICD-10-CM

## 2022-09-25 ENCOUNTER — Other Ambulatory Visit: Payer: Self-pay

## 2022-09-25 ENCOUNTER — Other Ambulatory Visit (HOSPITAL_BASED_OUTPATIENT_CLINIC_OR_DEPARTMENT_OTHER): Payer: Self-pay

## 2022-09-25 MED ORDER — SERTRALINE HCL 100 MG PO TABS
100.0000 mg | ORAL_TABLET | Freq: Every day | ORAL | 1 refills | Status: DC
  Filled 2022-09-25: qty 90, 90d supply, fill #0
  Filled 2023-01-03: qty 90, 90d supply, fill #1

## 2022-10-03 ENCOUNTER — Encounter: Payer: Self-pay | Admitting: Nurse Practitioner

## 2022-11-04 ENCOUNTER — Other Ambulatory Visit (HOSPITAL_COMMUNITY): Payer: Self-pay

## 2022-11-04 ENCOUNTER — Other Ambulatory Visit (INDEPENDENT_AMBULATORY_CARE_PROVIDER_SITE_OTHER): Payer: 59

## 2022-11-04 ENCOUNTER — Encounter: Payer: Self-pay | Admitting: Nurse Practitioner

## 2022-11-04 ENCOUNTER — Ambulatory Visit: Payer: 59 | Admitting: Nurse Practitioner

## 2022-11-04 VITALS — BP 102/70 | HR 80 | Ht 67.0 in | Wt 159.4 lb

## 2022-11-04 DIAGNOSIS — K219 Gastro-esophageal reflux disease without esophagitis: Secondary | ICD-10-CM | POA: Diagnosis not present

## 2022-11-04 DIAGNOSIS — K529 Noninfective gastroenteritis and colitis, unspecified: Secondary | ICD-10-CM

## 2022-11-04 DIAGNOSIS — R1011 Right upper quadrant pain: Secondary | ICD-10-CM

## 2022-11-04 LAB — CBC WITH DIFFERENTIAL/PLATELET
Basophils Absolute: 0 10*3/uL (ref 0.0–0.1)
Basophils Relative: 0.9 % (ref 0.0–3.0)
Eosinophils Absolute: 0.3 10*3/uL (ref 0.0–0.7)
Eosinophils Relative: 5 % (ref 0.0–5.0)
HCT: 41.5 % (ref 36.0–46.0)
Hemoglobin: 13.8 g/dL (ref 12.0–15.0)
Lymphocytes Relative: 30.6 % (ref 12.0–46.0)
Lymphs Abs: 1.7 10*3/uL (ref 0.7–4.0)
MCHC: 33.2 g/dL (ref 30.0–36.0)
MCV: 88.9 fl (ref 78.0–100.0)
Monocytes Absolute: 0.5 10*3/uL (ref 0.1–1.0)
Monocytes Relative: 9.6 % (ref 3.0–12.0)
Neutro Abs: 2.9 10*3/uL (ref 1.4–7.7)
Neutrophils Relative %: 53.9 % (ref 43.0–77.0)
Platelets: 376 10*3/uL (ref 150.0–400.0)
RBC: 4.67 Mil/uL (ref 3.87–5.11)
RDW: 12.7 % (ref 11.5–15.5)
WBC: 5.5 10*3/uL (ref 4.0–10.5)

## 2022-11-04 LAB — COMPREHENSIVE METABOLIC PANEL
ALT: 11 U/L (ref 0–35)
AST: 14 U/L (ref 0–37)
Albumin: 4.7 g/dL (ref 3.5–5.2)
Alkaline Phosphatase: 70 U/L (ref 39–117)
BUN: 9 mg/dL (ref 6–23)
CO2: 28 mEq/L (ref 19–32)
Calcium: 10.2 mg/dL (ref 8.4–10.5)
Chloride: 103 mEq/L (ref 96–112)
Creatinine, Ser: 0.81 mg/dL (ref 0.40–1.20)
GFR: 82.76 mL/min (ref >60.00)
Glucose, Bld: 83 mg/dL (ref 70–99)
Potassium: 4.1 mEq/L (ref 3.5–5.1)
Sodium: 140 mEq/L (ref 135–145)
Total Bilirubin: 0.3 mg/dL (ref 0.2–1.2)
Total Protein: 7.6 g/dL (ref 6.0–8.3)

## 2022-11-04 LAB — C-REACTIVE PROTEIN: CRP: <1 mg/dL (ref 0.5–20.0)

## 2022-11-04 MED ORDER — DICYCLOMINE HCL 10 MG PO CAPS
10.0000 mg | ORAL_CAPSULE | Freq: Two times a day (BID) | ORAL | 0 refills | Status: DC
  Filled 2022-11-04 (×2): qty 60, 30d supply, fill #0

## 2022-11-04 MED ORDER — NA SULFATE-K SULFATE-MG SULF 17.5-3.13-1.6 GM/177ML PO SOLN
1.0000 | Freq: Once | ORAL | 0 refills | Status: AC
  Filled 2022-11-04 (×2): qty 354, 1d supply, fill #0

## 2022-11-04 MED ORDER — FAMOTIDINE 20 MG PO TABS
20.0000 mg | ORAL_TABLET | Freq: Two times a day (BID) | ORAL | 0 refills | Status: DC
  Filled 2022-11-04 (×2): qty 60, 30d supply, fill #0

## 2022-11-04 NOTE — Progress Notes (Signed)
+    11/04/2022 Katherine Hays [id number] [birth year]   CHIEF COMPLAINT: Chronic diarrhea, right upper abdominal pain  HISTORY OF PRESENT ILLNESS: Katherine Hays is a 52 year old female with past medical history of anxiety, OCD, asthma and sciatica. She presents to our office today as referred by Evelina Dun NP for further evaluation regarding chronic diarrhea and to schedule a colonoscopy.  She endorses passing 5-6 nonbloody diarrhea bowel movements daily for the past 10 years.  She stated her diarrhea started after she had Salmonella infection in 2014 and she subsequently underwent a colonoscopy 10/2912 by Dr. Ardis Hughs which was normal, no evidence of colitis.  Celiac serology was negative.  She infrequently takes Pepto-Bismol, Imodium or Tums to control her diarrhea.  No specific food triggers.  She previously drank 1 large glass of milk daily which she stopped 1 month ago without improvement. She eats cheese several days weekly and drinks 16 to 24 ounces of coffee with creamer daily.  No antibiotics within the past 6 months. She has frequent heartburn and burping for several years as well.  No difficulty swallowing solid foods.  If she drinks Coke, she feels it gets stuck in her midesophagus for few seconds before passing, otherwise no difficulty swallowing liquids.  She complains of having RUQ contraction-like pain which comes and goes for the past 10 years but has recently been more noticeable and last twice weekly for 5 minutes then goes away.  Her RUQ pain happens more frequently when she is sitting at work and is worse if she eats a larger meal.  She has early satiety.  No nausea or vomiting.  She previously took Motrin on a fairly consistent basis prior to her left shoulder surgery 10/2021.  She sometimes has central abdominal pain which occurs once or twice weekly and last for 1 hour which has also occurred for several years.  No severe abdominal pain.  No fevers.  Nephew with history of  Crohn's disease.  No known family history of colorectal cancer.      Latest Ref Rng & Units 04/16/2022    7:59 AM 04/20/2021   11:01 AM 11/14/2020   11:14 AM  CBC  WBC 3.4 - 10.8 x10E3/uL 6.1  9.3  8.5   Hemoglobin 11.1 - 15.9 g/dL 13.4  12.8  12.7   Hematocrit 34.0 - 46.6 % 40.7  40.0  39.6   Platelets 150 - 450 x10E3/uL 327  386  368        Latest Ref Rng & Units 04/16/2022    7:59 AM 04/20/2021   11:01 AM 03/15/2020    7:59 AM  CMP  Glucose 70 - 99 mg/dL 94  71  100   BUN 6 - 24 mg/dL 10  13  10    Creatinine 0.57 - 1.00 mg/dL 0.67  0.66  0.72   Sodium 134 - 144 mmol/L 139  140  138   Potassium 3.5 - 5.2 mmol/L 4.9  4.4  4.7   Chloride 96 - 106 mmol/L 101  103  101   CO2 20 - 29 mmol/L 24  25  22    Calcium 8.7 - 10.2 mg/dL 9.1  9.4  9.1   Total Protein 6.0 - 8.5 g/dL 6.8  6.6  6.9   Total Bilirubin 0.0 - 1.2 mg/dL 0.3  <0.2  0.3   Alkaline Phos 44 - 121 IU/L 68  88  82   AST 0 - 40 IU/L 26  17  18  ALT 0 - 32 IU/L 23  13  16      Colonoscopy 11/02/2012 by Dr. Ardis Hughs: Normal colonoscopy - POLYPOID COLORECTAL MUCOSA WITH INTRAMUCOSAL LYMPHOID AGGREGATES. NO ADENOMATOUS CHANGE OR MALIGNANCY IDENTIFIED.  Past Medical History:  Diagnosis Date   Anxiety    Asthma    OCD (obsessive compulsive disorder)    Past Surgical History:  Procedure Laterality Date   TONSILLECTOMY     TUBAL LIGATION     WISDOM TOOTH EXTRACTION    Left rotator cuff surgery 10/22/2021  Social History:  reports that she has never smoked. She has never used smokeless tobacco. She reports that she does not drink alcohol and does not use drugs.  Family History: family history includes Breast cancer in her paternal grandmother; Cancer in her paternal grandmother; Crohn's disease in an other family member; Diabetes in her maternal grandfather; Heart disease in her paternal grandfather; Hypertension in her brother; Hyperthyroidism in her mother; Liver disease in her father; Pancreatic cancer in her maternal uncle;  Stroke in her maternal grandfather and paternal grandfather. Nephew had Crohn's disease.   Allergies  Allergen Reactions   Codeine       Outpatient Encounter Medications as of 11/04/2022  Medication Sig   levonorgestrel (MIRENA) 20 MCG/24HR IUD 1 each by Intrauterine route once.   levothyroxine (SYNTHROID) 50 MCG tablet Take 1 tablet (50 mcg total) by mouth daily before breakfast.   omeprazole (PRILOSEC) 20 MG capsule Take 1 capsule by mouth daily. (Patient not taking: Reported on 08/22/2022)   sertraline (ZOLOFT) 100 MG tablet Take 1 tablet (100 mg total) by mouth daily.   No facility-administered encounter medications on file as of 11/04/2022.    REVIEW OF SYSTEMS:  Gen: Denies fever, sweats or chills. No weight loss.  CV: Denies chest pain, palpitations or edema. Resp: Denies cough, shortness of breath of hemoptysis.  GI:See HPI. GU : Denies urinary burning, blood in urine, increased urinary frequency or incontinence. MS: Denies joint pain, muscles aches or weakness. Derm: Denies rash, itchiness, skin lesions or unhealing ulcers. Psych: Denies depression, anxiety, memory loss or confusion. Heme: Denies bruising, easy bleeding. Neuro:  Denies headaches, dizziness or paresthesias. Endo:  Denies any problems with DM, thyroid or adrenal function.  PHYSICAL EXAM: Wt Readings from Last 3 Encounters:  11/04/22 159 lb 6.4 oz (72.3 kg)  08/22/22 165 lb (74.8 kg)  06/04/22 163 lb (73.9 kg)    BP 102/70   Pulse 80   Ht 5\' 7"  (1.702 m)   Wt 159 lb 6.4 oz (72.3 kg)   BMI 24.97 kg/m   General: 52 year old female in no acute distress. Head: Normocephalic and atraumatic. Eyes:  Sclerae non-icteric, conjunctive pink. Ears: Normal auditory acuity. Mouth: Dentition intact. No ulcers or lesions.  Neck: Supple, no lymphadenopathy or thyromegaly.  Lungs: Clear bilaterally to auscultation without wheezes, crackles or rhonchi. Heart: Regular rate and rhythm. No murmur, rub or gallop  appreciated.  Abdomen: Soft, nondistended.  Mild tenderness to the central lower abdomen without rebound or guarding.  No masses. No hepatosplenomegaly. Normoactive bowel sounds x 4 quadrants.  Rectal: Deferred. Musculoskeletal: Symmetrical with no gross deformities. Skin: Warm and dry. No rash or lesions on visible extremities. Extremities: No edema. Neurological: Alert oriented x 4, no focal deficits.  Psychological:  Alert and cooperative. Normal mood and affect.  ASSESSMENT AND PLAN:  52 year old female with chronic diarrhea. Normal colonoscopy in 2014. Negative celiac serology 10/2012. Normal TSH 03/2022. -Colonoscopy benefits and risks discussed including risk with sedation,  risk of bleeding, perforation and infection  -No dairy products for 2 weeks or take Lactaid 1-2 tabs with each dairy product -Reduce coffee intake -Next 2 weeks take dicyclomine 10 mg 1 p.o. 30 minutes before breakfast and dinner for suspected IBS/nervous intestine  Chronic heartburn and RUQ pain -GERD diet discussed, handout given  -No NSAIDs -Famotidine 20 mg 1 p.o. twice daily -CBC, CMP and CRP -EGD benefits and risks discussed including risk with sedation, risk of bleeding, perforation and infection  -RUQ sonogram to evaluate the gallbladder if EGD unrevealing  Intermittent central abdominal pain -Labs as ordered above -CTAP if significant abdominal pain occurs -Dicyclomine 10mg  may be taken 1 p.o. every 8 hours as needed   Further recommendations to be determined after EGD and colonoscopy completed          CC:  Sharion Balloon, FNP

## 2022-11-04 NOTE — Patient Instructions (Addendum)
You have been scheduled for an endoscopy and colonoscopy. Please follow the written instructions given to you at your visit today. Please pick up your prep supplies at the pharmacy within the next 1-3 days. If you use inhalers (even only as needed), please bring them with you on the day of your procedure.  We have sent the following medications to your pharmacy for you to pick up at your convenience: Dicyclomine 10 mg & Famotidine 20 mg   Your provider has requested that you go to the basement level for lab work before leaving today. Press "B" on the elevator. The lab is located at the first door on the left as you exit the elevator.  No dairy products or 1-2 Lactaid tablets with each dairy product   Reduce coffee.  Due to recent changes in healthcare laws, you may see the results of your imaging and laboratory studies on MyChart before your provider has had a chance to review them.  We understand that in some cases there may be results that are confusing or concerning to you. Not all laboratory results come back in the same time frame and the provider may be waiting for multiple results in order to interpret others.  Please give [place] 48 hours in order for your provider to thoroughly review all the results before contacting the office for clarification of your results.   Thank you for trusting me with your gastrointestinal care!   Carl Best, CRNP

## 2022-11-05 NOTE — Progress Notes (Signed)
I agree with the assessment and plan as outlined by Ms. Kennedy-Smith. 

## 2022-12-09 ENCOUNTER — Ambulatory Visit (AMBULATORY_SURGERY_CENTER): Payer: 59 | Admitting: Internal Medicine

## 2022-12-09 ENCOUNTER — Other Ambulatory Visit (HOSPITAL_COMMUNITY): Payer: Self-pay

## 2022-12-09 ENCOUNTER — Encounter: Payer: Self-pay | Admitting: Internal Medicine

## 2022-12-09 ENCOUNTER — Other Ambulatory Visit: Payer: Self-pay

## 2022-12-09 ENCOUNTER — Telehealth: Payer: Self-pay

## 2022-12-09 VITALS — BP 100/53 | HR 61 | Temp 96.4°F | Resp 11 | Ht 67.0 in | Wt 159.0 lb

## 2022-12-09 DIAGNOSIS — K2970 Gastritis, unspecified, without bleeding: Secondary | ICD-10-CM | POA: Diagnosis not present

## 2022-12-09 DIAGNOSIS — K2990 Gastroduodenitis, unspecified, without bleeding: Secondary | ICD-10-CM

## 2022-12-09 DIAGNOSIS — Z79899 Other long term (current) drug therapy: Secondary | ICD-10-CM | POA: Diagnosis not present

## 2022-12-09 DIAGNOSIS — K3189 Other diseases of stomach and duodenum: Secondary | ICD-10-CM | POA: Diagnosis not present

## 2022-12-09 DIAGNOSIS — K219 Gastro-esophageal reflux disease without esophagitis: Secondary | ICD-10-CM

## 2022-12-09 DIAGNOSIS — K2980 Duodenitis without bleeding: Secondary | ICD-10-CM | POA: Diagnosis not present

## 2022-12-09 DIAGNOSIS — K317 Polyp of stomach and duodenum: Secondary | ICD-10-CM

## 2022-12-09 DIAGNOSIS — K529 Noninfective gastroenteritis and colitis, unspecified: Secondary | ICD-10-CM

## 2022-12-09 DIAGNOSIS — R1011 Right upper quadrant pain: Secondary | ICD-10-CM

## 2022-12-09 DIAGNOSIS — R197 Diarrhea, unspecified: Secondary | ICD-10-CM

## 2022-12-09 DIAGNOSIS — E039 Hypothyroidism, unspecified: Secondary | ICD-10-CM | POA: Diagnosis not present

## 2022-12-09 MED ORDER — OMEPRAZOLE 40 MG PO CPDR
40.0000 mg | DELAYED_RELEASE_CAPSULE | Freq: Two times a day (BID) | ORAL | 1 refills | Status: DC
Start: 2022-12-09 — End: 2022-12-09
  Filled 2022-12-09: qty 60, 30d supply, fill #0

## 2022-12-09 MED ORDER — SODIUM CHLORIDE 0.9 % IV SOLN: 500.0000 mL | Freq: Once | INTRAVENOUS | Status: DC

## 2022-12-09 MED ORDER — OMEPRAZOLE 40 MG PO CPDR
40.0000 mg | DELAYED_RELEASE_CAPSULE | Freq: Two times a day (BID) | ORAL | 2 refills | Status: DC
Start: 2022-12-09 — End: 2024-04-13
  Filled 2022-12-09: qty 60, 30d supply, fill #0
  Filled 2023-03-18: qty 60, 30d supply, fill #1
  Filled 2023-11-26: qty 60, 30d supply, fill #2

## 2022-12-09 NOTE — Op Note (Signed)
[facility] Patient Name: Katherine Hays Procedure Date: 12/09/2022 3:15 PM MRN: [id number] Endoscopist: Particia Lather , , [id number] Age: 52 Referring MD:  Date of Birth: 09/13/1970 Gender: Female Account #: [id number] Procedure:                Upper GI endoscopy Indications:              Abdominal pain in the right upper quadrant,                            Heartburn Medicines:                Monitored Anesthesia Care Procedure:                Pre-Anesthesia Assessment:                           - Prior to the procedure, a History and Physical                            was performed, and patient medications and                            allergies were reviewed. The patient's tolerance of                            previous anesthesia was also reviewed. The risks                            and benefits of the procedure and the sedation                            options and risks were discussed with the patient.                            All questions were answered, and informed consent                            was obtained. Prior Anticoagulants: The patient has                            taken no anticoagulant or antiplatelet agents. ASA                            Grade Assessment: II - A patient with mild systemic                            disease. After reviewing the risks and benefits,                            the patient was deemed in satisfactory condition to                            undergo the procedure.  After obtaining informed consent, the endoscope was                            passed under direct vision. Throughout the                            procedure, the patient's blood pressure, pulse, and                            oxygen saturations were monitored continuously. The                            GIF [id number] #[id number] was introduced through the                            mouth, and advanced to the second part of  duodenum.                            The upper GI endoscopy was accomplished without                            difficulty. The patient tolerated the procedure                            well. Scope In: Scope Out: Findings:                 One benign-appearing, intrinsic mild                            (non-circumferential scarring) stenosis was found                            at the gastroesophageal junction. The stenosis was                            traversed. Biopsies were taken with a cold forceps                            for histology of the stricture and rest of the                            esophagus.                           A small hiatal hernia was present.                           Five 5 to 10 mm sessile polyps with no bleeding and                            no stigmata of recent bleeding were found in the                            gastric body. These polyps were  removed with a cold                            snare. Resection and retrieval were complete.                           Localized inflammation characterized by congestion                            (edema) and erythema was found in the gastric                            antrum. Biopsies were taken with a cold forceps for                            histology.                           Localized inflammation characterized by congestion                            (edema), erythema and granularity was found in the                            duodenal bulb. Biopsies were taken with a cold                            forceps for histology. Complications:            No immediate complications. Estimated Blood Loss:     Estimated blood loss was minimal. Impression:               - Benign-appearing esophageal stenosis. Biopsied                            along with the rest of the esophagus.                           - Small hiatal hernia.                           - Gastritis. Biopsied.                           -  Duodenitis. Biopsied. Recommendation:           - Await pathology results.                           - Use Prilosec (omeprazole) 40 mg PO BID for 10                            weeks.                           - Perform a colonoscopy today.                           -  Return to GI clinic in 2 months. Dr Particia Lather "[name]" [name],  12/09/2022 4:13:10 PM

## 2022-12-09 NOTE — Telephone Encounter (Signed)
Order entered. Staff message to the Radiology Scheduling to contact the patient and schedule.

## 2022-12-09 NOTE — Patient Instructions (Signed)
Discharge instructions given. Handouts on Diverticulosis,Hemorrhoids and Gastritis. Office visit scheduled. Prescriptions sent to pharmacy. YOU HAD AN ENDOSCOPIC PROCEDURE TODAY AT THE [facility]:   Refer to the procedure report that was given to you for any specific questions about what was found during the examination.  If the procedure report does not answer your questions, please call your gastroenterologist to clarify.  If you requested that your care partner not be given the details of your procedure findings, then the procedure report has been included in a sealed envelope for you to review at your convenience later.  YOU SHOULD EXPECT: Some feelings of bloating in the abdomen. Passage of more gas than usual.  Walking can help get rid of the air that was put into your GI tract during the procedure and reduce the bloating. If you had a lower endoscopy (such as a colonoscopy or flexible sigmoidoscopy) you may notice spotting of blood in your stool or on the toilet paper. If you underwent a bowel prep for your procedure, you may not have a normal bowel movement for a few days.  Please Note:  You might notice some irritation and congestion in your nose or some drainage.  This is from the oxygen used during your procedure.  There is no need for concern and it should clear up in a day or so.  SYMPTOMS TO REPORT IMMEDIATELY:  Following lower endoscopy (colonoscopy or flexible sigmoidoscopy):  Excessive amounts of blood in the stool  Significant tenderness or worsening of abdominal pains  Swelling of the abdomen that is new, acute  Fever of 100F or higher  Following upper endoscopy (EGD)  Vomiting of blood or coffee ground material  New chest pain or pain under the shoulder blades  Painful or persistently difficult swallowing  New shortness of breath  Fever of 100F or higher  Black, tarry-looking stools  For urgent or emergent issues, a gastroenterologist can be reached at  any hour by calling [phone]. Do not use MyChart messaging for urgent concerns.    DIET:  We do recommend a small meal at first, but then you may proceed to your regular diet.  Drink plenty of fluids but you should avoid alcoholic beverages for 24 hours.  ACTIVITY:  You should plan to take it easy for the rest of today and you should NOT DRIVE or use heavy machinery until tomorrow (because of the sedation medicines used during the test).    FOLLOW UP: Our staff will call the number listed on your records the next business day following your procedure.  We will call around 7:15- 8:00 am to check on you and address any questions or concerns that you may have regarding the information given to you following your procedure. If we do not reach you, we will leave a message.     If any biopsies were taken you will be contacted by phone or by letter within the next 1-3 weeks.  Please call us at [phone] if you have not heard about the biopsies in 3 weeks.    SIGNATURES/CONFIDENTIALITY: You and/or your care partner have signed paperwork which will be entered into your electronic medical record.  These signatures attest to the fact that that the information above on your After Visit Summary has been reviewed and is understood.  Full responsibility of the confidentiality of this discharge information lies with you and/or your care-partner.

## 2022-12-09 NOTE — Progress Notes (Signed)
Pt resting comfortably. VSS. Airway intact. SBAR complete to RN. All questions answered.   

## 2022-12-09 NOTE — Progress Notes (Unsigned)
Called to room to assist during endoscopic procedure.  Patient ID and intended procedure confirmed with present staff. Received instructions for my participation in the procedure from the performing physician.  

## 2022-12-09 NOTE — Progress Notes (Unsigned)
GASTROENTEROLOGY PROCEDURE H&P NOTE   Primary Care Physician: Junie Spencer, FNP    Reason for Procedure:   RUQ ab pain, GERD, chronic diarrhea  Plan:    EGD/colonoscopy  Patient is appropriate for endoscopic procedure(s) in the ambulatory ([facility]) setting.  The nature of the procedure, as well as the risks, benefits, and alternatives were carefully and thoroughly reviewed with the patient. Ample time for discussion and questions allowed. The patient understood, was satisfied, and agreed to proceed.     HPI: Katherine Hays is a 52 y.o. female who presents for EGD/colonoscopy for evaluation of RUQ ab pain, GERD, and chronic diarrhea .  Patient was most recently seen in the Gastroenterology Clinic on 11/04/22.  No interval change in medical history since that appointment. Please refer to that note for full details regarding GI history and clinical presentation.   Past Medical History:  Diagnosis Date   Anxiety    Arthritis    in neck   Asthma    GERD (gastroesophageal reflux disease)    OCD (obsessive compulsive disorder)    Thyroid disease    hypo    Past Surgical History:  Procedure Laterality Date   ROTATOR CUFF REPAIR Left 10/22/2021   TONSILLECTOMY     TUBAL LIGATION     WISDOM TOOTH EXTRACTION      Prior to Admission medications   Medication Sig Start Date End Date Taking? Authorizing Provider  levonorgestrel (MIRENA) 20 MCG/24HR IUD 1 each by Intrauterine route once.   Yes [provider]  levothyroxine (SYNTHROID) 50 MCG tablet Take 1 tablet (50 mcg total) by mouth daily before breakfast. 04/16/22  Yes Hawks, Christy A, FNP  omeprazole (PRILOSEC) 20 MG capsule Take 1 capsule by mouth daily. Patient taking differently: Take 20 mg by mouth daily. Taking prn 04/16/22  Yes Hawks, Edilia Bo, FNP  Wheat Dextrin (BENEFIBER) POWD Take by mouth daily.   Yes [provider]  ALPRAZolam Prudy Feeler) 0.5 MG tablet Take 0.5 mg by mouth at bedtime as needed  for anxiety.    [provider]  dicyclomine (BENTYL) 10 MG capsule Take 1 capsule (10 mg total) by mouth 2 (two) times daily. Take 30 minutes before breakfast and dinner 11/04/22   Arnaldo Natal, NP  famotidine (PEPCID) 20 MG tablet Take 1 tablet (20 mg total) by mouth 2 (two) times daily. 11/04/22   Arnaldo Natal, NP  sertraline (ZOLOFT) 100 MG tablet Take 1 tablet (100 mg total) by mouth daily. 09/25/22   Junie Spencer, FNP    Current Outpatient Medications  Medication Sig Dispense Refill   levonorgestrel (MIRENA) 20 MCG/24HR IUD 1 each by Intrauterine route once.     levothyroxine (SYNTHROID) 50 MCG tablet Take 1 tablet (50 mcg total) by mouth daily before breakfast. 90 tablet 3   omeprazole (PRILOSEC) 20 MG capsule Take 1 capsule by mouth daily. (Patient taking differently: Take 20 mg by mouth daily. Taking prn) 90 capsule 4   Wheat Dextrin (BENEFIBER) POWD Take by mouth daily.     ALPRAZolam (XANAX) 0.5 MG tablet Take 0.5 mg by mouth at bedtime as needed for anxiety.     dicyclomine (BENTYL) 10 MG capsule Take 1 capsule (10 mg total) by mouth 2 (two) times daily. Take 30 minutes before breakfast and dinner 60 capsule 0   famotidine (PEPCID) 20 MG tablet Take 1 tablet (20 mg total) by mouth 2 (two) times daily. 60 tablet 0   sertraline (ZOLOFT) 100 MG  tablet Take 1 tablet (100 mg total) by mouth daily. 90 tablet 1   Current Facility-Administered Medications  Medication Dose Route Frequency Provider Last Rate Last Admin   0.9 %  sodium chloride infusion  500 mL Intravenous Once Imogene Burn, MD        Allergies as of 12/09/2022 - Review Complete 12/09/2022  Allergen Reaction Noted   Codeine  05/25/2012    Family History  Problem Relation Age of Onset   Hyperthyroidism Mother    Liver disease Father        cirrhosis   Hypertension Brother    Diabetes Maternal Grandfather    Stroke Maternal Grandfather    Breast cancer Paternal Grandmother     Cancer Paternal Grandmother        breast   Heart disease Paternal Grandfather    Stroke Paternal Grandfather    Pancreatic cancer Maternal Uncle    Crohn's disease Other        Nephew   Colon cancer Neg Hx    Stomach cancer Neg Hx    Esophageal cancer Neg Hx     Social History   Socioeconomic History   Marital status: Married    Spouse name: Not on file   Number of children: 3   Years of education: Not on file   Highest education level: Not on file  Occupational History   Occupation: Forensic scientist: [employer]  Tobacco Use   Smoking status: Never   Smokeless tobacco: Never  Vaping Use   Vaping Use: Never used  Substance and Sexual Activity   Alcohol use: No   Drug use: No   Sexual activity: Yes  Other Topics Concern   Not on file  Social History Narrative   Not on file   Social Determinants of Health   Financial Resource Strain: Not on file  Food Insecurity: Not on file  Transportation Needs: Not on file  Physical Activity: Not on file  Stress: Not on file  Social Connections: Not on file  Intimate Partner Violence: Not on file    Physical Exam: Vital signs in last 24 hours: BP (!) 150/112   Pulse 93   Temp (!) 96.4 F (35.8 C)   Ht  (1.702 m)   Wt 159 lb (72.1 kg)   SpO2 100%   BMI 24.90 kg/m  GEN: NAD EYE: Sclerae anicteric ENT: MMM CV: Non-tachycardic Pulm: No increased WOB GI: Soft NEURO:  Alert & Oriented   Eulah Pont, MD [facility]   12/09/2022 2:57 PM

## 2022-12-09 NOTE — Telephone Encounter (Signed)
-----   Message from Imogene Burn, MD sent at 12/09/2022  4:15 PM EDT ----- Hi Beth, let's get her scheduled for a RUQ U/S for RUQ ab pain. Thanks

## 2022-12-09 NOTE — Progress Notes (Signed)
1543 - Pt with good respiratory effort. Palpable exhalation noted in front of mouth. Poor ETCO2 waveform at this time. Will monitor. VSS

## 2022-12-09 NOTE — Op Note (Addendum)
[facility] Patient Name: Katherine Hays Procedure Date: 12/09/2022 3:15 PM MRN: [id number] Endoscopist: Particia Lather , , [id number] Age: 52 Referring MD:  Date of Birth: [birth year] Gender: Female Account #: [id number] Procedure:                Colonoscopy Indications:              Diarrhea Medicines:                Monitored Anesthesia Care Procedure:                Pre-Anesthesia Assessment:                           - Prior to the procedure, a History and Physical                            was performed, and patient medications and                            allergies were reviewed. The patient's tolerance of                            previous anesthesia was also reviewed. The risks                            and benefits of the procedure and the sedation                            options and risks were discussed with the patient.                            All questions were answered, and informed consent                            was obtained. Prior Anticoagulants: The patient has                            taken no anticoagulant or antiplatelet agents. ASA                            Grade Assessment: II - A patient with mild systemic                            disease. After reviewing the risks and benefits,                            the patient was deemed in satisfactory condition to                            undergo the procedure.                           After obtaining informed consent, the colonoscope  was passed under direct vision. Throughout the                            procedure, the patient's blood pressure, pulse, and                            oxygen saturations were monitored continuously. The                            Olympus CF-HQ190L ([id number]) Colonoscope was                            introduced through the anus and advanced to the the                            terminal ileum. The colonoscopy was  performed                            without difficulty. The patient tolerated the                            procedure well. The quality of the bowel                            preparation was good. The terminal ileum, ileocecal                            valve, appendiceal orifice, and rectum were                            photographed. Scope In: 3:49:07 PM Scope Out: 4:06:21 PM Scope Withdrawal Time: 0 hours 10 minutes 47 seconds  Total Procedure Duration: 0 hours 17 minutes 14 seconds  Findings:                 The terminal ileum appeared normal.                           Multiple diverticula were found in the sigmoid                            colon and descending colon.                           Non-bleeding internal hemorrhoids were found during                            retroflexion.                           The exam was otherwise without abnormality. Complications:            No immediate complications. Estimated Blood Loss:     Estimated blood loss: none. Impression:               - The examined portion of the ileum was normal.                           -  Diverticulosis in the sigmoid colon and in the                            descending colon.                           - Non-bleeding internal hemorrhoids.                           - The examination was otherwise normal.                           - No specimens collected. Recommendation:           - Discharge patient to home (with escort).                           - Await pathology results.                           - Proceed with RUQ ultrasound to rule out                            gallstones to evaluate RUQ ab pain.                           - The findings and recommendations were discussed                            with the patient. Dr Particia Lather "[name]" [name],  12/09/2022 4:18:11 PM Addendum Number: 1   Addendum Date: 12/13/2022 1:31:13 PM      Random colon biopsies were taken throughout the colon to  evaluate for       microscopic colitis. Dr Particia Lather "[name]" [name],  12/13/2022 1:31:36 PM

## 2022-12-10 ENCOUNTER — Telehealth: Payer: Self-pay | Admitting: *Deleted

## 2022-12-10 NOTE — Telephone Encounter (Signed)
Attempted f/u phone call. No answer. Left message. °

## 2022-12-12 NOTE — Telephone Encounter (Signed)
Spoke with the patient. She has an abdominal u/s limited tomorrow. She is eating very little. Confirmed she is taking Omeprazole BID as ordered. She reports feeling "very tired, low-grade temp (99.0 oral) and bringing up mucous." She has eaten only toast today. "Stayed in the bed yesterday." Asked if she wanted any antiemetics, she declines. She asks if it is expected that she will feel this way.

## 2022-12-12 NOTE — Telephone Encounter (Signed)
Inbound call from patient states she has been experiencing abdominal pain, vomiting as well as feeling tired. Patient had a procedure previously and is requesting a call back from a nurse to discuss symptoms. Please advise.

## 2022-12-13 ENCOUNTER — Encounter: Payer: Self-pay | Admitting: Internal Medicine

## 2022-12-13 ENCOUNTER — Ambulatory Visit (HOSPITAL_COMMUNITY)
Admission: RE | Admit: 2022-12-13 | Discharge: 2022-12-13 | Disposition: A | Discharge status: Home / Self Care | Payer: 59 | Source: Ambulatory Visit | Attending: Internal Medicine | Admitting: Internal Medicine

## 2022-12-13 DIAGNOSIS — R1011 Right upper quadrant pain: Secondary | ICD-10-CM | POA: Diagnosis not present

## 2022-12-13 DIAGNOSIS — K824 Cholesterolosis of gallbladder: Secondary | ICD-10-CM | POA: Diagnosis not present

## 2022-12-13 NOTE — Telephone Encounter (Addendum)
Called and spoke to the patient. She is feeling better today, more back to normal. Not clear what triggered her recent symptoms. She is continuing to take omeprazole BID. I let her know that her abdominal ultrasound today did not show an obvious cause of pain. She was noted to have a small gallbladder polyp, which can sometimes be a source of abdominal pain. If she does not respond to omeprazole in the future, then could consider referral for a cholecystectomy. Will plan for routine clinic follow up as already scheduled.

## 2023-01-03 ENCOUNTER — Other Ambulatory Visit: Payer: Self-pay

## 2023-01-04 ENCOUNTER — Other Ambulatory Visit: Payer: Self-pay | Admitting: Family

## 2023-01-04 DIAGNOSIS — F411 Generalized anxiety disorder: Secondary | ICD-10-CM

## 2023-01-04 DIAGNOSIS — Z79899 Other long term (current) drug therapy: Secondary | ICD-10-CM

## 2023-01-04 DIAGNOSIS — F1320 Sedative, hypnotic or anxiolytic dependence, uncomplicated: Secondary | ICD-10-CM

## 2023-01-06 ENCOUNTER — Other Ambulatory Visit: Payer: Self-pay

## 2023-01-06 ENCOUNTER — Other Ambulatory Visit (HOSPITAL_COMMUNITY): Payer: Self-pay

## 2023-01-06 MED ORDER — ALPRAZOLAM 0.5 MG PO TABS
0.5000 mg | ORAL_TABLET | Freq: Two times a day (BID) | ORAL | 5 refills | Status: DC
Start: 2023-01-06 — End: 2023-04-18
  Filled 2023-01-06: qty 60, 30d supply, fill #0

## 2023-01-09 DIAGNOSIS — Z1231 Encounter for screening mammogram for malignant neoplasm of breast: Secondary | ICD-10-CM | POA: Diagnosis not present

## 2023-01-09 DIAGNOSIS — Z01419 Encounter for gynecological examination (general) (routine) without abnormal findings: Secondary | ICD-10-CM | POA: Diagnosis not present

## 2023-01-09 LAB — HM MAMMOGRAPHY

## 2023-02-10 ENCOUNTER — Ambulatory Visit: Payer: 59 | Admitting: Nurse Practitioner

## 2023-02-11 ENCOUNTER — Encounter: Payer: Self-pay | Admitting: Family

## 2023-02-11 ENCOUNTER — Ambulatory Visit (INDEPENDENT_AMBULATORY_CARE_PROVIDER_SITE_OTHER): Payer: 59 | Admitting: Family

## 2023-02-11 VITALS — BP 104/68 | HR 79 | Temp 96.7°F | Ht 67.0 in | Wt 160.0 lb

## 2023-02-11 DIAGNOSIS — S76219A Strain of adductor muscle, fascia and tendon of unspecified thigh, initial encounter: Secondary | ICD-10-CM

## 2023-02-11 DIAGNOSIS — R1031 Right lower quadrant pain: Secondary | ICD-10-CM | POA: Diagnosis not present

## 2023-02-11 DIAGNOSIS — S76211A Strain of adductor muscle, fascia and tendon of right thigh, initial encounter: Secondary | ICD-10-CM

## 2023-02-11 MED ORDER — DICLOFENAC SODIUM 75 MG PO TBEC
75.0000 mg | DELAYED_RELEASE_TABLET | Freq: Two times a day (BID) | ORAL | 1 refills | Status: DC
Start: 2023-02-11 — End: 2023-04-18

## 2023-02-11 NOTE — Progress Notes (Signed)
   Subjective:    Patient ID: Katherine Hays, female    DOB: 05-15-1971, 52 y.o.   MRN: [id number]  Chief Complaint  Patient presents with   Pelvic Pain   PT presents to the office today with right groin pain that radiates down right leg that started a year ago. Comes and goes. She has taken tylenol, motrin, and stretches with mild relief. Reports aching pain of 5 out 10. Denies any injury.  Groin Pain      Review of Systems  All other systems reviewed and are negative.      Objective:   Physical Exam Vitals reviewed.  Constitutional:      General: She is not in acute distress.    Appearance: She is well-developed.  HENT:     Head: Normocephalic and atraumatic.  Eyes:     Pupils: Pupils are equal, round, and reactive to light.  Neck:     Thyroid: No thyromegaly.  Cardiovascular:     Rate and Rhythm: Normal rate and regular rhythm.     Heart sounds: Normal heart sounds. No murmur heard. Pulmonary:     Effort: Pulmonary effort is normal. No respiratory distress.     Breath sounds: Normal breath sounds. No wheezing.  Abdominal:     General: Bowel sounds are normal. There is no distension.     Palpations: Abdomen is soft.     Tenderness: There is no abdominal tenderness.  Musculoskeletal:        General: No tenderness. Normal range of motion.     Cervical back: Normal range of motion and neck supple.     Comments: Full ROM or right hip  Skin:    General: Skin is warm and dry.  Neurological:     Mental Status: She is alert and oriented to person, place, and time.     Cranial Nerves: No cranial nerve deficit.     Deep Tendon Reflexes: Reflexes are normal and symmetric.  Psychiatric:        Behavior: Behavior normal.        Thought Content: Thought content normal.        Judgment: Judgment normal.     BP 104/68   Pulse 79   Temp (!) 96.7 F (35.9 C) (Temporal)   Ht 5\' 7"  (1.702 m)   Wt 160 lb (72.6 kg)   SpO2 97%   BMI 25.06 kg/m        Assessment &  Plan:   CHIANTI GOH comes in today with chief complaint of Pelvic Pain   Diagnosis and orders addressed:  1. Groin pain, right - DG HIP UNILAT W OR W/O PELVIS 2-3 VIEWS RIGHT - diclofenac (VOLTAREN) 75 MG EC tablet; Take 1 tablet (75 mg total) by mouth 2 (two) times daily.  Dispense: 60 tablet; Refill: 1 - Ambulatory referral to Physical Therapy  2. Strain of adductor muscle of thigh   Start Diclofenac BID with food No other NSAID's ROM exercises encouraged- Handout given  Referral to PT Follow up if symptoms worsen or do not improve   Jannifer Rodney, FNP

## 2023-02-11 NOTE — Patient Instructions (Addendum)
Adductor Muscle Strain  An adductor muscle strain, also called a groin strain or pull, is an injury to the muscles or tendons on the upper, inner part of the thigh. These muscles are called the adductor or groin muscles. They are responsible for moving the legs across the body or pulling the legs together. A muscle strain occurs when a muscle is overstretched and some muscle fibers are torn. The severity of an adductor muscle strain is rated as Grade 1, 2, or 3. A Grade 3 strain has the most tearing and pain. What are the causes? Adductor muscle strains usually occur during exercise or while participating in sports. This condition may be caused by: A sudden, violent force placed on the muscle, stretching it too far. Stretching the muscles too far or too suddenly, often during side-to-side motion with a sudden change in direction. Putting repeated stress on the adductor muscles over a long period of time. Performing vigorous activity without properly stretching or warming up the adductor muscles beforehand. Not being properly conditioned. What are the signs or symptoms? Symptoms of this condition include: Pain and tenderness in the groin area. This begins as sharp pain and persists as a dull ache. A popping or snapping feeling when the injury occurs (for severe strains). Swelling or bruising. Muscle spasms. Weakness in the leg. Stiffness in the groin area with decreased ability to move the affected muscles. How is this diagnosed? This condition may be diagnosed based on: A physical exam. Your medical history. How well you can do certain range of motion exercises. Imaging tests, such as MRI, ultrasound, or X-rays. Your strain may be rated based on how severe it is. The ratings are: Grade 1 strain (mild). Muscles are overstretched. There may be very small muscle tears. This type of strain generally heals in about one week. Grade 2 strain (moderate). Muscles are partially torn. This may  take one to two months to heal. Grade 3 strain (severe). Muscles are completely torn. A severe strain can take more than three months to heal. Grade 3 gluteal strains are rare. How is this treated? An adductor strain will often heal on its own. If needed, this condition may be treated with: PRICE therapy. PRICE stands for protection of the injured area, rest, ice, pressure (compression), and elevation. Medicines to help manage pain and swelling (anti-inflammatory medicines). Crutches. You may be directed to use these for the first few days to minimize your pain. Depending on the severity of the muscle strain, recovery time may vary from a few weeks to several months. Severe injuries often require 4-6 weeks for recovery. In those cases, complete healing can take 4-5 months. Follow these instructions at home: PRICE Therapy  Protect the muscle from being injured again. Rest. Do not use the strained muscle if it causes pain. If directed, put ice on the injured area: Put ice in a plastic bag. Place a towel between your skin and the bag. Leave the ice on for 20 minutes, 2-3 times a day. Do this for the first 2 days after the injury. Apply compression by wrapping the injured area with an elastic bandage as told by your health care provider. Raise (elevate) the injured area above the level of your heart while you are sitting or lying down. Activity Do not drive or use heavy machinery while taking prescription pain medicine. Walk, stretch, and do exercises as told by your health care provider. Only do these activities if you can do so without any pain. General instructions  Take over-the-counter and prescription medicines only as told by your health care provider. Follow your treatment plan as told by your health care provider. This may include: Physical therapy. Massage. Local electrical stimulation (transcutaneous electrical nerve stimulation, TENS). Keep all follow-up visits. This is  important. How is this prevented? Warm up and stretch before being active. Cool down and stretch after being active. Give your body time to rest between periods of activity. Make sure to use equipment that fits you. Be safe and responsible while being active to avoid slips and falls. Maintain physical fitness, including: Proper conditioning in the adductor muscles. Overall strength, flexibility, and endurance. Contact a health care provider if: You have increased pain or swelling in the affected area. Your symptoms are not improving or they are getting worse. Summary An adductor muscle strain, also called a groin strain or pull, is an injury to the muscles or tendons on the upper, inner part of the thigh. A muscle strain occurs when a muscle is overstretched and some muscle fibers are torn. Depending on the severity of the muscle strain, recovery time may vary from a few weeks to several months. Adductor Muscle Strain With Rehab Ask your health care provider which exercises are safe for you. Do exercises exactly as told by your health care provider and adjust them as directed. It is normal to feel mild stretching, pulling, tightness, or mild discomfort as you do these exercises. Stop right away if you feel sudden pain or your pain gets worse. Do not begin these exercises until told by your health care provider. Strengthening exercises These exercises build strength and endurance in your thighs. Endurance is the ability to use your muscles for a long time, even after your muscles get tired. Hip adductor isometrics This exercise is sometimes called inner thigh squeeze. Sit on a firm chair that positions your knees at about the same height as your hips. Place a large ball, firm pillow, or rolled-up bath towel between your thighs. Squeeze your thighs together, gradually building tension. Hold for __________ seconds. Release the tension gradually. Allow your inner thigh muscles to relax  completely before you start the next repetition. Repeat __________ times. Complete this exercise __________ times a day. Hip adduction This exercise is sometimes called side-lying straight leg raises. Lie on your side so your head, shoulder, knee, and hip are in a straight line with each other. To help maintain your balance, you may put the foot of your top leg in front of the leg that is on the floor. Your left / right leg should be on the bottom. Roll your hips slightly forward so your hips are stacked directly over each other and your left / right knee is facing forward. Tense the muscles of your inner thigh and lift your bottom leg 4-6 inches (10-15 cm). Hold this position for __________ seconds. Slowly lower your leg to the starting position. Allow your muscles to relax completely before you start the next repetition. Repeat __________ times. Complete this exercise __________ times a day. Hip extension This exercise is sometimes called prone (on your belly) straight leg raises. Lie on your belly on a bed or a firm surface with a pillow under your hips. Tense your buttock muscles and lift your left / right thigh off the bed. Your left / right knee can be bent or straight, but do not let your back arch. Hold this position for __________ seconds. Slowly return to the starting position. Allow your muscles to relax completely before  you start the next repetition. Repeat __________ times. Complete this exercise __________ times a day. Balance exercises These exercises improve or maintain your balance. Balance is important in preventing falls. Single-leg balance  Stand near a railing or by a door frame that you can hold onto as needed. Stand on your left / right foot. Keep your big toe down on the floor and try to keep your arch lifted. If this is too easy, you can stand with your eyes closed or stand on a pillow. Hold this position for __________ seconds. Repeat __________ times. Complete  this exercise __________ times a day. Side lunges  Stand with your feet together. Keeping one foot in place, step to the side with your other foot about __________ inches (__________ cm). Do not step so far that you feel discomfort in your middle thigh. Push off from your stepping foot to return to the starting position. Repeat __________ times. Complete this exercise __________ times a day. This information is not intended to replace advice given to you by your health care provider. Make sure you discuss any questions you have with your health care provider. Document Revised: 01/23/2021 Document Reviewed: 01/23/2021 Elsevier Patient Education  2024 [employer].  This information is not intended to replace advice given to you by your health care provider. Make sure you discuss any questions you have with your health care provider. Document Revised: 01/03/2021 Document Reviewed: 01/03/2021 Elsevier Patient Education  2024 [employer].

## 2023-02-12 ENCOUNTER — Other Ambulatory Visit (INDEPENDENT_AMBULATORY_CARE_PROVIDER_SITE_OTHER): Payer: 59

## 2023-02-12 DIAGNOSIS — R1031 Right lower quadrant pain: Secondary | ICD-10-CM | POA: Diagnosis not present

## 2023-02-12 DIAGNOSIS — R1030 Lower abdominal pain, unspecified: Secondary | ICD-10-CM | POA: Diagnosis not present

## 2023-02-12 DIAGNOSIS — M160 Bilateral primary osteoarthritis of hip: Secondary | ICD-10-CM | POA: Diagnosis not present

## 2023-02-18 ENCOUNTER — Ambulatory Visit: Payer: 59 | Attending: Family

## 2023-02-18 ENCOUNTER — Other Ambulatory Visit: Payer: Self-pay

## 2023-02-18 DIAGNOSIS — R1031 Right lower quadrant pain: Secondary | ICD-10-CM | POA: Insufficient documentation

## 2023-02-18 DIAGNOSIS — M25551 Pain in right hip: Secondary | ICD-10-CM | POA: Insufficient documentation

## 2023-02-18 NOTE — Therapy (Signed)
OUTPATIENT PHYSICAL THERAPY LOWER EXTREMITY EVALUATION   Patient Name: Katherine Hays MRN: [id number] DOB:1971-02-21, 52 y.o., female Today's Date: 02/18/2023  END OF SESSION:  PT End of Session - 02/18/23 1516     Visit Number 1    Number of Visits 5    Date for PT Re-Evaluation 04/18/23    PT Start Time 1508    PT Stop Time 1551    PT Time Calculation (min) 43 min    Activity Tolerance Patient tolerated treatment well    Behavior During Therapy WFL for tasks assessed/performed             Past Medical History:  Diagnosis Date   Anxiety    Arthritis    in neck   Asthma    GERD (gastroesophageal reflux disease)    OCD (obsessive compulsive disorder)    Thyroid disease    hypo   Past Surgical History:  Procedure Laterality Date   ROTATOR CUFF REPAIR Left 10/22/2021   TONSILLECTOMY     TUBAL LIGATION     WISDOM TOOTH EXTRACTION     Patient Active Problem List   Diagnosis Date Noted   Low hemoglobin 11/15/2020   Controlled substance agreement signed 02/04/2019   Benzodiazepine dependence (HCC) 02/04/2019   Gastroesophageal reflux disease 02/04/2019   Vitamin D deficiency 11/13/2017   Hypothyroidism 11/13/2017   Hyperlipemia 11/13/2017   Insomnia 11/10/2017   History of OCD (obsessive compulsive disorder) 01/12/2014   GAD (generalized anxiety disorder) 01/12/2014   Inguinal lymphadenopathy 05/26/2012   REFERRING PROVIDER: Junie Spencer, FNP   REFERRING DIAG: Groin pain, right   THERAPY DIAG:  Pain in right hip  Rationale for Evaluation and Treatment: Rehabilitation  ONSET DATE: 2023  SUBJECTIVE:   SUBJECTIVE STATEMENT: Patient reports that she been having nerve pain for about a year with no known cause. She also has been having some numbness in the back of her right hip and thigh. She feels that it had been getting worse, but the medication she got at her appointment really helped. She has never had any pain like this before.   PERTINENT  HISTORY: Anxiety, OA, and asthma  PAIN:  Are you having pain? Yes: NPRS scale: 5/10 Pain location: right groin, posterior hip and thigh Pain description: intermittent numbness and sharp pain  Aggravating factors: sitting all day at work, stairs  Relieving factors: stretching  PRECAUTIONS: None  WEIGHT BEARING RESTRICTIONS: No  FALLS:  Has patient fallen in last 6 months? No  LIVING ENVIRONMENT: Lives with: lives with their family Lives in: House/apartment Stairs: Yes: Internal: 10-12 steps; on left going up Has following equipment at home: None  OCCUPATION: prolonged sitting  PLOF: Independent  PATIENT GOALS: reduced pain  NEXT MD VISIT: none scheduled  OBJECTIVE:   DIAGNOSTIC FINDINGS: 02/12/23 hip x-ray  IMPRESSION: Very mild bilateral femoroacetabular osteoarthritis, similar to prior.   Mild pubic symphysis osteoarthritis.  COGNITION: Overall cognitive status: Within functional limits for tasks assessed     SENSATION: Patient reports no numbness currently  POSTURE: increased lumbar lordosis  PALPATION: TTP: right hamstring and L4 spinous process  LUMBAR ROM:   Active  A/PROM  eval  Flexion 70  Extension 30  Right lateral flexion   Left lateral flexion   Right rotation No limitation  Left rotation No limitation   (Blank rows = not tested)   LOWER EXTREMITY ROM: WFL for activities assessed  LOWER EXTREMITY MMT:  MMT Right eval Left eval  Hip flexion 4/5  4+/5  Hip extension    Hip abduction    Hip adduction    Hip internal rotation    Hip external rotation    Knee flexion 4+/5; knee pain  4/5  Knee extension 5/5 5/5  Ankle dorsiflexion    Ankle plantarflexion    Ankle inversion    Ankle eversion     (Blank rows = not tested)  LOWER EXTREMITY SPECIAL TESTS:  Hip special tests: Luisa Hart (FABER) test: negative and Hip scouring test: negative Straight leg raise: negative Slump test: negative  GAIT: Assistive device utilized:  None Level of assistance: Complete Independence Comments: no significant gait deviations observed   TODAY'S TREATMENT:                                                                                                                              DATE:                                     02/18/23 EXERCISE LOG  Exercise Repetitions and Resistance Comments  Sciatic nerve flossing    Seated HS stretch  3 x 30 seconds                Blank cell = exercise not performed today   PATIENT EDUCATION:  Education details: HEP, POC, healing, anatomy, referred pain, prognosis, and goals for therapy Person educated: Patient Education method: Explanation Education comprehension: verbalized understanding  HOME EXERCISE PROGRAM: Today's interventions were added to her HEP. She reported feeling comfortable with these interventions.   ASSESSMENT:  CLINICAL IMPRESSION: Patient is a 52 y.o. female who was seen today for physical therapy evaluation and treatment for right hip pain with no known mechanism of injury. She presented with low pain severity and irritability with palpation to her right hamstring being the most aggravating to her familiar symptoms. She was provided a home exercise program which she was able to properly demonstrate. She reported feeling comfortable with these interventions. Recommend that she continue with skilled physical therapy to address her impairments to return to her prior level of function.  OBJECTIVE IMPAIRMENTS: decreased activity tolerance, decreased strength, and pain.   ACTIVITY LIMITATIONS: sitting  PARTICIPATION LIMITATIONS: occupation  PERSONAL FACTORS: Time since onset of injury/illness/exacerbation and 3+ comorbidities: Anxiety, OA, and asthma   are also affecting patient's functional outcome.   REHAB POTENTIAL: Good  CLINICAL DECISION MAKING: Stable/uncomplicated  EVALUATION COMPLEXITY: Low   GOALS: Goals reviewed with patient? Yes LONG TERM GOALS:  Target date: 03/18/23  Patient will be independent with her HEP Baseline:  Goal status: INITIAL  2.  Patient will be able to complete her daily activities without her familiar pain exceeding 3/10. Baseline:  Goal status: INITIAL  3.  Patient will be to complete her critical job demands without being limited by her familiar symptoms. Baseline:  Goal status: INITIAL  4.  Patient will  improve her right hip flexor strength to at least 4+/5. Baseline:  Goal status: INITIAL  PLAN:  PT FREQUENCY: 1-2x/week  PT DURATION: 4 weeks  PLANNED INTERVENTIONS: Therapeutic exercises, Therapeutic activity, Neuromuscular re-education, Patient/Family education, Self Care, Joint mobilization, Joint manipulation, Dry Needling, Electrical stimulation, Spinal manipulation, Spinal mobilization, Cryotherapy, Moist heat, Traction, Manual therapy, and Re-evaluation  PLAN FOR NEXT SESSION: Lumbar and lower extremity strengthening, nerve glides, and modalities as needed   Granville Lewis, PT 02/18/2023, 5:45 PM

## 2023-02-19 NOTE — Progress Notes (Unsigned)
02/25/2023 Katherine Hays [id number] 10-23-1970  Referring provider: Junie Spencer, FNP Primary GI doctor: Dr. [name]  ASSESSMENT AND PLAN:   Chronic diarrhea/fecal incontinence/urinary incontinence Also improvement after colonoscopy which is unremarkable Negative for microscopic colitis With discussing further with patient I believe she may have some pelvic floor dysfunction with urinary incontinence as well will refer to pelvic floor physical therapy Add fiber, FODMAP given  Gastroesophageal reflux disease with RUQ pain and gastritis seen on EGD Recently started on diclofenac, discussed any NSAIDs can increase gastritis Stop diclofenac or take short-term Will increase Prilosec 40 mg twice daily for 2 weeks then back down to 20 mg twice daily or 40 mg once daily Patient had 5 mm polyp on right upper quadrant ultrasound, no pain today, some issues with fatty foods, patient would like to wait and see how she does with PPI if she continues to have issues we will either get hide or refer to general surgery.    Patient Care Team: Junie Spencer, FNP as PCP - General (Family Medicine) Ob/Gyn, Nestor Ramp  HISTORY OF PRESENT ILLNESS: 52 y.o. female with a past medical history of anxiety, OCD, asthma and sciatica and others listed below presents for follow up of EGD/Colon.    10/2912 colonoscopy by Dr. Christella Hartigan which was normal, no evidence of colitis  Negative celiac 12/09/2022 colonoscopy Dr. [name] chronic diarrhea good prep normal TI, diverticulosis, nonbleeding external hemorrhoids  negative microscopic colits 12/09/2022 EGD for chronic GERD shows benign-appearing esophageal stenosis, small hiatal hernia, gastritis duodenitis benign biopsies RUQ [place] 5 mm gallbladder polyp, no cholelithiasis  Patient presents for follow-up. She is on prilosec 20 mg twice a day, she is on pepcid as needed once a week during the day.  She states she had pizza Saturday and has some AB  discomfort. No nausea, no vomiting, no melena.  Can have a bad/metallic taste in her mouth, some allergies.   She can have RUQ pain occ, can last 10 mins.  No family history of gallbladder issues.  She has been on diclofenac once a day for last 2 weeks with right lower back pinched nerve.  She states since the colonoscopy she has had no diarrhea.  Having solid BM daily, formed, no straining.  Has had more solid stools, but still can have some stool leakage small volume leakage as before. She has urinary leakage as well. She has history of 3 kids.  No AB pain, no hematochezia.   She  reports that she has never smoked. She has never used smokeless tobacco. She reports that she does not drink alcohol and does not use drugs.  RELEVANT LABS AND IMAGING: CBC    Component Value Date/Time   WBC 5.5 11/04/2022 1218   RBC 4.67 11/04/2022 1218   HGB 13.8 11/04/2022 1218   HGB 13.4 04/16/2022 0759   HCT 41.5 11/04/2022 1218   HCT 40.7 04/16/2022 0759   PLT 376.0 11/04/2022 1218   PLT 327 04/16/2022 0759   MCV 88.9 11/04/2022 1218   MCV 90 04/16/2022 0759   MCH 29.5 04/16/2022 0759   MCH 27.2 02/16/2015 1202   MCHC 33.2 11/04/2022 1218   RDW 12.7 11/04/2022 1218   RDW 12.3 04/16/2022 0759   LYMPHSABS 1.7 11/04/2022 1218   LYMPHSABS 1.8 04/16/2022 0759   MONOABS 0.5 11/04/2022 1218   EOSABS 0.3 11/04/2022 1218   EOSABS 0.2 04/16/2022 0759   BASOSABS 0.0 11/04/2022 1218   BASOSABS 0.0 04/16/2022 0759  Recent Labs    04/16/22 0759 11/04/22 1218  HGB 13.4 13.8    CMP     Component Value Date/Time   NA 140 11/04/2022 1218   NA 139 04/16/2022 0759   K 4.1 11/04/2022 1218   CL 103 11/04/2022 1218   CO2 28 11/04/2022 1218   GLUCOSE 83 11/04/2022 1218   BUN 9 11/04/2022 1218   BUN 10 04/16/2022 0759   CREATININE 0.81 11/04/2022 1218   CALCIUM 10.2 11/04/2022 1218   PROT 7.6 11/04/2022 1218   PROT 6.8 04/16/2022 0759   ALBUMIN 4.7 11/04/2022 1218   ALBUMIN 4.4 04/16/2022  0759   AST 14 11/04/2022 1218   ALT 11 11/04/2022 1218   ALKPHOS 70 11/04/2022 1218   BILITOT 0.3 11/04/2022 1218   BILITOT 0.3 04/16/2022 0759   GFRNONAA 98 03/15/2020 0759   GFRAA 113 03/15/2020 0759      Latest Ref Rng & Units 11/04/2022   12:18 PM 04/16/2022    7:59 AM 04/20/2021   11:01 AM  Hepatic Function  Total Protein 6.0 - 8.3 g/dL 7.6  6.8  6.6   Albumin 3.5 - 5.2 g/dL 4.7  4.4  4.3   AST 0 - 37 U/L 14  26  17    ALT 0 - 35 U/L 11  23  13    Alk Phosphatase 39 - 117 U/L 70  68  88   Total Bilirubin 0.2 - 1.2 mg/dL 0.3  0.3  <1.6       Current Medications:   Current Outpatient Medications (Endocrine & Metabolic):    levonorgestrel (MIRENA) 20 MCG/24HR IUD, 1 each by Intrauterine route once.   levothyroxine (SYNTHROID) 50 MCG tablet, Take 1 tablet (50 mcg total) by mouth daily before breakfast.    Current Outpatient Medications (Analgesics):    diclofenac (VOLTAREN) 75 MG EC tablet, Take 1 tablet (75 mg total) by mouth 2 (two) times daily.   Current Outpatient Medications (Other):    ALPRAZolam (XANAX) 0.5 MG tablet, Take 0.5 mg by mouth at bedtime as needed for anxiety.   dicyclomine (BENTYL) 10 MG capsule, Take 1 capsule (10 mg total) by mouth 2 (two) times daily. Take 30 minutes before breakfast and dinner   famotidine (PEPCID) 20 MG tablet, Take 1 tablet (20 mg total) by mouth 2 (two) times daily.   omeprazole (PRILOSEC) 40 MG capsule, Take 1 capsule (40 mg) by mouth 2 times daily.   sertraline (ZOLOFT) 100 MG tablet, Take 1 tablet (100 mg total) by mouth daily.   Wheat Dextrin (BENEFIBER) POWD, Take by mouth daily.   ALPRAZolam (XANAX) 0.5 MG tablet, Take 1 tablet (0.5 mg total) by mouth 2 (two) times daily. (Patient not taking: Reported on 02/25/2023)  Medical History:  Past Medical History:  Diagnosis Date   Anxiety    Arthritis    in neck   Asthma    GERD (gastroesophageal reflux disease)    OCD (obsessive compulsive disorder)    Thyroid disease    hypo    Allergies:  Allergies  Allergen Reactions   Codeine      Surgical History:  She  has a past surgical history that includes Tubal ligation; Tonsillectomy; Wisdom tooth extraction; and Rotator cuff repair (Left, 10/22/2021). Family History:  Her family history includes Breast cancer in her paternal grandmother; Cancer in her paternal grandmother; Crohn's disease in an other family member; Diabetes in her maternal grandfather; Heart disease in her paternal grandfather; Hypertension in her brother; Hyperthyroidism in her  mother; Liver disease in her father; Pancreatic cancer in her maternal uncle; Stroke in her maternal grandfather and paternal grandfather.  REVIEW OF SYSTEMS  : All other systems reviewed and negative except where noted in the History of Present Illness.  PHYSICAL EXAM: BP 102/68   Pulse 73   Ht 5\' 7"  (1.702 m)   Wt 159 lb 2 oz (72.2 kg)   SpO2 99%   BMI 24.92 kg/m  General Appearance: Well nourished, in no apparent distress. Head:   Normocephalic and atraumatic. Eyes:  sclerae anicteric,conjunctive pink  Respiratory: Respiratory effort normal, BS equal bilaterally without rales, rhonchi, wheezing. Cardio: RRR with no MRGs. Peripheral pulses intact.  Abdomen: Soft,  Non-distended ,active bowel sounds. mild tenderness in the epigastrium and in the RLQ. Negative murphy.  Without guarding and Without rebound. No masses. Rectal: Not evaluated Musculoskeletal: Full ROM, Normal gait. Without edema. Skin:  Dry and intact without significant lesions or rashes Neuro: Alert and  oriented x4;  No focal deficits. Psych:  Cooperative. Normal mood and affect.    Doree Albee, PA-C 9:37 AM

## 2023-02-25 ENCOUNTER — Ambulatory Visit: Payer: 59 | Admitting: Physician Assistant

## 2023-02-25 ENCOUNTER — Encounter: Payer: Self-pay | Admitting: Physician Assistant

## 2023-02-25 VITALS — BP 102/68 | HR 73 | Ht 67.0 in | Wt 159.1 lb

## 2023-02-25 DIAGNOSIS — R1011 Right upper quadrant pain: Secondary | ICD-10-CM

## 2023-02-25 DIAGNOSIS — K529 Noninfective gastroenteritis and colitis, unspecified: Secondary | ICD-10-CM

## 2023-02-25 DIAGNOSIS — R32 Unspecified urinary incontinence: Secondary | ICD-10-CM | POA: Diagnosis not present

## 2023-02-25 DIAGNOSIS — K2980 Duodenitis without bleeding: Secondary | ICD-10-CM

## 2023-02-25 DIAGNOSIS — K219 Gastro-esophageal reflux disease without esophagitis: Secondary | ICD-10-CM | POA: Diagnosis not present

## 2023-02-25 DIAGNOSIS — K2970 Gastritis, unspecified, without bleeding: Secondary | ICD-10-CM

## 2023-02-25 NOTE — Patient Instructions (Addendum)
Please follow up as needed.   Please take your proton pump inhibitor medication, prilosec increase to the 40 mg twice daily, can break apart, for 2 weeks and see how that does.  Please take this medication 30 minutes to 1 hour before meals- this makes it more effective.  Avoid spicy and acidic foods Avoid fatty foods Limit your intake of coffee, tea, alcohol, and carbonated drinks Work to maintain a healthy weight Keep the head of the bed elevated at least 3 inches with blocks or a wedge pillow if you are having any nighttime symptoms Stay upright for 2 hours after eating Avoid meals and snacks three to four hours before bedtime   If you keep having pain, message or call and we can get something called a HIDA or refer to general surgeon to evaluate your gallbladder.    Diverticulosis Diverticulosis is a condition that develops when small pouches (diverticula) form in the wall of the large intestine (colon). The colon is where water is absorbed and stool (feces) is formed. The pouches form when the inside layer of the colon pushes through weak spots in the outer layers of the colon. You may have a few pouches or many of them. The pouches usually do not cause problems unless they become inflamed or infected. When this happens, the condition is called diverticulitis- this is left lower quadrant pain, diarrhea, fever, chills, nausea or vomiting.  If this occurs please call the office or go to the hospital. Sometimes these patches without inflammation can also have painless bleeding associated with them, if this happens please call the office or go to the hospital. Preventing constipation and increasing fiber can help reduce diverticula and prevent complications. Even if you feel you have a high-fiber diet, suggest getting on Benefiber or Cirtracel 2 times daily.  Here some information about pelvic floor dysfunction. This may be contributing to some of your symptoms. We will continue with our  evaluation but I do want you to consider adding on fiber supplement with low-dose MiraLAX daily. We could also refer to pelvic floor physical therapy.   Pelvic Floor Dysfunction, Female Pelvic floor dysfunction (PFD) is a condition that results when the group of muscles and connective tissues that support the organs in the pelvis (pelvic floor muscles) do not work well. These muscles and their connections form a sling that supports the colon and bladder. In women, they also support the uterus. PFD causes pelvic floor muscles to be too weak, too tight, or both. In PFD, muscle movements are not coordinated. This may cause bowel or bladder problems. It may also cause pain. What are the causes? This condition may be caused by an injury to the pelvic area or by a weakening of pelvic muscles. This often results from pregnancy and childbirth or other types of strain. In many cases, the exact cause is not known. What increases the risk? The following factors may make you more likely to develop this condition: Having chronic bladder tissue inflammation (interstitial cystitis). Being an older person. Being overweight. History of radiation treatment for cancer in the pelvic region. Previous pelvic surgery, such as removal of the uterus (hysterectomy). What are the signs or symptoms? Symptoms of this condition vary and may include: Bladder symptoms, such as: Trouble starting urination and emptying the bladder. Frequent urinary tract infections. Leaking urine when coughing, laughing, or exercising (stress incontinence). Having to pass urine urgently or frequently. Pain when passing urine. Bowel symptoms, such as: Constipation. Urgent or frequent bowel movements.  Incomplete bowel movements. Painful bowel movements. Leaking stool or gas. Unexplained genital or rectal pain. Genital or rectal muscle spasms. Low back pain. Other symptoms may include: A heavy, full, or aching feeling in the vagina. A  bulge that protrudes into the vagina. Pain during or after sex. How is this diagnosed? This condition may be diagnosed based on: Your symptoms and medical history. A physical exam. During the exam, your health care provider may check your pelvic muscles for tightness, spasm, pain, or weakness. This may include a rectal exam and a pelvic exam. In some cases, you may have diagnostic tests, such as: Electrical muscle function tests. Urine flow testing. X-ray tests of bowel function. Ultrasound of the pelvic organs. How is this treated? Treatment for this condition depends on the symptoms. Treatment options include: Physical therapy. This may include Kegel exercises to help relax or strengthen the pelvic floor muscles. Biofeedback. This type of therapy provides feedback on how tight your pelvic floor muscles are so that you can learn to control them. Internal or external massage therapy. A treatment that involves electrical stimulation of the pelvic floor muscles to help control pain (transcutaneous electrical nerve stimulation, or TENS). Sound wave therapy (ultrasound) to reduce muscle spasms. Medicines, such as: Muscle relaxants. Bladder control medicines. Surgery to reconstruct or support pelvic floor muscles may be an option if other treatments do not help. Follow these instructions at home: Activity Do your usual activities as told by your health care provider. Ask your health care provider if you should modify any activities. Do pelvic floor strengthening or relaxing exercises at home as told by your physical therapist. Lifestyle Maintain a healthy weight. Eat foods that are high in fiber, such as beans, whole grains, and fresh fruits and vegetables. Limit foods that are high in fat and processed sugars, such as fried or sweet foods. Manage stress with relaxation techniques such as yoga or meditation. General instructions If you have problems with leakage: Use absorbable pads or  wear padded underwear. Wash frequently with mild soap. Keep your genital and anal area as clean and dry as possible. Ask your health care provider if you should try a barrier cream to prevent skin irritation. Take warm baths to relieve pelvic muscle tension or spasms. Take over-the-counter and prescription medicines only as told by your health care provider. Keep all follow-up visits. How is this prevented? The cause of PFD is not always known, but there are a few things you can do to reduce the risk of developing this condition, including: Staying at a healthy weight. Getting regular exercise. Managing stress. Contact a health care provider if: Your symptoms are not improving with home care. You have signs or symptoms of PFD that get worse at home. You develop new signs or symptoms. You have signs of a urinary tract infection, such as: Fever. Chills. Increased urinary frequency. A burning feeling when urinating. You have not had a bowel movement in 3 days (constipation). Summary Pelvic floor dysfunction results when the muscles and connective tissues in your pelvic floor do not work well. These muscles and their connections form a sling that supports your colon and bladder. In women, they also support the uterus. PFD may be caused by an injury to the pelvic area or by a weakening of pelvic muscles. PFD causes pelvic floor muscles to be too weak, too tight, or a combination of both. Symptoms may vary from person to person. In most cases, PFD can be treated with physical therapies and medicines.  Surgery may be an option if other treatments do not help. This information is not intended to replace advice given to you by your health care provider. Make sure you discuss any questions you have with your health care provider. Document Revised: 12/13/2020 Document Reviewed: 12/13/2020 Elsevier Patient Education  2022 [employer]  _______________________________________________________  If your blood pressure at your visit was 140/90 or greater, please contact your primary care physician to follow up on this.  _______________________________________________________  If you are age 24 or older, your body mass index should be between 23-30. Your Body mass index is 24.92 kg/m. If this is out of the aforementioned range listed, please consider follow up with your Primary Care Provider.  If you are age 22 or younger, your body mass index should be between 19-25. Your Body mass index is 24.92 kg/m. If this is out of the aformentioned range listed, please consider follow up with your Primary Care Provider.   ________________________________________________________  The  GI providers would like to encourage you to use [facility] to communicate with providers for non-urgent requests or questions.  Due to long hold times on the telephone, sending your provider a message by [facility] may be a faster and more efficient way to get a response.  Please allow 48 business hours for a response.  Please remember that this is for non-urgent requests.  _______________________________________________________  It was a pleasure to see you today!  Thank you for trusting me with your gastrointestinal care!

## 2023-02-25 NOTE — Progress Notes (Signed)
I agree with the assessment and plan as outlined by Ms. Collier. 

## 2023-03-18 ENCOUNTER — Other Ambulatory Visit (HOSPITAL_COMMUNITY): Payer: Self-pay

## 2023-03-18 ENCOUNTER — Other Ambulatory Visit: Payer: Self-pay | Admitting: Nurse Practitioner

## 2023-03-18 MED ORDER — FAMOTIDINE 20 MG PO TABS
20.0000 mg | ORAL_TABLET | Freq: Two times a day (BID) | ORAL | 0 refills | Status: DC
  Filled 2023-03-18: qty 60, 30d supply, fill #0

## 2023-03-18 MED ORDER — DICYCLOMINE HCL 10 MG PO CAPS
10.0000 mg | ORAL_CAPSULE | Freq: Two times a day (BID) | ORAL | 0 refills | Status: DC
  Filled 2023-03-18: qty 60, 30d supply, fill #0

## 2023-03-19 ENCOUNTER — Telehealth: Payer: Self-pay | Admitting: Physician Assistant

## 2023-03-19 ENCOUNTER — Other Ambulatory Visit (HOSPITAL_COMMUNITY): Payer: Self-pay

## 2023-03-19 ENCOUNTER — Other Ambulatory Visit: Payer: Self-pay

## 2023-03-19 DIAGNOSIS — R1011 Right upper quadrant pain: Secondary | ICD-10-CM

## 2023-03-19 NOTE — Telephone Encounter (Signed)
Pt called requesting to have a HIDA scan done due to abd pain. Per OV note if pt was still having pain HIDA scan could be ordered. Order placed. Pt given the phone number for rad scheduling to set up the appt.

## 2023-03-19 NOTE — Telephone Encounter (Signed)
Inbound call from patient stating she is having issues with her gallbladder and is wanting to discuss scheduling a HIDA scan. Please advise.

## 2023-04-14 ENCOUNTER — Encounter (HOSPITAL_COMMUNITY)
Admission: RE | Admit: 2023-04-14 | Discharge: 2023-04-14 | Disposition: A | Discharge status: Home / Self Care | Payer: 59 | Source: Ambulatory Visit | Attending: Physician Assistant | Admitting: Physician Assistant

## 2023-04-14 ENCOUNTER — Telehealth: Payer: Self-pay | Admitting: Family

## 2023-04-14 DIAGNOSIS — Z0000 Encounter for general adult medical examination without abnormal findings: Secondary | ICD-10-CM

## 2023-04-14 DIAGNOSIS — R1011 Right upper quadrant pain: Secondary | ICD-10-CM | POA: Diagnosis not present

## 2023-04-14 MED ORDER — TECHNETIUM TC 99M MEBROFENIN IV KIT
5.2900 | PACK | Freq: Once | INTRAVENOUS | Status: AC | PRN
  Administered 2023-04-14: 5.29 via INTRAVENOUS

## 2023-04-14 NOTE — Telephone Encounter (Signed)
Lab orders placed, patient aware 

## 2023-04-17 ENCOUNTER — Other Ambulatory Visit: Payer: 59

## 2023-04-18 ENCOUNTER — Other Ambulatory Visit (HOSPITAL_COMMUNITY): Payer: Self-pay

## 2023-04-18 ENCOUNTER — Encounter: Payer: Self-pay | Admitting: Family

## 2023-04-18 ENCOUNTER — Ambulatory Visit (INDEPENDENT_AMBULATORY_CARE_PROVIDER_SITE_OTHER): Payer: 59 | Admitting: Family

## 2023-04-18 VITALS — BP 92/63 | HR 75 | Temp 97.5°F | Ht 67.0 in | Wt 160.0 lb

## 2023-04-18 DIAGNOSIS — E785 Hyperlipidemia, unspecified: Secondary | ICD-10-CM | POA: Diagnosis not present

## 2023-04-18 DIAGNOSIS — E039 Hypothyroidism, unspecified: Secondary | ICD-10-CM

## 2023-04-18 DIAGNOSIS — G4700 Insomnia, unspecified: Secondary | ICD-10-CM | POA: Diagnosis not present

## 2023-04-18 DIAGNOSIS — Z0001 Encounter for general adult medical examination with abnormal findings: Secondary | ICD-10-CM | POA: Diagnosis not present

## 2023-04-18 DIAGNOSIS — E559 Vitamin D deficiency, unspecified: Secondary | ICD-10-CM | POA: Diagnosis not present

## 2023-04-18 DIAGNOSIS — Z0000 Encounter for general adult medical examination without abnormal findings: Secondary | ICD-10-CM

## 2023-04-18 DIAGNOSIS — K219 Gastro-esophageal reflux disease without esophagitis: Secondary | ICD-10-CM

## 2023-04-18 DIAGNOSIS — F1320 Sedative, hypnotic or anxiolytic dependence, uncomplicated: Secondary | ICD-10-CM

## 2023-04-18 DIAGNOSIS — Z79899 Other long term (current) drug therapy: Secondary | ICD-10-CM

## 2023-04-18 DIAGNOSIS — F411 Generalized anxiety disorder: Secondary | ICD-10-CM

## 2023-04-18 MED ORDER — ALPRAZOLAM 0.5 MG PO TABS
0.5000 mg | ORAL_TABLET | Freq: Two times a day (BID) | ORAL | 5 refills | Status: DC
Start: 2023-04-18 — End: 2024-04-13
  Filled 2023-04-18: qty 60, 30d supply, fill #0

## 2023-04-18 MED ORDER — TRAZODONE HCL 100 MG PO TABS
100.0000 mg | ORAL_TABLET | Freq: Every day | ORAL | 2 refills | Status: DC
Start: 2023-04-18 — End: 2024-04-13
  Filled 2023-04-18: qty 90, 90d supply, fill #0
  Filled 2024-01-13: qty 90, 90d supply, fill #1

## 2023-04-18 NOTE — Addendum Note (Signed)
Addended by: Ignacia Bayley on: 04/18/2023 08:25 AM   Modules accepted: Orders

## 2023-04-18 NOTE — Progress Notes (Addendum)
Subjective:    Patient ID: Katherine Hays, female    DOB: August 05, 1971, 52 y.o.   MRN: [id number]  Chief Complaint  Patient presents with   Annual Exam   Pt presents to the office today for CPE without pap. She is scheduled to have cholecystectomy. Gastroesophageal Reflux She complains of belching and heartburn. This is a chronic problem. The current episode started more than 1 year ago. The problem occurs frequently. Associated symptoms include fatigue. She has tried a PPI and an herbal remedy for the symptoms. The treatment provided moderate relief.  Thyroid Problem Presents for follow-up visit. Symptoms include anxiety, dry skin and fatigue. The symptoms have been stable. Her past medical history is significant for hyperlipidemia.  Insomnia Primary symptoms: fragmented sleep, difficulty falling asleep.   The current episode started more than one year. The onset quality is gradual. The problem occurs intermittently. Past treatments include medication. The treatment provided moderate relief.  Hyperlipidemia This is a chronic problem. The current episode started more than 1 year ago. Current antihyperlipidemic treatment includes diet change. The current treatment provides mild improvement of lipids. Risk factors for coronary artery disease include dyslipidemia, hypertension, a sedentary lifestyle and post-menopausal.  Anxiety Presents for follow-up visit. Symptoms include excessive worry, insomnia, irritability, nervous/anxious behavior and restlessness. Symptoms occur occasionally. The severity of symptoms is moderate.        Review of Systems  Constitutional:  Positive for fatigue and irritability.  Gastrointestinal:  Positive for heartburn.  Psychiatric/Behavioral:  The patient is nervous/anxious and has insomnia.   All other systems reviewed and are negative.   Family History  Problem Relation Age of Onset   Hyperthyroidism Mother    Liver disease Father        cirrhosis    Hypertension Brother    Diabetes Maternal Grandfather    Stroke Maternal Grandfather    Breast cancer Paternal Grandmother    Cancer Paternal Grandmother        breast   Heart disease Paternal Grandfather    Stroke Paternal Grandfather    Pancreatic cancer Maternal Uncle    Crohn's disease Other        Nephew   Colon cancer Neg Hx    Stomach cancer Neg Hx    Esophageal cancer Neg Hx    Social History   Socioeconomic History   Marital status: Married    Spouse name: Not on file   Number of children: 3   Years of education: Not on file   Highest education level: Not on file  Occupational History   Occupation: Forensic scientist: [employer]  Tobacco Use   Smoking status: Never   Smokeless tobacco: Never  Vaping Use   Vaping status: Never Used  Substance and Sexual Activity   Alcohol use: No   Drug use: No   Sexual activity: Yes  Other Topics Concern   Not on file  Social History Narrative   Not on file   Social Determinants of Health   Financial Resource Strain: Not on file  Food Insecurity: Not on file  Transportation Needs: Not on file  Physical Activity: Not on file  Stress: Not on file  Social Connections: Not on file       Objective:   Physical Exam Vitals reviewed.  Constitutional:      General: She is not in acute distress.    Appearance: She is well-developed.  HENT:     Head: Normocephalic and atraumatic.  Right Ear: Tympanic membrane normal.     Left Ear: Tympanic membrane normal.  Eyes:     Pupils: Pupils are equal, round, and reactive to light.  Neck:     Thyroid: No thyromegaly.  Cardiovascular:     Rate and Rhythm: Normal rate and regular rhythm.     Heart sounds: Normal heart sounds. No murmur heard. Pulmonary:     Effort: Pulmonary effort is normal. No respiratory distress.     Breath sounds: Normal breath sounds. No wheezing.  Abdominal:     General: Bowel sounds are normal. There is no distension.     Palpations:  Abdomen is soft.     Tenderness: There is abdominal tenderness (RUQ tenderness).  Musculoskeletal:        General: No tenderness. Normal range of motion.     Cervical back: Normal range of motion and neck supple.  Skin:    General: Skin is warm and dry.  Neurological:     Mental Status: She is alert and oriented to person, place, and time.     Cranial Nerves: No cranial nerve deficit.     Deep Tendon Reflexes: Reflexes are normal and symmetric.  Psychiatric:        Behavior: Behavior normal.        Thought Content: Thought content normal.        Judgment: Judgment normal.       BP 92/63   Pulse 75   Temp (!) 97.5 F (36.4 C) (Temporal)   Ht 5\' 7"  (1.702 m)   Wt 160 lb (72.6 kg)   SpO2 99%   BMI 25.06 kg/m      Assessment & Plan:  Katherine Hays comes in today with chief complaint of Annual Exam   Diagnosis and orders addressed:  1. Benzodiazepine dependence (HCC) - ALPRAZolam (XANAX) 0.5 MG tablet; Take 1 tablet (0.5 mg total) by mouth 2 (two) times daily.  Dispense: 60 tablet; Refill: 5 - CBC with Differential/Platelet - CMP14+EGFR  2. Controlled substance agreement signed - ALPRAZolam (XANAX) 0.5 MG tablet; Take 1 tablet (0.5 mg total) by mouth 2 (two) times daily.  Dispense: 60 tablet; Refill: 5 - CBC with Differential/Platelet - CMP14+EGFR  3. GAD (generalized anxiety disorder) - ALPRAZolam (XANAX) 0.5 MG tablet; Take 1 tablet (0.5 mg total) by mouth 2 (two) times daily.  Dispense: 60 tablet; Refill: 5 - CBC with Differential/Platelet - CMP14+EGFR  4. Annual physical exam - CBC with Differential/Platelet - CMP14+EGFR - Lipid panel - TSH - VITAMIN D 25 Hydroxy (Vit-D Deficiency, Fractures)  5. Vitamin D deficiency - CBC with Differential/Platelet - CMP14+EGFR - VITAMIN D 25 Hydroxy (Vit-D Deficiency, Fractures)  6. Insomnia, unspecified type - traZODone (DESYREL) 100 MG tablet; Take 1 tablet (100 mg total) by mouth at bedtime.  Dispense: 90  tablet; Refill: 2 - CBC with Differential/Platelet - CMP14+EGFR  7. Hypothyroidism, unspecified type - CBC with Differential/Platelet - CMP14+EGFR - TSH  8. Hyperlipidemia, unspecified hyperlipidemia type - CBC with Differential/Platelet - CMP14+EGFR - Lipid panel  9. Gastroesophageal reflux disease, unspecified whether esophagitis present - CBC with Differential/Platelet - CMP14+EGFR   Labs pending Patient reviewed in [place] controlled database, no flags noted. Contract and drug screen are up to date.  Health Maintenance reviewed Diet and exercise encouraged  Follow up plan: 6 months    Jannifer Rodney, FNP

## 2023-04-18 NOTE — Patient Instructions (Signed)
Insomnia Insomnia is a sleep disorder that makes it difficult to fall asleep or stay asleep. Insomnia can cause fatigue, low energy, difficulty concentrating, mood swings, and poor performance at work or school. There are three different ways to classify insomnia: Difficulty falling asleep. Difficulty staying asleep. Waking up too early in the morning. Any type of insomnia can be long-term (chronic) or short-term (acute). Both are common. Short-term insomnia usually lasts for 3 months or less. Chronic insomnia occurs at least three times a week for longer than 3 months. What are the causes? Insomnia may be caused by another condition, situation, or substance, such as: Having certain mental health conditions, such as anxiety and depression. Using caffeine, alcohol, tobacco, or drugs. Having gastrointestinal conditions, such as gastroesophageal reflux disease (GERD). Having certain medical conditions. These include: Asthma. Alzheimer's disease. Stroke. Chronic pain. An overactive thyroid gland (hyperthyroidism). Other sleep disorders, such as restless legs syndrome and sleep apnea. Menopause. Sometimes, the cause of insomnia may not be known. What increases the risk? Risk factors for insomnia include: Gender. Females are affected more often than males. Age. Insomnia is more common as people get older. Stress and certain medical and mental health conditions. Lack of exercise. Having an irregular work schedule. This may include working night shifts and traveling between different time zones. What are the signs or symptoms? If you have insomnia, the main symptom is having trouble falling asleep or having trouble staying asleep. This may lead to other symptoms, such as: Feeling tired or having low energy. Feeling nervous about going to sleep. Not feeling rested in the morning. Having trouble concentrating. Feeling irritable, anxious, or depressed. How is this diagnosed? This condition  may be diagnosed based on: Your symptoms and medical history. Your health care provider may ask about: Your sleep habits. Any medical conditions you have. Your mental health. A physical exam. How is this treated? Treatment for insomnia depends on the cause. Treatment may focus on treating an underlying condition that is causing the insomnia. Treatment may also include: Medicines to help you sleep. Counseling or therapy. Lifestyle adjustments to help you sleep better. Follow these instructions at home: Eating and drinking  Limit or avoid alcohol, caffeinated beverages, and products that contain nicotine and tobacco, especially close to bedtime. These can disrupt your sleep. Do not eat a large meal or eat spicy foods right before bedtime. This can lead to digestive discomfort that can make it hard for you to sleep. Sleep habits  Keep a sleep diary to help you and your health care provider figure out what could be causing your insomnia. Write down: When you sleep. When you wake up during the night. How well you sleep and how rested you feel the next day. Any side effects of medicines you are taking. What you eat and drink. Make your bedroom a dark, comfortable place where it is easy to fall asleep. Put up shades or blackout curtains to block light from outside. Use a white noise machine to block noise. Keep the temperature cool. Limit screen use before bedtime. This includes: Not watching TV. Not using your smartphone, tablet, or computer. Stick to a routine that includes going to bed and waking up at the same times every day and night. This can help you fall asleep faster. Consider making a quiet activity, such as reading, part of your nighttime routine. Try to avoid taking naps during the day so that you sleep better at night. Get out of bed if you are still awake after   15 minutes of trying to sleep. Keep the lights down, but try reading or doing a quiet activity. When you feel  sleepy, go back to bed. General instructions Take over-the-counter and prescription medicines only as told by your health care provider. Exercise regularly as told by your health care provider. However, avoid exercising in the hours right before bedtime. Use relaxation techniques to manage stress. Ask your health care provider to suggest some techniques that may work well for you. These may include: Breathing exercises. Routines to release muscle tension. Visualizing peaceful scenes. Make sure that you drive carefully. Do not drive if you feel very sleepy. Keep all follow-up visits. This is important. Contact a health care provider if: You are tired throughout the day. You have trouble in your daily routine due to sleepiness. You continue to have sleep problems, or your sleep problems get worse. Get help right away if: You have thoughts about hurting yourself or someone else. Get help right away if you feel like you may hurt yourself or others, or have thoughts about taking your own life. Go to your nearest emergency room or: Call 911. Call the National Suicide Prevention Lifeline at 1-800-273-8255 or 988. This is open 24 hours a day. Text the Crisis Text Line at 741741. Summary Insomnia is a sleep disorder that makes it difficult to fall asleep or stay asleep. Insomnia can be long-term (chronic) or short-term (acute). Treatment for insomnia depends on the cause. Treatment may focus on treating an underlying condition that is causing the insomnia. Keep a sleep diary to help you and your health care provider figure out what could be causing your insomnia. This information is not intended to replace advice given to you by your health care provider. Make sure you discuss any questions you have with your health care provider. Document Revised: 07/16/2021 Document Reviewed: 07/16/2021 Elsevier Patient Education  2024 [employer] Inc.  

## 2023-04-19 LAB — CBC WITH DIFFERENTIAL/PLATELET
Basophils Absolute: 0.1 10*3/uL (ref 0.0–0.2)
Basos: 1 %
EOS (ABSOLUTE): 0.2 10*3/uL (ref 0.0–0.4)
Eos: 4 %
Hematocrit: 37.3 % (ref 34.0–46.6)
Hemoglobin: 12.4 g/dL (ref 11.1–15.9)
Immature Grans (Abs): 0 10*3/uL (ref 0.0–0.1)
Immature Granulocytes: 0 %
Lymphocytes Absolute: 1.6 10*3/uL (ref 0.7–3.1)
Lymphs: 31 %
MCH: 28.9 pg (ref 26.6–33.0)
MCHC: 33.2 g/dL (ref 31.5–35.7)
MCV: 87 fL (ref 79–97)
Monocytes Absolute: 0.4 10*3/uL (ref 0.1–0.9)
Monocytes: 8 %
Neutrophils Absolute: 2.8 10*3/uL (ref 1.4–7.0)
Neutrophils: 56 %
Platelets: 340 10*3/uL (ref 150–450)
RBC: 4.29 x10E6/uL (ref 3.77–5.28)
RDW: 12 % (ref 11.7–15.4)
WBC: 5.1 10*3/uL (ref 3.4–10.8)

## 2023-04-19 LAB — CMP14+EGFR
ALT: 12 IU/L (ref 0–32)
AST: 18 IU/L (ref 0–40)
Albumin: 4.4 g/dL (ref 3.8–4.9)
Alkaline Phosphatase: 76 IU/L (ref 44–121)
BUN/Creatinine Ratio: 12 (ref 9–23)
BUN: 9 mg/dL (ref 6–24)
Bilirubin Total: 0.3 mg/dL (ref 0.0–1.2)
CO2: 24 mmol/L (ref 20–29)
Calcium: 9.3 mg/dL (ref 8.7–10.2)
Chloride: 105 mmol/L (ref 96–106)
Creatinine, Ser: 0.76 mg/dL (ref 0.57–1.00)
Globulin, Total: 2.4 g/dL (ref 1.5–4.5)
Glucose: 93 mg/dL (ref 70–99)
Potassium: 4.7 mmol/L (ref 3.5–5.2)
Sodium: 142 mmol/L (ref 134–144)
Total Protein: 6.8 g/dL (ref 6.0–8.5)
eGFR: 93 mL/min/{1.73_m2} (ref >59)

## 2023-04-19 LAB — LIPID PANEL
Chol/HDL Ratio: 3.9 ratio (ref 0.0–4.4)
Cholesterol, Total: 180 mg/dL (ref 100–199)
HDL: 46 mg/dL (ref >39)
LDL Chol Calc (NIH): 118 mg/dL — ABNORMAL HIGH (ref 0–99)
Triglycerides: 84 mg/dL (ref 0–149)
VLDL Cholesterol Cal: 16 mg/dL (ref 5–40)

## 2023-04-19 LAB — TSH: TSH: 2.72 u[IU]/mL (ref 0.450–4.500)

## 2023-04-19 LAB — VITAMIN D 25 HYDROXY (VIT D DEFICIENCY, FRACTURES): Vit D, 25-Hydroxy: 34.4 ng/mL (ref 30.0–100.0)

## 2023-04-22 ENCOUNTER — Other Ambulatory Visit: Payer: 59

## 2023-04-24 LAB — TOXASSURE SELECT 13 (MW), URINE

## 2023-04-29 ENCOUNTER — Ambulatory Visit: Payer: 59

## 2023-05-02 DIAGNOSIS — K824 Cholesterolosis of gallbladder: Secondary | ICD-10-CM | POA: Diagnosis not present

## 2023-05-02 DIAGNOSIS — K828 Other specified diseases of gallbladder: Secondary | ICD-10-CM | POA: Diagnosis not present

## 2023-05-09 ENCOUNTER — Ambulatory Visit: Payer: Self-pay | Admitting: Surgery

## 2023-05-09 NOTE — Progress Notes (Signed)
Sent message, via epic in basket, requesting orders in epic from surgeon.  

## 2023-05-12 NOTE — Patient Instructions (Signed)
SURGICAL WAITING ROOM VISITATION  Patients having surgery or a procedure may have no more than 2 support people in the waiting area - these visitors may rotate.    Children under the age of 14 must have an adult with them who is not the patient.  Due to an increase in RSV and influenza rates and associated hospitalizations, children ages 38 and under may not visit patients in [facility] hospitals.  If the patient needs to stay at the hospital during part of their recovery, the visitor guidelines for inpatient rooms apply. Pre-op nurse will coordinate an appropriate time for 1 support person to accompany patient in pre-op.  This support person may not rotate.    Please refer to the [facility] website for the visitor guidelines for Inpatients (after your surgery is over and you are in a regular room).    Your procedure is scheduled on: 05/19/23   Report to [facility] Main Entrance    Report to admitting at 12:45 PM   Call this number if you have problems the morning of surgery [phone]   Do not eat food :After Midnight.   After Midnight you may have the following liquids until 12:00 PM DAY OF SURGERY  Water Non-Citrus Juices (without pulp, NO RED-Apple, White grape, White cranberry) Black Coffee (NO MILK/CREAM OR CREAMERS, sugar ok)  Clear Tea (NO MILK/CREAM OR CREAMERS, sugar ok) regular and decaf                             Plain Jell-O (NO RED)                                           Fruit ices (not with fruit pulp, NO RED)                                     Popsicles (NO RED)                                                               Sports drinks like Gatorade (NO RED)                     If you have questions, please contact your surgeon's office.   FOLLOW BOWEL PREP AND ANY ADDITIONAL PRE OP INSTRUCTIONS YOU RECEIVED FROM YOUR SURGEON'S OFFICE!!!     Oral Hygiene is also important to reduce your risk of infection.                                     Remember - BRUSH YOUR TEETH THE MORNING OF SURGERY WITH YOUR REGULAR TOOTHPASTE  DENTURES WILL BE REMOVED PRIOR TO SURGERY PLEASE DO NOT APPLY "Poly grip" OR ADHESIVES!!!   Stop all vitamins and herbal supplements 7 days before surgery.   Take these medicines the morning of surgery with A SIP OF WATER: Famotidine, Levothyroxine, Omeprazole, Sertraline  You may not have any metal on your body including hair pins, jewelry, and body piercing             Do not wear make-up, lotions, powders, perfumes, or deodorant  Do not wear nail polish including gel and S&S, artificial/acrylic nails, or any other type of covering on natural nails including finger and toenails. If you have artificial nails, gel coating, etc. that needs to be removed by a nail salon please have this removed prior to surgery or surgery may need to be canceled/ delayed if the surgeon/ anesthesia feels like they are unable to be safely monitored.   Do not shave  48 hours prior to surgery.    Do not bring valuables to the hospital. [facility] IS NOT             RESPONSIBLE   FOR VALUABLES.   Contacts, glasses, dentures or bridgework may not be worn into surgery.  DO NOT BRING YOUR HOME MEDICATIONS TO THE HOSPITAL. PHARMACY WILL DISPENSE MEDICATIONS LISTED ON YOUR MEDICATION LIST TO YOU DURING YOUR ADMISSION IN THE HOSPITAL!    Patients discharged on the day of surgery will not be allowed to drive home.  Someone NEEDS to stay with you for the first 24 hours after anesthesia.              Please read over the following fact sheets you were given: IF YOU HAVE QUESTIONS ABOUT YOUR PRE-OP INSTRUCTIONS PLEASE CALL [name]    If you received a COVID test during your pre-op visit  it is requested that you wear a mask when out in public, stay away from anyone that may not be feeling well and notify your surgeon if you develop symptoms. If you test positive for Covid or have been in contact  with anyone that has tested positive in the last 10 days please notify you surgeon.    Katherine Hays - Preparing for Surgery Before surgery, you can play an important role.  Because skin is not sterile, your skin needs to be as free of germs as possible.  You can reduce the number of germs on your skin by washing with CHG (chlorahexidine gluconate) soap before surgery.  CHG is an antiseptic cleaner which kills germs and bonds with the skin to continue killing germs even after washing. Please DO NOT use if you have an allergy to CHG or antibacterial soaps.  If your skin becomes reddened/irritated stop using the CHG and inform your nurse when you arrive at Short Stay. Do not shave (including legs and underarms) for at least 48 hours prior to the first CHG shower.  You may shave your face/neck.  Please follow these instructions carefully:  1.  Shower with CHG Soap the night before surgery and the  morning of surgery.  2.  If you choose to wash your hair, wash your hair first as usual with your normal  shampoo.  3.  After you shampoo, rinse your hair and body thoroughly to remove the shampoo.                             4.  Use CHG as you would any other liquid soap.  You can apply chg directly to the skin and wash.  Gently with a scrungie or clean washcloth.  5.  Apply the CHG Soap to your body ONLY FROM THE NECK DOWN.   Do   not use on  face/ open                           Wound or open sores. Avoid contact with eyes, ears mouth and   genitals (private parts).                       Wash face,  Genitals (private parts) with your normal soap.             6.  Wash thoroughly, paying special attention to the area where your    surgery  will be performed.  7.  Thoroughly rinse your body with warm water from the neck down.  8.  DO NOT shower/wash with your normal soap after using and rinsing off the CHG Soap.                9.  Pat yourself dry with a clean towel.            10.  Wear clean pajamas.             11.  Place clean sheets on your bed the night of your first shower and do not  sleep with pets. Day of Surgery : Do not apply any lotions/deodorants the morning of surgery.  Please wear clean clothes to the hospital/surgery center.  FAILURE TO FOLLOW THESE INSTRUCTIONS MAY RESULT IN THE CANCELLATION OF YOUR SURGERY  PATIENT SIGNATURE_________________________________  NURSE SIGNATURE__________________________________  ________________________________________________________________________

## 2023-05-12 NOTE — Progress Notes (Signed)
COVID Vaccine Completed: yes  Date of COVID positive in last 90 days:  PCP - Jannifer Rodney, FNP Cardiologist - Dina Rich, MD LOV 2016 for chest pain  Chest x-ray -  EKG -  Stress Test -  ECHO - 12/30/14 Epic  Cardiac Cath -  Pacemaker/ICD device last checked: Spinal Cord Stimulator:  Bowel Prep -   Sleep Study -  CPAP -   Fasting Blood Sugar -  Checks Blood Sugar _____ times a day  Last dose of GLP1 agonist-  N/A GLP1 instructions:  N/A   Last dose of SGLT-2 inhibitors-  N/A SGLT-2 instructions: N/A   Blood Thinner Instructions:  Time Aspirin Instructions: Last Dose:  Activity level:  Can go up a flight of stairs and perform activities of daily living without stopping and without symptoms of chest pain or shortness of breath.  Able to exercise without symptoms  Unable to go up a flight of stairs without symptoms of     Anesthesia review:   Patient denies shortness of breath, fever, cough and chest pain at PAT appointment  Patient verbalized understanding of instructions that were given to them at the PAT appointment. Patient was also instructed that they will need to review over the PAT instructions again at home before surgery.

## 2023-05-13 ENCOUNTER — Encounter (HOSPITAL_COMMUNITY): Payer: Self-pay

## 2023-05-13 ENCOUNTER — Encounter (HOSPITAL_COMMUNITY)
Admission: RE | Admit: 2023-05-13 | Discharge: 2023-05-13 | Disposition: A | Discharge status: Home / Self Care | Payer: 59 | Source: Ambulatory Visit | Attending: Surgery | Admitting: Surgery

## 2023-05-13 ENCOUNTER — Other Ambulatory Visit: Payer: Self-pay

## 2023-05-13 VITALS — BP 110/57 | HR 78 | Temp 98.6°F | Resp 18 | Ht 67.0 in | Wt 160.0 lb

## 2023-05-13 DIAGNOSIS — Z01812 Encounter for preprocedural laboratory examination: Secondary | ICD-10-CM | POA: Insufficient documentation

## 2023-05-13 DIAGNOSIS — Z01818 Encounter for other preprocedural examination: Secondary | ICD-10-CM | POA: Diagnosis present

## 2023-05-13 HISTORY — DX: Headache, unspecified: R51.9

## 2023-05-13 HISTORY — DX: Nausea with vomiting, unspecified: R11.2

## 2023-05-13 HISTORY — DX: Neuralgia and neuritis, unspecified: M79.2

## 2023-05-13 HISTORY — DX: Other specified postprocedural states: Z98.890

## 2023-05-13 LAB — CBC
HCT: 37.9 % (ref 36.0–46.0)
Hemoglobin: 12 g/dL (ref 12.0–15.0)
MCH: 28.6 pg (ref 26.0–34.0)
MCHC: 31.7 g/dL (ref 30.0–36.0)
MCV: 90.2 fL (ref 80.0–100.0)
Platelets: 314 10*3/uL (ref 150–400)
RBC: 4.2 MIL/uL (ref 3.87–5.11)
RDW: 12.5 % (ref 11.5–15.5)
WBC: 6.1 10*3/uL (ref 4.0–10.5)
nRBC: 0 % (ref 0.0–0.2)

## 2023-05-19 ENCOUNTER — Ambulatory Visit (HOSPITAL_BASED_OUTPATIENT_CLINIC_OR_DEPARTMENT_OTHER): Payer: 59 | Admitting: Registered Nurse

## 2023-05-19 ENCOUNTER — Other Ambulatory Visit: Payer: Self-pay

## 2023-05-19 ENCOUNTER — Ambulatory Visit (HOSPITAL_COMMUNITY)
Admission: RE | Admit: 2023-05-19 | Discharge: 2023-05-19 | Disposition: A | Discharge status: Home / Self Care | Payer: 59 | Attending: Surgery | Admitting: Surgery

## 2023-05-19 ENCOUNTER — Encounter (HOSPITAL_COMMUNITY)
Admission: RE | Disposition: A | Discharge status: Home / Self Care | Payer: Self-pay | Source: Home / Self Care | Attending: Surgery

## 2023-05-19 ENCOUNTER — Encounter (HOSPITAL_COMMUNITY): Payer: Self-pay | Admitting: Surgery

## 2023-05-19 ENCOUNTER — Ambulatory Visit (HOSPITAL_COMMUNITY): Payer: 59 | Admitting: Registered Nurse

## 2023-05-19 DIAGNOSIS — R109 Unspecified abdominal pain: Secondary | ICD-10-CM | POA: Diagnosis present

## 2023-05-19 DIAGNOSIS — K8050 Calculus of bile duct without cholangitis or cholecystitis without obstruction: Secondary | ICD-10-CM

## 2023-05-19 DIAGNOSIS — K828 Other specified diseases of gallbladder: Secondary | ICD-10-CM | POA: Diagnosis not present

## 2023-05-19 DIAGNOSIS — K219 Gastro-esophageal reflux disease without esophagitis: Secondary | ICD-10-CM | POA: Insufficient documentation

## 2023-05-19 DIAGNOSIS — J45909 Unspecified asthma, uncomplicated: Secondary | ICD-10-CM | POA: Insufficient documentation

## 2023-05-19 HISTORY — PX: CHOLECYSTECTOMY: SHX55

## 2023-05-19 LAB — POCT PREGNANCY, URINE: Preg Test, Ur: NEGATIVE

## 2023-05-19 SURGERY — LAPAROSCOPIC CHOLECYSTECTOMY
Anesthesia: General

## 2023-05-19 MED ORDER — CHLORHEXIDINE GLUCONATE CLOTH 2 % EX PADS: 6.0000 | MEDICATED_PAD | Freq: Once | CUTANEOUS | Status: DC

## 2023-05-19 MED ORDER — DEXAMETHASONE SODIUM PHOSPHATE 10 MG/ML IJ SOLN
INTRAMUSCULAR | Status: DC | PRN
  Administered 2023-05-19: 8 mg via INTRAVENOUS

## 2023-05-19 MED ORDER — ONDANSETRON HCL 4 MG/2ML IJ SOLN
INTRAMUSCULAR | Status: DC | PRN
  Administered 2023-05-19: 4 mg via INTRAVENOUS

## 2023-05-19 MED ORDER — SCOPOLAMINE 1 MG/3DAYS TD PT72
1.0000 | MEDICATED_PATCH | TRANSDERMAL | Status: DC
  Administered 2023-05-19: 1.5 mg via TRANSDERMAL
  Filled 2023-05-19: qty 1

## 2023-05-19 MED ORDER — MIDAZOLAM HCL 2 MG/2ML IJ SOLN
INTRAMUSCULAR | Status: AC
  Filled 2023-05-19: qty 2

## 2023-05-19 MED ORDER — FENTANYL CITRATE PF 50 MCG/ML IJ SOSY
PREFILLED_SYRINGE | INTRAMUSCULAR | Status: AC
  Filled 2023-05-19: qty 1

## 2023-05-19 MED ORDER — 0.9 % SODIUM CHLORIDE (POUR BTL) OPTIME
TOPICAL | Status: DC | PRN
Start: 2023-05-19 — End: 2023-05-19
  Administered 2023-05-19: 1000 mL

## 2023-05-19 MED ORDER — OXYCODONE-ACETAMINOPHEN 5-325 MG PO TABS
1.0000 | ORAL_TABLET | ORAL | 0 refills | Status: DC | PRN
Start: 2023-05-19 — End: 2023-12-16

## 2023-05-19 MED ORDER — FENTANYL CITRATE (PF) 100 MCG/2ML IJ SOLN
INTRAMUSCULAR | Status: AC
  Filled 2023-05-19: qty 2

## 2023-05-19 MED ORDER — DEXAMETHASONE SODIUM PHOSPHATE 10 MG/ML IJ SOLN
INTRAMUSCULAR | Status: AC
  Filled 2023-05-19: qty 1

## 2023-05-19 MED ORDER — ROCURONIUM BROMIDE 10 MG/ML (PF) SYRINGE
PREFILLED_SYRINGE | INTRAVENOUS | Status: DC | PRN
  Administered 2023-05-19: 40 mg via INTRAVENOUS

## 2023-05-19 MED ORDER — PROPOFOL 10 MG/ML IV BOLUS
INTRAVENOUS | Status: AC
  Filled 2023-05-19: qty 20

## 2023-05-19 MED ORDER — BUPIVACAINE HCL (PF) 0.25 % IJ SOLN
INTRAMUSCULAR | Status: DC | PRN
Start: 2023-05-19 — End: 2023-05-19
  Administered 2023-05-19: 30 mL

## 2023-05-19 MED ORDER — SUGAMMADEX SODIUM 200 MG/2ML IV SOLN
INTRAVENOUS | Status: DC | PRN
  Administered 2023-05-19: 200 mg via INTRAVENOUS

## 2023-05-19 MED ORDER — LACTATED RINGERS IR SOLN
Status: DC | PRN
Start: 2023-05-19 — End: 2023-05-19
  Administered 2023-05-19: 1000 mL

## 2023-05-19 MED ORDER — ROCURONIUM BROMIDE 10 MG/ML (PF) SYRINGE
PREFILLED_SYRINGE | INTRAVENOUS | Status: AC
  Filled 2023-05-19: qty 10

## 2023-05-19 MED ORDER — OXYCODONE HCL 5 MG/5ML PO SOLN
ORAL | Status: AC
  Filled 2023-05-19: qty 5

## 2023-05-19 MED ORDER — MIDAZOLAM HCL 5 MG/5ML IJ SOLN
INTRAMUSCULAR | Status: DC | PRN
  Administered 2023-05-19: 2 mg via INTRAVENOUS

## 2023-05-19 MED ORDER — LIDOCAINE 2% (20 MG/ML) 5 ML SYRINGE
INTRAMUSCULAR | Status: DC | PRN
  Administered 2023-05-19: 60 mg via INTRAVENOUS

## 2023-05-19 MED ORDER — ORAL CARE MOUTH RINSE: 15.0000 mL | Freq: Once | OROMUCOSAL | Status: AC

## 2023-05-19 MED ORDER — KETOROLAC TROMETHAMINE 15 MG/ML IJ SOLN
15.0000 mg | INTRAMUSCULAR | Status: AC
  Administered 2023-05-19: 15 mg via INTRAVENOUS
  Filled 2023-05-19: qty 1

## 2023-05-19 MED ORDER — LIDOCAINE HCL (PF) 2 % IJ SOLN
INTRAMUSCULAR | Status: AC
  Filled 2023-05-19: qty 5

## 2023-05-19 MED ORDER — FENTANYL CITRATE (PF) 100 MCG/2ML IJ SOLN
INTRAMUSCULAR | Status: DC | PRN
  Administered 2023-05-19 (×2): 50 ug via INTRAVENOUS

## 2023-05-19 MED ORDER — ONDANSETRON HCL 4 MG/2ML IJ SOLN
INTRAMUSCULAR | Status: AC
  Filled 2023-05-19: qty 2

## 2023-05-19 MED ORDER — OXYCODONE HCL 5 MG/5ML PO SOLN
5.0000 mg | Freq: Once | ORAL | Status: AC | PRN
  Administered 2023-05-19: 5 mg via ORAL

## 2023-05-19 MED ORDER — AMISULPRIDE (ANTIEMETIC) 5 MG/2ML IV SOLN
10.0000 mg | Freq: Once | INTRAVENOUS | Status: AC
  Administered 2023-05-19: 10 mg via INTRAVENOUS

## 2023-05-19 MED ORDER — PROPOFOL 500 MG/50ML IV EMUL
INTRAVENOUS | Status: DC | PRN
Start: 2023-05-19 — End: 2023-05-19
  Administered 2023-05-19: 120 ug/kg/min via INTRAVENOUS

## 2023-05-19 MED ORDER — LACTATED RINGERS IV SOLN: INTRAVENOUS | Status: DC

## 2023-05-19 MED ORDER — AMISULPRIDE (ANTIEMETIC) 5 MG/2ML IV SOLN
INTRAVENOUS | Status: AC
  Filled 2023-05-19: qty 4

## 2023-05-19 MED ORDER — PROPOFOL 10 MG/ML IV BOLUS
INTRAVENOUS | Status: DC | PRN
  Administered 2023-05-19: 120 mg via INTRAVENOUS

## 2023-05-19 MED ORDER — GABAPENTIN 300 MG PO CAPS
300.0000 mg | ORAL_CAPSULE | ORAL | Status: AC
  Administered 2023-05-19: 300 mg via ORAL
  Filled 2023-05-19: qty 1

## 2023-05-19 MED ORDER — OXYCODONE HCL 5 MG PO TABS: 5.0000 mg | ORAL_TABLET | Freq: Once | ORAL | Status: AC | PRN

## 2023-05-19 MED ORDER — FENTANYL CITRATE PF 50 MCG/ML IJ SOSY
25.0000 ug | PREFILLED_SYRINGE | INTRAMUSCULAR | Status: DC | PRN
  Administered 2023-05-19 (×3): 50 ug via INTRAVENOUS

## 2023-05-19 MED ORDER — CHLORHEXIDINE GLUCONATE 0.12 % MT SOLN
15.0000 mL | Freq: Once | OROMUCOSAL | Status: AC
  Administered 2023-05-19: 15 mL via OROMUCOSAL

## 2023-05-19 MED ORDER — CEFAZOLIN SODIUM-DEXTROSE 2-4 GM/100ML-% IV SOLN
2.0000 g | INTRAVENOUS | Status: AC
  Administered 2023-05-19: 2 g via INTRAVENOUS
  Filled 2023-05-19: qty 100

## 2023-05-19 MED ORDER — FENTANYL CITRATE PF 50 MCG/ML IJ SOSY
PREFILLED_SYRINGE | INTRAMUSCULAR | Status: AC
  Filled 2023-05-19: qty 2

## 2023-05-19 MED ORDER — BUPIVACAINE HCL (PF) 0.25 % IJ SOLN
INTRAMUSCULAR | Status: AC
  Filled 2023-05-19: qty 30

## 2023-05-19 MED ORDER — ACETAMINOPHEN 500 MG PO TABS
1000.0000 mg | ORAL_TABLET | ORAL | Status: AC
  Administered 2023-05-19: 1000 mg via ORAL
  Filled 2023-05-19: qty 2

## 2023-05-19 MED ORDER — PROPOFOL 1000 MG/100ML IV EMUL
INTRAVENOUS | Status: AC
  Filled 2023-05-19: qty 100

## 2023-05-19 SURGICAL SUPPLY — 45 items
ADH SKN CLS APL DERMABOND .7 (GAUZE/BANDAGES/DRESSINGS) ×1
APL PRP STRL LF DISP 70% ISPRP (MISCELLANEOUS) ×1
APPLIER CLIP ROT 10 11.4 M/L (STAPLE) ×1
APR CLP MED LRG 11.4X10 (STAPLE) ×1
BAG COUNTER SPONGE SURGICOUNT (BAG) IMPLANT
BAG SPEC RTRVL 10 TROC 200 (ENDOMECHANICALS) ×1
BAG SPNG CNTER NS LX DISP (BAG)
CABLE HIGH FREQUENCY MONO STRZ (ELECTRODE) ×1 IMPLANT
CATH URETL OPEN 5X70 (CATHETERS) IMPLANT
CHLORAPREP W/TINT 26 (MISCELLANEOUS) ×1 IMPLANT
CLIP APPLIE ROT 10 11.4 M/L (STAPLE) ×1 IMPLANT
COVER MAYO STAND XLG (MISCELLANEOUS) ×1 IMPLANT
COVER SURGICAL LIGHT HANDLE (MISCELLANEOUS) ×1 IMPLANT
DERMABOND ADVANCED .7 DNX12 (GAUZE/BANDAGES/DRESSINGS) ×1 IMPLANT
DRAPE C-ARM 42X120 X-RAY (DRAPES) IMPLANT
ELECT REM PT RETURN 15FT ADLT (MISCELLANEOUS) ×1 IMPLANT
ENDOLOOP SUT PDS II 0 18 (SUTURE) ×1 IMPLANT
GLOVE BIO SURGEON STRL SZ7.5 (GLOVE) ×1 IMPLANT
GLOVE INDICATOR 8.0 STRL GRN (GLOVE) ×1 IMPLANT
GOWN STRL REUS W/ TWL XL LVL3 (GOWN DISPOSABLE) ×2 IMPLANT
GOWN STRL REUS W/TWL XL LVL3 (GOWN DISPOSABLE) ×2
GRASPER SUT TROCAR 14GX15 (MISCELLANEOUS) IMPLANT
HEMOSTAT SNOW SURGICEL 2X4 (HEMOSTASIS) IMPLANT
IRRIG SUCT STRYKERFLOW 2 WTIP (MISCELLANEOUS) ×1
IRRIGATION SUCT STRKRFLW 2 WTP (MISCELLANEOUS) ×1 IMPLANT
IV CATH 14GX2 1/4 (CATHETERS) ×1 IMPLANT
KIT BASIN OR (CUSTOM PROCEDURE TRAY) ×1 IMPLANT
KIT TURNOVER KIT A (KITS) IMPLANT
L-HOOK LAP DISP 36CM (ELECTROSURGICAL) ×1
LHOOK LAP DISP 36CM (ELECTROSURGICAL) IMPLANT
NDL INSUFFLATION 14GA 120MM (NEEDLE) ×1 IMPLANT
NEEDLE INSUFFLATION 14GA 120MM (NEEDLE) ×1
PENCIL SMOKE EVACUATOR (MISCELLANEOUS) IMPLANT
POUCH RETRIEVAL ECOSAC 10 (ENDOMECHANICALS) ×1 IMPLANT
SCISSORS LAP 5X35 DISP (ENDOMECHANICALS) ×1 IMPLANT
SET TUBE SMOKE EVAC HIGH FLOW (TUBING) ×1 IMPLANT
SLEEVE Z-THREAD 5X100MM (TROCAR) ×2 IMPLANT
SPIKE FLUID TRANSFER (MISCELLANEOUS) ×1 IMPLANT
STOPCOCK 4 WAY LG BORE MALE ST (IV SETS) IMPLANT
SUT MNCRL AB 4-0 PS2 18 (SUTURE) ×1 IMPLANT
TOWEL OR 17X26 10 PK STRL BLUE (TOWEL DISPOSABLE) ×1 IMPLANT
TOWEL OR NON WOVEN STRL DISP B (DISPOSABLE) IMPLANT
TRAY LAPAROSCOPIC (CUSTOM PROCEDURE TRAY) ×1 IMPLANT
TROCAR ADV FIXATION 12X100MM (TROCAR) ×1 IMPLANT
TROCAR Z-THREAD OPTICAL 5X100M (TROCAR) ×1 IMPLANT

## 2023-05-19 NOTE — Op Note (Signed)
Patient: Katherine Hays (10-May-1971, [id number])  Date of Surgery: 05/19/23   Preoperative Diagnosis: BILIARY DYSKINESIA   Postoperative Diagnosis: BILIARY DYSKINESIA   Surgical Procedure: LAPAROSCOPIC CHOLECYSTECTOMY:    Operative Team Members:  Surgeons and Role:    * Lizzie Cokley, Hyman Hopes, MD - Primary   Anesthesiologist: Elmer Picker, MD CRNA: Elisabeth Cara, CRNA; Cleda Clarks, CRNA   Anesthesia: General   Fluids:  Total I/O In: 1100 [I.V.:1000; IV Piggyback:100] Out: 20 [Blood:20]  Complications: * No complications entered in OR log *  Drains:  none   Specimen:  ID Type Source Tests Collected by Time Destination  1 : Gallbladder Tissue PATH Gallbladder SURGICAL PATHOLOGY Kaisyn Millea, Hyman Hopes, MD 05/19/2023 1211      Disposition:  PACU - hemodynamically stable.  Plan of Care: Discharge to home after PACU    Indications for Procedure: Katherine Hays is a 52 y.o. female who presented with abdominal pain.  History, physical and imaging was concerning for billiary colic.  Laparoscopic cholecystectomy was recommended for the patient.  The procedure itself, as well as the risks, benefits and alternatives were discussed with the patient.  Risks discussed included but were not limited to the risk of infection, bleeding, damage to nearby structures, need to convert to open procedure, incisional hernia, bile leak, common bile duct injury and the need for additional procedures or surgeries.  With this discussion complete and all questions answered the patient granted consent to proceed.  Findings: distended gallbladder  Infection status: Patient: Private Patient Elective Case Case: Elective Infection Present At Time Of Surgery (PATOS):  Some spillage of bile   Description of Procedure:   On the date stated above, the patient was taken to the operating room suite and placed in supine positioning.  Sequential compression devices were placed on the  lower extremities to prevent blood clots.  General endotracheal anesthesia was induced. Preoperative antibiotics were given.  The patient's abdomen was prepped and draped in the usual sterile fashion.  A time-out was completed verifying the correct patient, procedure, positioning and equipment needed for the case.  We began by anesthetizing the skin with local anesthetic and then making a 5 mm incision just below the umbilicus.  We dissected through the subcutaneous tissues to the fascia.  The fascia was grasped and elevated using a Kocher clamp.  A Veress needle was inserted into the abdomen and the abdomen was insufflated to 15 mmHg.  A 5 mm trocar was inserted in this position under optical guidance and then the abdomen was inspected.  There was no trauma to the underlying viscera with initial trocar placement.  Any abnormal findings, other than inflammation in the right upper quadrant, are listed above in the findings section.  Three additional trocars were placed, one 12 mm trocar in the subxiphoid position, one 5 mm trocar in the midline epigastric area and one 5mm trocar in the right upper quadrant subcostally.  These were placed under direct vision without any trauma to the underlying viscera.    The patient was then placed in head up, left side down positioning.  The gallbladder was identified and dissected free from its attachments to the omentum allowing the duodenum to fall away.  The infundibulum of the gallbladder was dissected free working laterally to medially.  The cystic duct and cystic artery were dissected free from surrounding connective tissue.  The infundibulum of the gallbladder was dissected off the cystic plate.  A critical view of safety was obtained  with the cystic duct and cystic artery being cleared of connective tissues and clearly the only two structures entering into the gallbladder with the liver clearly visible behind.  Clips were then applied to the cystic duct and cystic  artery and then these structures were divided.  A PDS endoloop was placed on the cystic duct stump. The gallbladder was dissected off the cystic plate, placed in an endocatch bag and removed from the 12 mm subxiphoid port site.  The clips were inspected and appeared effective.  The cystic plate was inspected and hemostasis was obtained using electrocautery.  A suction irrigator was used to clean the operative field.  Attention was turned to closure.  The 12 mm subxiphoid port site was closed using a 0-vicryl suture on a fascial suture passer.  The abdomen was desufflated.  The skin was closed using 4-0 monocryl and dermabond.  All sponge and needle counts were correct at the conclusion of the case.    Ivar Drape, MD General, Bariatric, & Minimally Invasive Surgery [facility], [place]

## 2023-05-19 NOTE — Anesthesia Postprocedure Evaluation (Signed)
Anesthesia Post Note  Patient: Katherine Hays  Procedure(s) Performed: LAPAROSCOPIC CHOLECYSTECTOMY     Patient location during evaluation: PACU Anesthesia Type: General Level of consciousness: awake and alert Pain management: pain level controlled Vital Signs Assessment: post-procedure vital signs reviewed and stable Respiratory status: spontaneous breathing, nonlabored ventilation, respiratory function stable and patient connected to nasal cannula oxygen Cardiovascular status: blood pressure returned to baseline and stable Postop Assessment: no apparent nausea or vomiting Anesthetic complications: no  No notable events documented.  Last Vitals:  Vitals:   05/19/23 1345 05/19/23 1403  BP: (!) 142/69 (!) 140/67  Pulse: (!) 57   Resp: 14 14  Temp: 36.4 C 36.7 C  SpO2: 100% 100%    Last Pain:  Vitals:   05/19/23 1403  TempSrc: Temporal  PainSc: 3                  [name] L Sharma Lawrance

## 2023-05-19 NOTE — Discharge Instructions (Signed)
 CHOLECYSTECTOMY POST OPERATIVE INSTRUCTIONS  Thinking Clearly  The anesthesia may cause you to feel different for 1 or 2 days. Do not drive, drink alcohol, or make any big decisions for at least 2 days.  Nutrition When you wake up, you will be able to drink small amounts of liquid. If you do not feel sick, you can slowly advance your diet to regular foods. Continue to drink lots of fluids, usually about 8 to 10 glasses per day. Eat a high-fiber diet so you don't strain during bowel movements. High-Fiber Foods Foods high in fiber include beans, bran cereals and whole-grain breads, peas, dried fruit (figs, apricots, and dates), raspberries, blackberries, strawberries, sweet corn, broccoli, baked potatoes with skin, plums, pears, apples, greens, and nuts. Activity Slowly increase your activity. Be sure to get up and walk every hour or so to prevent blood clots. No heavy lifting or strenuous activity for 4 weeks following surgery to prevent hernias at your incision sites It is normal to feel tired. You may need more sleep than usual.  Get your rest but make sure to get up and move around frequently to prevent blood clots and pneumonia.  Work and Return to School You can go back to work when you feel well enough. Discuss the timing with your surgeon. You can usually go back to school or work 1 week after an operation. If your work requires heavy lifting or strenuous activity you need to be placed on light duty for 4 weeks following surgery. You can return to gym class, sports or other physical activities 4 weeks after surgery.  Wound Care Always wash your hands before and after touching near your incision site. Do not soak in a bathtub until cleared at your follow up appointment. You may take a shower 24 hours after surgery. A small amount of drainage from the incision is normal. If the drainage is thick and yellow or the site is red, you may have an infection, so call your surgeon. If you  have a drain in one of your incisions, it will be taken out in office when the drainage stops. Steri-Strips will fall off in 7 to 10 days or they will be removed during your first office visit. If you have dermabond glue covering over the incision, allow the glue to flake off on its own. Avoid wearing tight or rough clothing. It may rub your incisions and make it harder for them to heal. Protect the new skin, especially from the sun. The sun can burn and cause darker scarring. Your scar will heal in about 4 to 6 weeks and will become softer and continue to fade over the next year.  The cosmetic appearance of the incisions will improve over the course of the first year after surgery. Sensation around your incision will return in a few weeks or months.  Bowel Movements After intestinal surgery, you may have loose watery stools for several days. If watery diarrhea lasts longer than 3 days, contact your surgeon. Pain medication (narcotics) can cause constipation. Increase the fiber in your diet with high-fiber foods if you are constipated. You can take an over the counter stool softener like Colace to avoid constipation.  Additional over the counter medications can also be used if Colace isn't sufficient (for example, Milk of Magnesia or Miralax).  Pain The amount of pain is different for each person. Some people need only 1 to 3 doses of pain control medication, while others need more. Take alternating doses of tylenol   and ibuprofen around the clock for the first five days following surgery.  This will provide a baseline of pain control and help with inflammation.  Take the narcotic pain medication in addition if needed for severe pain.  Contact Your Surgeon at [phone], if you have: Pain in your right upper abdomen like a gallbladder attack. Pain that will not go away Pain that gets worse A fever of more than 101F (38.3C) Repeated vomiting Swelling, redness, bleeding, or bad-smelling  drainage from your wound site Strong abdominal pain No bowel movement or unable to pass gas for 3 days Watery diarrhea lasting longer than 3 days  Pain Control The goal of pain control is to minimize pain, keep you moving and help you heal. Your surgical team will work with you on your pain plan. Most often a combination of therapies and medications are used to control your pain. You may also be given medication (local anesthetic) at the surgical site. This may help control your pain for several days. Extreme pain puts extra stress on your body at a time when your body needs to focus on healing. Do not wait until your pain has reached a level "10" or is unbearable before telling your doctor or nurse. It is much easier to control pain before it becomes severe. Following a laparoscopic procedure, pain is sometimes felt in the shoulder. This is due to the gas inserted into your abdomen during the procedure. Moving and walking helps to decrease the gas and the right shoulder pain.  Use the guide below for ways to manage your post-operative pain. Learn more by going to facs.org/safepaincontrol.  How Intense Is My Pain Common Therapies to Feel Better       I hardly notice my pain, and it does not interfere with my activities.  I notice my pain and it distracts me, but I can still do activities (sitting up, walking, standing).  Non-Medication Therapies  Ice (in a bag, applied over clothing at the surgical site), elevation, rest, meditation, massage, distraction (music, TV, play) walking and mild exercise Splinting the abdomen with pillows +  Non-Opioid Medications Acetaminophen (Tylenol) Non-steroidal anti-inflammatory drugs (NSAIDS) Aspirin, Ibuprofen (Motrin, Advil) Naproxen (Aleve) Take these as needed, when you feel pain. Both acetaminophen and NSAIDs help to decrease pain and swelling (inflammation).      My pain is hard to ignore and is more noticeable even when I rest.  My  pain interferes with my usual activities.  Non-Medication Therapies  +  Non-Opioid medications  Take on a regular schedule (around-the-clock) instead of as needed. (For example, Tylenol every 6 hours at 9:00 am, 3:00 pm, 9:00 pm, 3:00 am and Motrin every 6 hours at 12:00 am, 6:00 am, 12:00 pm, 6:00 pm)         I am focused on my pain, and I am not doing my daily activities.  I am groaning in pain, and I cannot sleep. I am unable to do anything.  My pain is as bad as it could be, and nothing else matters.  Non-Medication Therapies  +  Around-the-Clock Non-Opioid Medications  +  Short-acting opioids  Opioids should be used with other medications to manage severe pain. Opioids block pain and give a feeling of euphoria (feel high). Addiction, a serious side effect of opioids, is rare with short-term (a few days) use.  Examples of short-acting opioids include: Tramadol (Ultram), Hydrocodone (Norco, Vicodin), Hydromorphone (Dilaudid), Oxycodone (Oxycontin)     The above directions have been adapted from   the American College of [employer] Surgical Patient Education Program.  Please refer to the ACS website if needed: https://www.facs.org/-/media/files/education/patient-ed/cholesys.ashx.   Norvell Caswell, MD [facility] [address], [address], [place], [place]  [address] ?  [address] [address], [place], [place]   [address] [phone] ? [phone] ? FAX [phone] Web site: [email or web address]  

## 2023-05-19 NOTE — Transfer of Care (Signed)
Immediate Anesthesia Transfer of Care Note  Patient: Katherine Hays  Procedure(s) Performed: LAPAROSCOPIC CHOLECYSTECTOMY  Patient Location: PACU  Anesthesia Type:General  Level of Consciousness: awake, alert , and oriented  Airway & Oxygen Therapy: Patient Spontanous Breathing and Patient connected to face mask oxygen  Post-op Assessment: Report given to RN and Post -op Vital signs reviewed and stable  Post vital signs: Reviewed and stable  Last Vitals:  Vitals Value Taken Time  BP 114/59 05/19/23 1246  Temp    Pulse 58 05/19/23 1248  Resp 14 05/19/23 1248  SpO2 100 % 05/19/23 1248  Vitals shown include unfiled device data.  Last Pain:  Vitals:   05/19/23 0943  TempSrc: Oral  PainSc: 0-No pain      Patients Stated Pain Goal: 5 (05/19/23 0943)  Complications: No notable events documented.

## 2023-05-19 NOTE — Anesthesia Procedure Notes (Signed)
Procedure Name: Intubation Date/Time: 05/19/2023 11:49 AM  Performed by: Elisabeth Cara, CRNAPre-anesthesia Checklist: Patient identified, Emergency Drugs available, Suction available, Patient being monitored and Timeout performed Patient Re-evaluated:Patient Re-evaluated prior to induction Oxygen Delivery Method: Circle system utilized Preoxygenation: Pre-oxygenation with 100% oxygen Induction Type: IV induction Ventilation: Mask ventilation without difficulty Laryngoscope Size: Mac and 4 Grade View: Grade I Tube type: Oral Tube size: 7.5 mm Number of attempts: 1 Airway Equipment and Method: Stylet Placement Confirmation: ETT inserted through vocal cords under direct vision, positive ETCO2 and breath sounds checked- equal and bilateral Secured at: 22 cm Tube secured with: Tape Dental Injury: Teeth and Oropharynx as per pre-operative assessment

## 2023-05-19 NOTE — Progress Notes (Signed)
POCT HCG test negative

## 2023-05-19 NOTE — Anesthesia Preprocedure Evaluation (Addendum)
Anesthesia Evaluation  Patient identified by MRN, date of birth, ID band Patient awake    Reviewed: Allergy & Precautions, NPO status , Patient's Chart, lab work & pertinent test results  History of Anesthesia Complications (+) PONV and history of anesthetic complications  Airway Mallampati: II  TM Distance: >3 FB Neck ROM: Full    Dental no notable dental hx. (+) Teeth Intact, Dental Advisory Given   Pulmonary asthma    Pulmonary exam normal breath sounds clear to auscultation       Cardiovascular negative cardio ROS Normal cardiovascular exam Rhythm:Regular Rate:Normal     Neuro/Psych  Headaches PSYCHIATRIC DISORDERS Anxiety        GI/Hepatic Neg liver ROS,GERD  ,,  Endo/Other  Hypothyroidism    Renal/GU negative Renal ROS  negative genitourinary   Musculoskeletal  (+) Arthritis ,    Abdominal   Peds  Hematology negative hematology ROS (+)   Anesthesia Other Findings   Reproductive/Obstetrics                             Anesthesia Physical Anesthesia Plan  ASA: 2  Anesthesia Plan: General   Post-op Pain Management: Tylenol PO (pre-op)*   Induction: Intravenous  PONV Risk Score and Plan: 4 or greater and Midazolam, Dexamethasone, Ondansetron, Scopolamine patch - Pre-op and TIVA  Airway Management Planned: Oral ETT  Additional Equipment:   Intra-op Plan:   Post-operative Plan: Extubation in OR  Informed Consent: I have reviewed the patients History and Physical, chart, labs and discussed the procedure including the risks, benefits and alternatives for the proposed anesthesia with the patient or authorized representative who has indicated his/her understanding and acceptance.     Dental advisory given  Plan Discussed with: CRNA  Anesthesia Plan Comments:        Anesthesia Quick Evaluation

## 2023-05-19 NOTE — H&P (Signed)
Admitting Physician: Hyman Hopes Dontavian Marchi  Service: General Surgery  CC: Gallbladder problems  Subjective   HPI: Katherine Hays is an 52 y.o. female who is here for gallbladder removal.  Past Medical History:  Diagnosis Date   Anxiety    Arthritis    in neck, hip and knee   Asthma    GERD (gastroesophageal reflux disease)    Headache    migraines   Nerve pain    right leg   OCD (obsessive compulsive disorder)    PONV (postoperative nausea and vomiting)    Thyroid disease    hypo    Past Surgical History:  Procedure Laterality Date   ROTATOR CUFF REPAIR Left 10/22/2021   TONSILLECTOMY     TUBAL LIGATION     WISDOM TOOTH EXTRACTION      Family History  Problem Relation Age of Onset   Hyperthyroidism Mother    Liver disease Father        cirrhosis   Hypertension Brother    Diabetes Maternal Grandfather    Stroke Maternal Grandfather    Breast cancer Paternal Grandmother    Cancer Paternal Grandmother        breast   Heart disease Paternal Grandfather    Stroke Paternal Grandfather    Pancreatic cancer Maternal Uncle    Crohn's disease Other        Nephew   Colon cancer Neg Hx    Stomach cancer Neg Hx    Esophageal cancer Neg Hx     Social:  reports that she has never smoked. She has never used smokeless tobacco. She reports that she does not drink alcohol and does not use drugs.  Allergies:  Allergies  Allergen Reactions   Codeine Itching    Medications: Current Outpatient Medications  Medication Instructions   ALPRAZolam (XANAX) 0.5 mg, Oral, 2 times daily   dicyclomine (BENTYL) 10 mg, Oral, 2 times daily, Take 30 minutes before breakfast and dinner   famotidine (PEPCID) 20 mg, Oral, 2 times daily   ibuprofen (ADVIL) 400-600 mg, Oral, Every 6 hours PRN   levonorgestrel (MIRENA) 20 MCG/24HR IUD 1 each, Intrauterine,  Once   levothyroxine (SYNTHROID) 50 mcg, Oral, Daily before breakfast   omeprazole (PRILOSEC) 40 MG capsule Take 1 capsule  (40 mg) by mouth 2 times daily.   sertraline (ZOLOFT) 100 mg, Oral, Daily   traZODone (DESYREL) 100 mg, Oral, Daily at bedtime    ROS - all of the below systems have been reviewed with the patient and positives are indicated with bold text General: chills, fever or night sweats Eyes: blurry vision or double vision ENT: epistaxis or sore throat Allergy/Immunology: itchy/watery eyes or nasal congestion Hematologic/Lymphatic: bleeding problems, blood clots or swollen lymph nodes Endocrine: temperature intolerance or unexpected weight changes Breast: new or changing breast lumps or nipple discharge Resp: cough, shortness of breath, or wheezing CV: chest pain or dyspnea on exertion GI: as per HPI GU: dysuria, trouble voiding, or hematuria MSK: joint pain or joint stiffness Neuro: TIA or stroke symptoms Derm: pruritus and skin lesion changes Psych: anxiety and depression  Objective   PE Blood pressure (!) 121/52, pulse 73, temperature 99.3 F (37.4 C), temperature source Oral, resp. rate 18, height 5\' 7"  (1.702 m), weight 72.6 kg, SpO2 98%. Constitutional: NAD; conversant; no deformities Eyes: Moist conjunctiva; no lid lag; anicteric; PERRL Neck: Trachea midline; no thyromegaly Lungs: Normal respiratory effort; no tactile fremitus CV: RRR; no palpable thrills; no pitting edema GI: Abd  Soft, nontender; no palpable hepatosplenomegaly MSK: Normal range of motion of extremities; no clubbing/cyanosis Psychiatric: Appropriate affect; alert and oriented x3 Lymphatic: No palpable cervical or axillary lymphadenopathy  Results for orders placed or performed during the hospital encounter of 05/19/23 (from the past 24 hour(s))  Pregnancy, urine POC     Status: None   Collection Time: 05/19/23  9:48 AM  Result Value Ref Range   Preg Test, Ur NEGATIVE NEGATIVE    Imaging Orders  No imaging studies ordered today  HIDA 04/14/23  Calculated gallbladder ejection fraction is 27%. (Normal  gallbladder ejection fraction with Ensure is greater than 33%.)  IMPRESSION: Reduced gallbladder ejection fraction as can be seen with chronic cholecystitis/biliary dyskinesia.  RUQ [place] 12/13/22 IMPRESSION: 1. There is a 5 mm gallbladder polyp. No imaging follow-up needed. 2. No cholelithiasis or sonographic evidence for acute cholecystitis.    Assessment and Plan   Biliary dyskinesia  Gallbladder polyp   Katherine Hays has biliary colic, a gallbladder polyp on ultrasound, and biliary dyskinesia on HIDA scan. I recommended laparoscopic cholecystectomy. We discussed the procedure itself as well as its risk, benefits, and alternatives. After full discussion all questions answered the patient granted consent to proceed.   Quentin Ore, MD  [facility] Use [email or web address] to contact on call provider

## 2023-05-20 ENCOUNTER — Encounter (HOSPITAL_COMMUNITY): Payer: Self-pay | Admitting: Surgery

## 2023-05-20 LAB — SURGICAL PATHOLOGY

## 2023-05-30 ENCOUNTER — Encounter: Payer: Self-pay | Admitting: Family

## 2023-05-30 ENCOUNTER — Ambulatory Visit (INDEPENDENT_AMBULATORY_CARE_PROVIDER_SITE_OTHER): Payer: 59 | Admitting: Family

## 2023-05-30 VITALS — BP 111/67 | HR 82 | Temp 97.9°F | Ht 67.0 in | Wt 160.0 lb

## 2023-05-30 DIAGNOSIS — R52 Pain, unspecified: Secondary | ICD-10-CM | POA: Diagnosis not present

## 2023-05-30 DIAGNOSIS — J029 Acute pharyngitis, unspecified: Secondary | ICD-10-CM

## 2023-05-30 DIAGNOSIS — R0981 Nasal congestion: Secondary | ICD-10-CM | POA: Diagnosis not present

## 2023-05-30 DIAGNOSIS — J069 Acute upper respiratory infection, unspecified: Secondary | ICD-10-CM

## 2023-05-30 LAB — VERITOR FLU A/B WAIVED
Influenza A: NEGATIVE
Influenza B: NEGATIVE

## 2023-05-30 LAB — CULTURE, GROUP A STREP

## 2023-05-30 LAB — RAPID STREP SCREEN (MED CTR MEBANE ONLY): Strep Gp A Ag, IA W/Reflex: NEGATIVE

## 2023-05-30 MED ORDER — FLUTICASONE PROPIONATE 50 MCG/ACT NA SUSP
2.0000 | Freq: Every day | NASAL | 6 refills | Status: DC
Start: 2023-05-30 — End: 2023-10-05

## 2023-05-30 MED ORDER — CETIRIZINE HCL 10 MG PO TABS
10.0000 mg | ORAL_TABLET | Freq: Every day | ORAL | 1 refills | Status: DC
Start: 2023-05-30 — End: 2023-10-05

## 2023-05-30 NOTE — Patient Instructions (Signed)

## 2023-05-30 NOTE — Progress Notes (Signed)
Subjective:    Patient ID: Katherine Hays, female    DOB: [birth year], 52 y.o.   MRN: [id number]  Chief Complaint  Patient presents with   Nasal Congestion    Started yesterday after lunch   Sore Throat   Generalized Body Aches   Pt presents to the office today with sore throat and body aches that started yesterday.  Sore Throat  This is a new problem. The current episode started yesterday. The problem has been unchanged. The pain is worse on the right side. There has been no fever. The pain is at a severity of 7/10. The pain is mild. Associated symptoms include congestion, swollen glands and trouble swallowing. Pertinent negatives include no coughing, ear pain, headaches or shortness of breath. She has tried acetaminophen for the symptoms. The treatment provided mild relief.      Review of Systems  HENT:  Positive for congestion and trouble swallowing. Negative for ear pain.   Respiratory:  Negative for cough and shortness of breath.   Neurological:  Negative for headaches.  All other systems reviewed and are negative.      Objective:   Physical Exam Vitals reviewed.  Constitutional:      General: She is not in acute distress.    Appearance: She is well-developed.  HENT:     Head: Normocephalic and atraumatic.     Right Ear: External ear normal.  Eyes:     Pupils: Pupils are equal, round, and reactive to light.  Neck:     Thyroid: No thyromegaly.  Cardiovascular:     Rate and Rhythm: Normal rate and regular rhythm.     Heart sounds: Normal heart sounds. No murmur heard. Pulmonary:     Effort: Pulmonary effort is normal. No respiratory distress.     Breath sounds: Normal breath sounds. No wheezing.  Abdominal:     General: Bowel sounds are normal. There is no distension.     Palpations: Abdomen is soft.     Tenderness: There is no abdominal tenderness.  Musculoskeletal:        General: No tenderness. Normal range of motion.     Cervical back: Normal range of  motion and neck supple.  Skin:    General: Skin is warm and dry.  Neurological:     Mental Status: She is alert and oriented to person, place, and time.     Cranial Nerves: No cranial nerve deficit.     Deep Tendon Reflexes: Reflexes are normal and symmetric.  Psychiatric:        Behavior: Behavior normal.        Thought Content: Thought content normal.        Judgment: Judgment normal.      BP 111/67   Pulse 82   Temp 97.9 F (36.6 C) (Temporal)   Ht 5\' 7"  (1.702 m)   Wt 160 lb (72.6 kg)   SpO2 100%   BMI 25.06 kg/m       Assessment & Plan:   Katherine Hays comes in today with chief complaint of Nasal Congestion (Started yesterday after lunch), Sore Throat, and Generalized Body Aches   Diagnosis and orders addressed:  1. Sore throat - Novel Coronavirus, NAA ([facility]) - Veritor Flu A/B Waived - Rapid Strep Screen ([facility] ONLY)  2. Nasal congestion - Novel Coronavirus, NAA ([facility]) - Veritor Flu A/B Waived  3. Body aches - Novel Coronavirus, NAA ([facility]) - Veritor Flu A/B Waived - Rapid Strep Screen ([facility] ONLY)  4. Viral URI - Take meds as prescribed - Use a cool mist humidifier  -Use saline nose sprays frequently -Force fluids -For any cough or congestion  Use plain Mucinex- regular strength or max strength is fine -For fever or aces or pains- take tylenol or ibuprofen. -Throat lozenges if help -Follow up if symptoms worsen or do not improve  - cetirizine (ZYRTEC ALLERGY) 10 MG tablet; Take 1 tablet (10 mg total) by mouth daily.  Dispense: 90 tablet; Refill: 1 - fluticasone (FLONASE) 50 MCG/ACT nasal spray; Place 2 sprays into both nostrils daily.  Dispense: 16 g; Refill: 6   Katherine Rodney, FNP

## 2023-05-31 LAB — NOVEL CORONAVIRUS, NAA: SARS-CoV-2, NAA: NOT DETECTED

## 2023-06-09 ENCOUNTER — Ambulatory Visit (INDEPENDENT_AMBULATORY_CARE_PROVIDER_SITE_OTHER): Payer: 59 | Admitting: Family

## 2023-06-09 ENCOUNTER — Encounter: Payer: Self-pay | Admitting: Family

## 2023-06-09 VITALS — Temp 97.1°F | Resp 20 | Ht 67.0 in | Wt 156.0 lb

## 2023-06-09 DIAGNOSIS — J209 Acute bronchitis, unspecified: Secondary | ICD-10-CM

## 2023-06-09 MED ORDER — PREDNISONE 10 MG (21) PO TBPK
ORAL_TABLET | ORAL | 0 refills | Status: DC
Start: 2023-06-09 — End: 2023-10-05

## 2023-06-09 MED ORDER — BENZONATATE 200 MG PO CAPS
200.0000 mg | ORAL_CAPSULE | Freq: Three times a day (TID) | ORAL | 1 refills | Status: DC | PRN
Start: 2023-06-09 — End: 2023-12-16

## 2023-06-09 MED ORDER — PROMETHAZINE-DM 6.25-15 MG/5ML PO SYRP
5.0000 mL | ORAL_SOLUTION | Freq: Three times a day (TID) | ORAL | 0 refills | Status: DC | PRN
Start: 2023-06-09 — End: 2023-09-05

## 2023-06-09 NOTE — Progress Notes (Signed)
Subjective:    Patient ID: Katherine Hays, female    DOB: [birth year], 52 y.o.   MRN: [id number]  Chief Complaint  Patient presents with   Cough   Pt presents to the office today with cough. She was seen on 05/30/23 and diagnosed with URI. She continues to have a cough.  Cough This is a new problem. The current episode started 1 to 4 weeks ago. The problem has been unchanged. The cough is Productive of purulent sputum and productive of sputum. Associated symptoms include ear congestion and ear pain. Pertinent negatives include no chills, fever, headaches, myalgias, shortness of breath or wheezing. She has tried rest and OTC cough suppressant for the symptoms. The treatment provided mild relief. There is no history of asthma.      Review of Systems  Constitutional:  Negative for chills and fever.  HENT:  Positive for ear pain.   Respiratory:  Positive for cough. Negative for shortness of breath and wheezing.   Musculoskeletal:  Negative for myalgias.  Neurological:  Negative for headaches.  All other systems reviewed and are negative.      Objective:   Physical Exam Vitals reviewed.  Constitutional:      General: She is not in acute distress.    Appearance: She is well-developed.  HENT:     Head: Normocephalic and atraumatic.  Eyes:     Pupils: Pupils are equal, round, and reactive to light.  Neck:     Thyroid: No thyromegaly.  Cardiovascular:     Rate and Rhythm: Normal rate and regular rhythm.     Heart sounds: Normal heart sounds. No murmur heard. Pulmonary:     Effort: Pulmonary effort is normal. No respiratory distress.     Breath sounds: Normal breath sounds. No wheezing.     Comments: Coarse nonproductive cough Abdominal:     General: Bowel sounds are normal. There is no distension.     Palpations: Abdomen is soft.     Tenderness: There is no abdominal tenderness.  Musculoskeletal:        General: No tenderness. Normal range of motion.     Cervical back:  Normal range of motion and neck supple.  Skin:    General: Skin is warm and dry.  Neurological:     Mental Status: She is alert and oriented to person, place, and time.     Cranial Nerves: No cranial nerve deficit.     Deep Tendon Reflexes: Reflexes are normal and symmetric.  Psychiatric:        Behavior: Behavior normal.        Thought Content: Thought content normal.        Judgment: Judgment normal.       Temp (!) 97.1 F (36.2 C) (Oral)   Resp 20   Ht 5\' 7"  (1.702 m)   Wt 156 lb (70.8 kg)   SpO2 99%   BMI 24.43 kg/m      Assessment & Plan:   SHALAN TAHARA comes in today with chief complaint of Cough   Diagnosis and orders addressed:  1. Acute bronchitis, unspecified organism - Take meds as prescribed - Use a cool mist humidifier  -Use saline nose sprays frequently -Force fluids -For any cough or congestion  Use plain Mucinex- regular strength or max strength is fine -For fever or aces or pains- take tylenol or ibuprofen. -Throat lozenges if help -Follow up if symptoms worsen or do not improve  - predniSONE (STERAPRED UNI-PAK 21 TAB)  10 MG (21) TBPK tablet; Use as directed  Dispense: 21 tablet; Refill: 0 - benzonatate (TESSALON) 200 MG capsule; Take 1 capsule (200 mg total) by mouth 3 (three) times daily as needed.  Dispense: 30 capsule; Refill: 1 - promethazine-dextromethorphan (PROMETHAZINE-DM) 6.25-15 MG/5ML syrup; Take 5 mLs by mouth 3 (three) times daily as needed for cough.  Dispense: 240 mL; Refill: 0      Jannifer Rodney, FNP

## 2023-06-09 NOTE — Patient Instructions (Signed)

## 2023-06-11 ENCOUNTER — Other Ambulatory Visit: Payer: Self-pay | Admitting: Family Medicine

## 2023-06-11 MED ORDER — ALBUTEROL SULFATE HFA 108 (90 BASE) MCG/ACT IN AERS: 2.0000 | INHALATION_SPRAY | Freq: Four times a day (QID) | RESPIRATORY_TRACT | 0 refills | Status: DC | PRN

## 2023-08-11 ENCOUNTER — Ambulatory Visit (INDEPENDENT_AMBULATORY_CARE_PROVIDER_SITE_OTHER): Payer: 59 | Admitting: Nurse Practitioner

## 2023-08-11 ENCOUNTER — Encounter: Payer: Self-pay | Admitting: Nurse Practitioner

## 2023-08-11 ENCOUNTER — Other Ambulatory Visit (HOSPITAL_COMMUNITY): Payer: Self-pay

## 2023-08-11 VITALS — BP 104/69 | HR 82 | Temp 97.0°F | Ht 67.0 in | Wt 156.0 lb

## 2023-08-11 DIAGNOSIS — J3489 Other specified disorders of nose and nasal sinuses: Secondary | ICD-10-CM | POA: Diagnosis not present

## 2023-08-11 DIAGNOSIS — I7300 Raynaud's syndrome without gangrene: Secondary | ICD-10-CM | POA: Insufficient documentation

## 2023-08-11 MED ORDER — NIFEDIPINE ER OSMOTIC RELEASE 30 MG PO TB24
30.0000 mg | ORAL_TABLET | Freq: Every day | ORAL | 1 refills | Status: DC
Start: 2023-08-11 — End: 2024-04-13
  Filled 2023-08-11: qty 90, 90d supply, fill #0

## 2023-08-11 NOTE — Progress Notes (Signed)
   Subjective:    Patient ID: Katherine Hays, female    DOB: Sep 26, 1970, 52 y.o.   MRN: [id number]   Chief Complaint: Toes painful and Sores in nose   HPI  Patient come sin with 2 complaints: -Sores in nose- started when she had covid last year.  - has history or raynauds disease. Says that her toes are cold and hurt. Patient Active Problem List   Diagnosis Date Noted   Low hemoglobin 11/15/2020   Controlled substance agreement signed 02/04/2019   Benzodiazepine dependence (HCC) 02/04/2019   Gastroesophageal reflux disease 02/04/2019   Vitamin D deficiency 11/13/2017   Hypothyroidism 11/13/2017   Hyperlipemia 11/13/2017   Insomnia 11/10/2017   History of OCD (obsessive compulsive disorder) 01/12/2014   GAD (generalized anxiety disorder) 01/12/2014   Inguinal lymphadenopathy 05/26/2012       Review of Systems  Constitutional:  Negative for diaphoresis.  Eyes:  Negative for pain.  Respiratory:  Negative for shortness of breath.   Cardiovascular:  Negative for chest pain, palpitations and leg swelling.  Gastrointestinal:  Negative for abdominal pain.  Endocrine: Negative for polydipsia.  Skin:  Negative for rash.  Neurological:  Negative for dizziness, weakness and headaches.  Hematological:  Does not bruise/bleed easily.  All other systems reviewed and are negative.      Objective:   Physical Exam Constitutional:      Appearance: Normal appearance.  HENT:     Nose:     Comments: Sore in bil nares Cardiovascular:     Rate and Rhythm: Normal rate and regular rhythm.     Heart sounds: Normal heart sounds.     Comments: All toes are cyanotic and cold to touch. Small vesicular lesion on left 2nd toe. Pulmonary:     Breath sounds: Normal breath sounds.  Skin:    General: Skin is warm.  Neurological:     General: No focal deficit present.     Mental Status: She is alert and oriented to person, place, and time.  Psychiatric:        Mood and Affect: Mood  normal.        Behavior: Behavior normal.    BP 104/69   Pulse 82   Temp (!) 97 F (36.1 C) (Temporal)   Ht 5\' 7"  (1.702 m)   Wt 156 lb (70.8 kg)   BMI 24.43 kg/m         Assessment & Plan:   Katherine Hays in today with chief complaint of Toes painful and Sores in nose   1. Lesion of nose (Primary) Avoid picking  nose Triple antibiotic ointment bid  2. Raynaud's disease without gangrene Keep toes warm RTO prn - NIFEdipine (PROCARDIA-XL/NIFEDICAL-XL) 30 MG 24 hr tablet; Take 1 tablet (30 mg total) by mouth daily.  Dispense: 90 tablet; Refill: 1    The above assessment and management plan was discussed with the patient. The patient verbalized understanding of and has agreed to the management plan. Patient is aware to call the clinic if symptoms persist or worsen. Patient is aware when to return to the clinic for a follow-up visit. Patient educated on when it is appropriate to go to the emergency department.   Mary-Margaret Daphine Deutscher, FNP

## 2023-08-11 NOTE — Patient Instructions (Signed)
Raynaud's Phenomenon  Raynaud's phenomenon is a condition that affects the blood vessels (arteries) that carry blood to the fingers and toes. The arteries that supply blood to the ears, lips, nipples, or the tip of the nose might also be affected. Raynaud's phenomenon causes the arteries to become narrow temporarily (spasm). As a result, the flow of blood to the affected areas is temporarily decreased. This usually occurs in response to cold temperatures or stress. During an attack, the skin in the affected areas turns white, then blue, and finally red. A person may also feel tingling or numbness in those areas. Attacks usually last for only a brief period, and then the blood flow to the area returns to normal. In most cases, Raynaud's phenomenon does not cause serious health problems. What are the causes? In many cases, the cause of this condition is not known. The condition may occur on its own (primary Raynaud's phenomenon) or may be associated with other diseases or factors (secondary Raynaud's phenomenon). Possible causes may include: Diseases or medical conditions that damage the arteries. Injuries and repetitive actions that hurt the hands or feet. Being exposed to certain chemicals. Taking medicines that narrow the arteries. Other medical conditions, such as lupus, scleroderma, rheumatoid arthritis, thyroid problems, blood disorders, Sjogren syndrome, or atherosclerosis. What increases the risk? The following factors may make you more likely to develop this condition: Being 39-73 years old. Being female. Having a family history of Raynaud's phenomenon. Living in a cold climate. Smoking. What are the signs or symptoms? Symptoms of this condition usually occur when you are exposed to cold temperatures or when you have emotional stress. The symptoms may last for a few minutes or up to several hours. They usually affect your fingers but may also affect your toes, nipples, lips, ears, or the  tip of your nose. Symptoms may include: Changes in skin color. The skin in the affected areas will turn pale or white. The skin may then change from white to bluish to red as normal blood flow returns to the area. Numbness, tingling, or pain in the affected areas. In severe cases, symptoms may include: Skin sores. Tissues decaying and dying (gangrene). How is this diagnosed? This condition may be diagnosed based on: Your symptoms and medical history. A physical exam. During the exam, you may be asked to put your hands in cold water to check for a reaction to cold temperature. Tests, such as: Blood tests to check for other diseases or conditions. A test to check the movement of blood through your arteries and veins (vascular ultrasound). A test in which the skin at the base of your fingernail is examined under a microscope (nailfold capillaroscopy). How is this treated? During an episode, you can take actions to help symptoms go away faster. Options include moving your arms around in a windmill pattern, warming your fingers under warm water, or placing your fingers in a warm body fold, such as your armpit. Long-term treatment for this condition often involves making lifestyle changes and taking steps to control your exposure to cold temperature. For more severe cases, medicine (calcium channel blockers) may be used to improve blood circulation. Follow these instructions at home: Avoiding cold temperatures Take these steps to avoid exposure to cold: If possible, stay indoors during cold weather. When you go outside during cold weather, dress in layers and wear mittens, a hat, a scarf, and warm footwear. Wear mittens or gloves when handling ice or frozen food. Use holders for glasses or cans containing  cold drinks. Let warm water run for a while before taking a shower or bath. Warm up the car before driving in cold weather. Lifestyle If possible, avoid stressful and emotional situations. Try  to find ways to manage your stress, such as: Exercise. Yoga. Meditation. Biofeedback. Do not use any products that contain nicotine or tobacco. These products include cigarettes, chewing tobacco, and vaping devices, such as e-cigarettes. If you need help quitting, ask your health care provider. Avoid secondhand smoke. Limit your use of caffeine. Switch to decaffeinated coffee, tea, and soda. Avoid chocolate. Avoid vibrating tools and machinery. General instructions Protect your hands and feet from injuries, cuts, or bruises. Avoid wearing tight rings or wristbands. Wear loose fitting socks and comfortable, roomy shoes. Take over-the-counter and prescription medicines only as told by your health care provider. Where to find support Raynaud's Association: www.raynauds.org Where to find more information [employer] of Arthritis and Musculoskeletal and Skin Diseases: www.niams.http://www.myers.net/ Contact a health care provider if: Your discomfort becomes worse despite lifestyle changes. You develop sores on your fingers or toes that do not heal. You have breaks in the skin on your fingers or toes. You have a fever. You have pain or swelling in your joints. You have a rash. Your symptoms occur on only one side of your body. Get help right away if: Your fingers or toes turn black. You have severe pain in the affected areas. These symptoms may represent a serious problem that is an emergency. Do not wait to see if the symptoms will go away. Get medical help right away. Call your local emergency services (911 in the [place] Do not drive yourself to the hospital. Summary Raynaud's phenomenon is a condition that affects the arteries that carry blood to the fingers, toes, ears, lips, nipples, or the tip of the nose. In many cases, the cause of this condition is not known. Symptoms of this condition include changes in skin color along with numbness and tingling in the affected area. Treatment for  this condition includes lifestyle changes and reducing exposure to cold temperatures. Medicines may be used for severe cases of the condition. Contact your health care provider if your condition worsens despite treatment. This information is not intended to replace advice given to you by your health care provider. Make sure you discuss any questions you have with your health care provider. Document Revised: 10/10/2020 Document Reviewed: 10/10/2020 Elsevier Patient Education  2024 [employer].

## 2023-08-27 ENCOUNTER — Ambulatory Visit (INDEPENDENT_AMBULATORY_CARE_PROVIDER_SITE_OTHER): Payer: 59 | Admitting: *Deleted

## 2023-08-27 DIAGNOSIS — Z23 Encounter for immunization: Secondary | ICD-10-CM | POA: Diagnosis not present

## 2023-08-27 NOTE — Progress Notes (Signed)
 Updated Tetnus, pt got a small cut this morning thinking there maybe a little rust on the blade she was using. Injection was tolerated well by the patient. (See MAR for injection details)

## 2023-09-05 ENCOUNTER — Telehealth (INDEPENDENT_AMBULATORY_CARE_PROVIDER_SITE_OTHER): Payer: 59 | Admitting: Family Medicine

## 2023-09-05 ENCOUNTER — Encounter: Payer: Self-pay | Admitting: Family Medicine

## 2023-09-05 DIAGNOSIS — U071 COVID-19: Secondary | ICD-10-CM

## 2023-09-05 MED ORDER — MOLNUPIRAVIR EUA 200MG CAPSULE
4.0000 | ORAL_CAPSULE | Freq: Two times a day (BID) | ORAL | 0 refills | Status: AC
Start: 2023-09-05 — End: 2023-09-10

## 2023-09-05 MED ORDER — PROMETHAZINE-DM 6.25-15 MG/5ML PO SYRP: 5.0000 mL | ORAL_SOLUTION | Freq: Three times a day (TID) | ORAL | 0 refills | Status: DC | PRN

## 2023-09-05 NOTE — Progress Notes (Signed)
Virtual Visit via Video Note  I connected with Katherine Hays on 09/05/23 at  4:30 PM EST by a video enabled telemedicine application and verified that I am speaking with the correct person using two identifiers.  Patient Location: Home Provider Location: Office/Clinic  I discussed the limitations, risks, security, and privacy concerns of performing an evaluation and management service by video and the availability of in person appointments. I also discussed with the patient that there may be a patient responsible charge related to this service. The patient expressed understanding and agreed to proceed.  Subjective: PCP: Junie Spencer, FNP  Chief Complaint  Patient presents with   Covid Positive   HPI States that symptoms started yesterday. She took an at home covid test today that was positive. She reports congestion, fever of 100.3, coughing phlegm that is light yellow/clear and has rhinorrhea. Denies sore throat. Has only tried nyquil. Denies trouble breathing    ROS: Per HPI  Current Outpatient Medications:    albuterol (VENTOLIN HFA) 108 (90 Base) MCG/ACT inhaler, Inhale 2 puffs into the lungs every 6 (six) hours as needed for wheezing or shortness of breath. As needed, Disp: 8 g, Rfl: 0   ALPRAZolam (XANAX) 0.5 MG tablet, Take 1 tablet (0.5 mg total) by mouth 2 (two) times daily., Disp: 60 tablet, Rfl: 5   benzonatate (TESSALON) 200 MG capsule, Take 1 capsule (200 mg total) by mouth 3 (three) times daily as needed., Disp: 30 capsule, Rfl: 1   cetirizine (ZYRTEC ALLERGY) 10 MG tablet, Take 1 tablet (10 mg total) by mouth daily. (Patient not taking: Reported on 06/09/2023), Disp: 90 tablet, Rfl: 1   dicyclomine (BENTYL) 10 MG capsule, Take 1 capsule (10 mg total) by mouth 2 (two) times daily. Take 30 minutes before breakfast and dinner, Disp: 60 capsule, Rfl: 0   famotidine (PEPCID) 20 MG tablet, Take 1 tablet (20 mg total) by mouth 2 (two) times daily., Disp: 60 tablet,  Rfl: 0   fluticasone (FLONASE) 50 MCG/ACT nasal spray, Place 2 sprays into both nostrils daily., Disp: 16 g, Rfl: 6   ibuprofen (ADVIL) 200 MG tablet, Take 400-600 mg by mouth every 6 (six) hours as needed for moderate pain., Disp: , Rfl:    levonorgestrel (MIRENA) 20 MCG/24HR IUD, 1 each by Intrauterine route once., Disp: , Rfl:    levothyroxine (SYNTHROID) 50 MCG tablet, Take 1 tablet (50 mcg total) by mouth daily before breakfast., Disp: 90 tablet, Rfl: 3   NIFEdipine (PROCARDIA-XL/NIFEDICAL-XL) 30 MG 24 hr tablet, Take 1 tablet (30 mg total) by mouth daily., Disp: 90 tablet, Rfl: 1   omeprazole (PRILOSEC) 40 MG capsule, Take 1 capsule (40 mg) by mouth 2 times daily., Disp: 60 capsule, Rfl: 2   oxyCODONE-acetaminophen (PERCOCET) 5-325 MG tablet, Take 1 tablet by mouth every 4 (four) hours as needed for severe pain., Disp: 20 tablet, Rfl: 0   predniSONE (STERAPRED UNI-PAK 21 TAB) 10 MG (21) TBPK tablet, Use as directed, Disp: 21 tablet, Rfl: 0   promethazine-dextromethorphan (PROMETHAZINE-DM) 6.25-15 MG/5ML syrup, Take 5 mLs by mouth 3 (three) times daily as needed for cough., Disp: 240 mL, Rfl: 0   sertraline (ZOLOFT) 100 MG tablet, Take 1 tablet (100 mg total) by mouth daily., Disp: 90 tablet, Rfl: 1   traZODone (DESYREL) 100 MG tablet, Take 1 tablet (100 mg total) by mouth at bedtime., Disp: 90 tablet, Rfl: 2  Observations/Objective: There were no vitals filed for this visit. Physical Exam Constitutional:  General: She is awake. She is not in acute distress.    Appearance: Normal appearance. She is well-developed and well-groomed. She is ill-appearing. She is not toxic-appearing or diaphoretic.  HENT:     Nose: Congestion and rhinorrhea present. Rhinorrhea is clear.  Pulmonary:     Effort: Pulmonary effort is normal.  Neurological:     General: No focal deficit present.     Mental Status: She is alert and oriented to person, place, and time.  Psychiatric:        Attention and  Perception: Attention and perception normal.        Mood and Affect: Mood and affect normal.        Speech: Speech normal.        Behavior: Behavior normal. Behavior is cooperative.        Thought Content: Thought content normal.        Cognition and Memory: Cognition and memory normal.        Judgment: Judgment normal.    Assessment and Plan: 1. Positive self-administered antigen test for COVID-19 (Primary) Medications as below. Discussed at home and OTC treatments to continue. Force fluids, isolation precautions, tyelnol as needed for fever and pain, and humidifier . - molnupiravir EUA (LAGEVRIO) 200 mg CAPS capsule; Take 4 capsules (800 mg total) by mouth 2 (two) times daily for 5 days.  Dispense: 40 capsule; Refill: 0 - promethazine-dextromethorphan (PROMETHAZINE-DM) 6.25-15 MG/5ML syrup; Take 5 mLs by mouth 3 (three) times daily as needed for cough.  Dispense: 240 mL; Refill: 0  Appt time 09/05/2023 04:30 PM Call duration: 00:03:47  Follow Up Instructions: Return if symptoms worsen or fail to improve.   I discussed the assessment and treatment plan with the patient. The patient was provided an opportunity to ask questions, and all were answered. The patient agreed with the plan and demonstrated an understanding of the instructions.   The patient was advised to call back or seek an in-person evaluation if the symptoms worsen or if the condition fails to improve as anticipated.  The above assessment and management plan was discussed with the patient. The patient verbalized understanding of and has agreed to the management plan.   Neale Burly, DNP-FNP [facility] [address] [place], [place] [address] [phone]

## 2023-10-01 ENCOUNTER — Telehealth: Payer: Self-pay | Admitting: Family

## 2023-10-01 DIAGNOSIS — F411 Generalized anxiety disorder: Secondary | ICD-10-CM

## 2023-10-01 DIAGNOSIS — Z8659 Personal history of other mental and behavioral disorders: Secondary | ICD-10-CM

## 2023-10-01 DIAGNOSIS — E039 Hypothyroidism, unspecified: Secondary | ICD-10-CM

## 2023-10-01 DIAGNOSIS — G4700 Insomnia, unspecified: Secondary | ICD-10-CM

## 2023-10-01 NOTE — Telephone Encounter (Signed)
  Prescription Request  10/01/2023  Is this a "Controlled Substance" medicine? no  Have you seen your PCP in the last 2 weeks? no  If YES, route message to pool  -  If NO, patient needs to be scheduled for appointment.  What is the name of the medication or equipment? sertraline (ZOLOFT) 100 MG tablet Protonoix 20mg  levothyroxine (SYNTHROID) 50 MCG tablet   Have you contacted your pharmacy to request a refill? no  Which pharmacy would you like this sent to? [facility]   Patient notified that their request is being sent to the clinical staff for review and that they should receive a response within 2 business days.

## 2023-10-02 ENCOUNTER — Other Ambulatory Visit (HOSPITAL_COMMUNITY): Payer: Self-pay

## 2023-10-02 MED ORDER — LEVOTHYROXINE SODIUM 50 MCG PO TABS
50.0000 ug | ORAL_TABLET | Freq: Every day | ORAL | 3 refills | Status: DC
  Filled 2023-10-02: qty 90, 90d supply, fill #0
  Filled 2024-02-08: qty 90, 90d supply, fill #1

## 2023-10-02 MED ORDER — SERTRALINE HCL 100 MG PO TABS
100.0000 mg | ORAL_TABLET | Freq: Every day | ORAL | 1 refills | Status: DC
  Filled 2023-10-02: qty 90, 90d supply, fill #0
  Filled 2024-02-08: qty 90, 90d supply, fill #1

## 2023-10-02 NOTE — Telephone Encounter (Signed)
Refill sent.

## 2023-10-02 NOTE — Telephone Encounter (Signed)
Patient aware.

## 2023-10-05 ENCOUNTER — Ambulatory Visit
Admission: RE | Admit: 2023-10-05 | Discharge: 2023-10-05 | Disposition: A | Discharge status: Home / Self Care | Payer: 59 | Source: Ambulatory Visit | Attending: Emergency Medicine | Admitting: Emergency Medicine

## 2023-10-05 VITALS — BP 102/60 | HR 97 | Temp 98.5°F | Resp 17

## 2023-10-05 DIAGNOSIS — J101 Influenza due to other identified influenza virus with other respiratory manifestations: Secondary | ICD-10-CM

## 2023-10-05 DIAGNOSIS — J208 Acute bronchitis due to other specified organisms: Secondary | ICD-10-CM

## 2023-10-05 HISTORY — DX: Irritable bowel syndrome, unspecified: K58.9

## 2023-10-05 LAB — POCT INFLUENZA A/B
Influenza A, POC: POSITIVE — AB
Influenza B, POC: NEGATIVE

## 2023-10-05 MED ORDER — DEXAMETHASONE 6 MG PO TABS
6.0000 mg | ORAL_TABLET | Freq: Two times a day (BID) | ORAL | 0 refills | Status: DC
Start: 2023-10-05 — End: 2023-10-10

## 2023-10-05 MED ORDER — LEVALBUTEROL TARTRATE 45 MCG/ACT IN AERO: 2.0000 | INHALATION_SPRAY | Freq: Four times a day (QID) | RESPIRATORY_TRACT | 2 refills | Status: DC | PRN

## 2023-10-05 MED ORDER — CETIRIZINE HCL 10 MG PO TABS: 10.0000 mg | ORAL_TABLET | Freq: Every day | ORAL | 1 refills | Status: AC

## 2023-10-05 MED ORDER — GUAIFENESIN 400 MG PO TABS
ORAL_TABLET | ORAL | 0 refills | Status: DC
Start: 2023-10-05 — End: 2023-12-16

## 2023-10-05 MED ORDER — PROMETHAZINE-DM 6.25-15 MG/5ML PO SYRP: 5.0000 mL | ORAL_SOLUTION | Freq: Every evening | ORAL | 0 refills | Status: DC | PRN

## 2023-10-05 MED ORDER — FLUTICASONE PROPIONATE 50 MCG/ACT NA SUSP
1.0000 | Freq: Every day | NASAL | 2 refills | Status: DC
Start: 2023-10-05 — End: 2024-04-13

## 2023-10-05 NOTE — Discharge Instructions (Signed)
Your rapid influenza antigen test today was positive.  Due to the duration of your symptoms, you would no longer benefit from antiviral therapy for influenza.      Please read below to learn more about the medications, dosages and frequencies that I recommend to help alleviate your symptoms and to get you feeling better soon:   Zyrtec (cetirizine): This is an excellent second-generation antihistamine that helps to reduce respiratory inflammatory response to environmental allergens.  In some patients, this medication can cause daytime sleepiness so I recommend that you take 1 tablet daily at bedtime.     Flonase (fluticasone): This is a steroid nasal spray that used once daily, 1 spray in each nare.  This works best when used on a daily basis. This medication does not work well if it is only used when you think you need it.  After 3 to 5 days of use, you will notice significant reduction of the inflammation and mucus production that is currently being caused by exposure to allergens, whether seasonal or environmental.  The most common side effect of this medication is nosebleeds.  If you experience a nosebleed, please discontinue use for 1 week, then feel free to resume.  If you find that your insurance will not pay for this medication, please consider a different nasal steroids such as Nasonex (mometasone), or Nasacort (triamcinolone).   Xopenex (levalbuterol): This inhaled medication contains a short acting beta agonist bronchodilator.  This medication relaxes the smooth muscle of the airway in the lungs.  When these muscles are tight, breathing becomes more constricted.  The result of relaxation of the smooth muscle is increased air movement and improved work of breathing.  This is a short acting medication that can be used every 4-6 hours as needed for increased work of breathing, shortness of breath, wheezing and excessive coughing.  It comes in the form of a handheld inhaler or nebulizer solution.  I  recommended that for the next 3 to 4 days, this medication is used 4 times daily on a scheduled basis then decrease to twice daily and as needed until symptoms have completely resolved which I anticipate will be several weeks.   Robitussin, Mucinex (guaifenesin): This is a daytime expectorant.  This single symptom reliever helps break up chest congestion and loosen up thick nasal drainage making phlegm and drainage easier to cough up and to blow out from your nose.  I recommend taking 400 mg in either liquid or tablet form three times daily as needed.  I do not recommend the 12-hour extended relief version or doses higher than 400 mg per each dose as these often make some patients feel jittery or jumpy and can interfere with sleep.  I also do not recommend that you purchase guaifenesin with the ingredient " DM" which is dextromethorphan, a cough suppressant which I only recommend taking at bedtime.  Guaifenesin 400 mg is a safe dose for people who are being treated for high blood pressure.     Promethazine DM: Promethazine is both a nasal decongestant that dries up mucous membranes and an antinausea medication.  Promethazine often makes most patients feel fairly sleepy.  "DM" is dextromethorphan, a single symptom reliever which is a cough suppressant found in many over-the-counter cough medications and combination cold preparations.  Please take 5 mL before bedtime to minimize your cough which will help you sleep better.  I have sent a prescription for this medication to your pharmacy because it cannot be purchased over-the-counter.  If symptoms have not meaningfully improved in the next 5 to 7 days, please return for repeat evaluation or follow-up with your regular provider.  If symptoms have worsened in the next 3 to 5 days, please go to the emergency room for further evaluation.    Thank you for visiting urgent care today.  We appreciate the opportunity to participate in your care.

## 2023-10-05 NOTE — ED Triage Notes (Signed)
Pt reports had nonproductive cough since Thursday. Believes had fevers. Took Motrin and Nyquil and Mucinex.

## 2023-10-05 NOTE — ED Provider Notes (Signed)
[facility]    CSN: [id number] Arrival date & time: 10/05/23  4098    HISTORY   Chief Complaint  Patient presents with   Cough    My cough started Thursday night and fever started Friday. I feel like I have the flu - Entered by patient   HPI Katherine Hays is a pleasant, 52 y.o. female who presents to urgent care today. Patient complains of deep, painful, nonproductive cough, fever, Tmax 101, nasal congestion, rhinorrhea, body aches, chills for the past 4 days.  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  Patient reports having COVID-19 3 weeks ago.  Patient states she has been taking Motrin, NyQuil and Mucinex without much relief of her symptoms.  Patient states she has been prescribed Zyrtec, Flonase and albuterol for bronchitis in the past.  Patient denies a history of smoking, asthma, allergies.  The history is provided by the patient.   Past Medical History:  Diagnosis Date   Anxiety    Arthritis    in neck, hip and knee   Asthma    GERD (gastroesophageal reflux disease)    Headache    migraines   IBS (irritable bowel syndrome)    Nerve pain    right leg   OCD (obsessive compulsive disorder)    PONV (postoperative nausea and vomiting)    Thyroid disease    hypo   Patient Active Problem List   Diagnosis Date Noted   Raynaud's disease without gangrene 08/11/2023   Low hemoglobin 11/15/2020   Controlled substance agreement signed 02/04/2019   Benzodiazepine dependence (HCC) 02/04/2019   Gastroesophageal reflux disease 02/04/2019   Vitamin D deficiency 11/13/2017   Hypothyroidism 11/13/2017   Hyperlipemia 11/13/2017   Insomnia 11/10/2017   History of OCD (obsessive compulsive disorder) 01/12/2014   GAD (generalized anxiety disorder) 01/12/2014   Inguinal lymphadenopathy 05/26/2012   Past Surgical History:  Procedure Laterality Date   CHOLECYSTECTOMY N/A 05/19/2023   Procedure: LAPAROSCOPIC  CHOLECYSTECTOMY;  Surgeon: Quentin Ore, MD;  Location: [facility];  Service: General;  Laterality: N/A;   ROTATOR CUFF REPAIR Left 10/22/2021   TONSILLECTOMY     TUBAL LIGATION     WISDOM TOOTH EXTRACTION     OB History     Gravida  4   Para  3   Term      Preterm      AB  1   Living         SAB  1   IAB      Ectopic      Multiple      Live Births             Home Medications    Prior to Admission medications   Medication Sig Start Date End Date Taking? Authorizing Provider  albuterol (VENTOLIN HFA) 108 (90 Base) MCG/ACT inhaler Inhale 2 puffs into the lungs every 6 (six) hours as needed for wheezing or shortness of breath. As needed 06/11/23   Junie Spencer, FNP  ALPRAZolam Prudy Feeler) 0.5 MG tablet Take 1 tablet (0.5 mg total) by mouth 2 (two) times daily. 04/18/23   Jannifer Rodney A, FNP  benzonatate (TESSALON) 200 MG capsule Take 1 capsule (200 mg total) by mouth 3 (three) times daily as needed. 06/09/23   Junie Spencer, FNP  cetirizine (ZYRTEC ALLERGY) 10 MG tablet Take 1 tablet (10 mg total) by mouth daily. Patient not taking: Reported on 06/09/2023  05/30/23   Junie Spencer, FNP  dicyclomine (BENTYL) 10 MG capsule Take 1 capsule (10 mg total) by mouth 2 (two) times daily. Take 30 minutes before breakfast and dinner 03/18/23   Arnaldo Natal, NP  famotidine (PEPCID) 20 MG tablet Take 1 tablet (20 mg total) by mouth 2 (two) times daily. 03/18/23   Arnaldo Natal, NP  fluticasone (FLONASE) 50 MCG/ACT nasal spray Place 2 sprays into both nostrils daily. 05/30/23   Jannifer Rodney A, FNP  ibuprofen (ADVIL) 200 MG tablet Take 400-600 mg by mouth every 6 (six) hours as needed for moderate pain.    [provider]  levonorgestrel (MIRENA) 20 MCG/24HR IUD 1 each by Intrauterine route once.    [provider]  levothyroxine (SYNTHROID) 50 MCG tablet Take 1 tablet (50 mcg total) by mouth daily before breakfast. 10/02/23   Jannifer Rodney A, FNP  NIFEdipine (PROCARDIA-XL/NIFEDICAL-XL) 30 MG 24 hr tablet Take 1 tablet (30 mg total) by mouth daily. 08/11/23   Daphine Deutscher, Mary-Margaret, FNP  omeprazole (PRILOSEC) 40 MG capsule Take 1 capsule (40 mg) by mouth 2 times daily. 12/09/22   Imogene Burn, MD  oxyCODONE-acetaminophen (PERCOCET) 5-325 MG tablet Take 1 tablet by mouth every 4 (four) hours as needed for severe pain. 05/19/23 05/18/24  Stechschulte, Hyman Hopes, MD  predniSONE (STERAPRED UNI-PAK 21 TAB) 10 MG (21) TBPK tablet Use as directed 06/09/23   Jannifer Rodney A, FNP  promethazine-dextromethorphan (PROMETHAZINE-DM) 6.25-15 MG/5ML syrup Take 5 mLs by mouth 3 (three) times daily as needed for cough. 09/05/23   Milian, Aleen Campi, FNP  sertraline (ZOLOFT) 100 MG tablet Take 1 tablet (100 mg total) by mouth daily. 10/02/23   Junie Spencer, FNP  traZODone (DESYREL) 100 MG tablet Take 1 tablet (100 mg total) by mouth at bedtime. 04/18/23   Junie Spencer, FNP    Family History Family History  Problem Relation Age of Onset   Hyperthyroidism Mother    Liver disease Father        cirrhosis   Hypertension Brother    Diabetes Maternal Grandfather    Stroke Maternal Grandfather    Breast cancer Paternal Grandmother    Cancer Paternal Grandmother        breast   Heart disease Paternal Grandfather    Stroke Paternal Grandfather    Pancreatic cancer Maternal Uncle    Crohn's disease Other        Nephew   Colon cancer Neg Hx    Stomach cancer Neg Hx    Esophageal cancer Neg Hx    Social History Social History   Tobacco Use   Smoking status: Never   Smokeless tobacco: Never  Vaping Use   Vaping status: Never Used  Substance Use Topics   Alcohol use: No   Drug use: No   Allergies   Codeine  Review of Systems Review of Systems Pertinent findings revealed after performing a 14 point review of systems has been noted in the history of present illness.  Physical Exam Vital Signs BP 102/60 (BP Location: Right  Arm)   Pulse 97   Temp 98.5 F (36.9 C) (Oral)   Resp 17   SpO2 96%   No data found.  Physical Exam Vitals and nursing note reviewed.  Constitutional:      Appearance: She is ill-appearing.  HENT:     Head: Normocephalic and atraumatic.     Salivary Glands: Right salivary gland is not diffusely enlarged or tender. Left salivary  gland is not diffusely enlarged or tender.     Right Ear: Tympanic membrane, ear canal and external ear normal.     Left Ear: Tympanic membrane, ear canal and external ear normal.     Nose: Congestion and rhinorrhea present. Rhinorrhea is clear.     Right Sinus: No maxillary sinus tenderness or frontal sinus tenderness.     Left Sinus: No maxillary sinus tenderness.     Mouth/Throat:     Mouth: Mucous membranes are moist.     Pharynx: Pharyngeal swelling, posterior oropharyngeal erythema and uvula swelling present.     Tonsils: No tonsillar exudate. 0 on the right. 0 on the left.  Cardiovascular:     Rate and Rhythm: Normal rate and regular rhythm.     Pulses: Normal pulses.  Pulmonary:     Effort: Pulmonary effort is normal. No accessory muscle usage, prolonged expiration or respiratory distress.     Breath sounds: No stridor. Examination of the right-middle field reveals rhonchi. Examination of the left-middle field reveals rhonchi. Examination of the right-lower field reveals rhonchi. Examination of the left-lower field reveals rhonchi. Rhonchi present. No wheezing or rales.  Abdominal:     General: Abdomen is flat. Bowel sounds are normal.     Palpations: Abdomen is soft.  Musculoskeletal:        General: Normal range of motion.  Lymphadenopathy:     Cervical: Cervical adenopathy present.     Right cervical: Superficial cervical adenopathy and posterior cervical adenopathy present.     Left cervical: Superficial cervical adenopathy and posterior cervical adenopathy present.  Skin:    General: Skin is warm and dry.  Neurological:     General: No  focal deficit present.     Mental Status: She is alert and oriented to person, place, and time.     Motor: Motor function is intact.     Coordination: Coordination is intact.     Gait: Gait is intact.     Deep Tendon Reflexes: Reflexes are normal and symmetric.  Psychiatric:        Attention and Perception: Attention and perception normal.        Mood and Affect: Mood and affect normal.        Speech: Speech normal.        Behavior: Behavior normal. Behavior is cooperative.        Thought Content: Thought content normal.     Visual Acuity Right Eye Distance:   Left Eye Distance:   Bilateral Distance:    Right Eye Near:   Left Eye Near:    Bilateral Near:     UC Couse / Diagnostics / Procedures:     Radiology No results found.  Procedures Procedures (including critical care time) EKG  Pending results:  Labs Reviewed  POCT INFLUENZA A/B - Abnormal; Notable for the following components:      Result Value   Influenza A, POC Positive (*)    All other components within normal limits    Medications Ordered in UC: Medications - No data to display  UC Diagnoses / Final Clinical Impressions(s)   I have reviewed the triage vital signs and the nursing notes.  Pertinent labs & imaging results that were available during my care of the patient were reviewed by me and considered in my medical decision making (see chart for details).    Final diagnoses:  Influenza A  Acute viral bronchitis   Influenza test today was positive.  Patient also exhibiting signs  of bronchitis.  Patient provided with renewals of Zyrtec, Flonase and albuterol.  Patient would no longer benefit from Tamiflu due to duration of symptoms.  Patient provided with Mucinex for daytime cough to promote expectoration and Promethazine DM for nighttime cough so she can get some sleep.  Also provided patient with dexamethasone to calm respiratory inflammation.  Conservative care recommended.  Return precautions  advised.  Please see discharge instructions below for details of plan of care as provided to patient. ED Prescriptions     Medication Sig Dispense Auth. Provider   cetirizine (ZYRTEC ALLERGY) 10 MG tablet Take 1 tablet (10 mg total) by mouth at bedtime. 90 tablet Theadora Rama Scales, PA-C   fluticasone (FLONASE) 50 MCG/ACT nasal spray Place 1 spray into both nostrils daily. Begin by using 2 sprays in each nare daily for 3 to 5 days, then decrease to 1 spray in each nare daily. 15.8 mL Theadora Rama Scales, PA-C   dexamethasone (DECADRON) 6 MG tablet Take 1 tablet (6 mg total) by mouth 2 (two) times daily with a meal for 5 days. 10 tablet Theadora Rama Scales, PA-C   guaifenesin (HUMIBID E) 400 MG TABS tablet Take 1 tablet 3 times daily as needed for chest congestion and cough 30 tablet Theadora Rama Scales, PA-C   promethazine-dextromethorphan (PROMETHAZINE-DM) 6.25-15 MG/5ML syrup Take 5 mLs by mouth at bedtime as needed for cough. 60 mL Theadora Rama Scales, PA-C   levalbuterol [facility] HFA) 45 MCG/ACT inhaler Inhale 2 puffs into the lungs every 6 (six) hours as needed for wheezing. 15 g Theadora Rama Scales, PA-C      PDMP not reviewed this encounter.  Pending results:  Labs Reviewed  POCT INFLUENZA A/B - Abnormal; Notable for the following components:      Result Value   Influenza A, POC Positive (*)    All other components within normal limits      Discharge Instructions      Your rapid influenza antigen test today was positive.  Due to the duration of your symptoms, you would no longer benefit from antiviral therapy for influenza.      Please read below to learn more about the medications, dosages and frequencies that I recommend to help alleviate your symptoms and to get you feeling better soon:   Zyrtec (cetirizine): This is an excellent second-generation antihistamine that helps to reduce respiratory inflammatory response to environmental allergens.  In some  patients, this medication can cause daytime sleepiness so I recommend that you take 1 tablet daily at bedtime.     Flonase (fluticasone): This is a steroid nasal spray that used once daily, 1 spray in each nare.  This works best when used on a daily basis. This medication does not work well if it is only used when you think you need it.  After 3 to 5 days of use, you will notice significant reduction of the inflammation and mucus production that is currently being caused by exposure to allergens, whether seasonal or environmental.  The most common side effect of this medication is nosebleeds.  If you experience a nosebleed, please discontinue use for 1 week, then feel free to resume.  If you find that your insurance will not pay for this medication, please consider a different nasal steroids such as Nasonex (mometasone), or Nasacort (triamcinolone).   Xopenex (levalbuterol): This inhaled medication contains a short acting beta agonist bronchodilator.  This medication relaxes the smooth muscle of the airway in the lungs.  When these muscles  are tight, breathing becomes more constricted.  The result of relaxation of the smooth muscle is increased air movement and improved work of breathing.  This is a short acting medication that can be used every 4-6 hours as needed for increased work of breathing, shortness of breath, wheezing and excessive coughing.  It comes in the form of a handheld inhaler or nebulizer solution.  I recommended that for the next 3 to 4 days, this medication is used 4 times daily on a scheduled basis then decrease to twice daily and as needed until symptoms have completely resolved which I anticipate will be several weeks.   Robitussin, Mucinex (guaifenesin): This is a daytime expectorant.  This single symptom reliever helps break up chest congestion and loosen up thick nasal drainage making phlegm and drainage easier to cough up and to blow out from your nose.  I recommend taking 400 mg in  either liquid or tablet form three times daily as needed.  I do not recommend the 12-hour extended relief version or doses higher than 400 mg per each dose as these often make some patients feel jittery or jumpy and can interfere with sleep.  I also do not recommend that you purchase guaifenesin with the ingredient " DM" which is dextromethorphan, a cough suppressant which I only recommend taking at bedtime.  Guaifenesin 400 mg is a safe dose for people who are being treated for high blood pressure.     Promethazine DM: Promethazine is both a nasal decongestant that dries up mucous membranes and an antinausea medication.  Promethazine often makes most patients feel fairly sleepy.  "DM" is dextromethorphan, a single symptom reliever which is a cough suppressant found in many over-the-counter cough medications and combination cold preparations.  Please take 5 mL before bedtime to minimize your cough which will help you sleep better.  I have sent a prescription for this medication to your pharmacy because it cannot be purchased over-the-counter.   If symptoms have not meaningfully improved in the next 5 to 7 days, please return for repeat evaluation or follow-up with your regular provider.  If symptoms have worsened in the next 3 to 5 days, please go to the emergency room for further evaluation.    Thank you for visiting urgent care today.  We appreciate the opportunity to participate in your care.       Disposition Upon Discharge:  Condition: stable for discharge home  Patient presented with an acute illness with associated systemic symptoms and significant discomfort requiring urgent management. In my opinion, this is a condition that a prudent lay person (someone who possesses an average knowledge of health and medicine) may potentially expect to result in complications if not addressed urgently such as respiratory distress, impairment of bodily function or dysfunction of bodily organs.   Routine  symptom specific, illness specific and/or disease specific instructions were discussed with the patient and/or caregiver at length.   As such, the patient has been evaluated and assessed, work-up was performed and treatment was provided in alignment with urgent care protocols and evidence based medicine.  Patient/parent/caregiver has been advised that the patient may require follow up for further testing and treatment if the symptoms continue in spite of treatment, as clinically indicated and appropriate.  Patient/parent/caregiver has been advised to return to the [facility] or PCP if no better; to PCP or the Emergency Department if new signs and symptoms develop, or if the current signs or symptoms continue to change or worsen for further workup,  evaluation and treatment as clinically indicated and appropriate  The patient will follow up with their current PCP if and as advised. If the patient does not currently have a PCP we will assist them in obtaining one.   The patient may need specialty follow up if the symptoms continue, in spite of conservative treatment and management, for further workup, evaluation, consultation and treatment as clinically indicated and appropriate.  Patient/parent/caregiver verbalized understanding and agreement of plan as discussed.  All questions were addressed during visit.  Please see discharge instructions below for further details of plan.  This office note has been dictated using Teaching laboratory technician.  Unfortunately, this method of dictation can sometimes lead to typographical or grammatical errors.  I apologize for your inconvenience in advance if this occurs.  Please do not hesitate to reach out to me if clarification is needed.      Theadora Rama Scales, [place] 10/05/23 [phone]

## 2023-10-06 ENCOUNTER — Ambulatory Visit: Payer: 59

## 2023-10-09 ENCOUNTER — Ambulatory Visit: Payer: 59 | Admitting: Family

## 2023-10-09 ENCOUNTER — Encounter: Payer: Self-pay | Admitting: Family

## 2023-10-09 VITALS — BP 128/77 | HR 71 | Temp 96.2°F | Ht 67.0 in | Wt 166.0 lb

## 2023-10-09 DIAGNOSIS — J208 Acute bronchitis due to other specified organisms: Secondary | ICD-10-CM | POA: Diagnosis not present

## 2023-10-09 DIAGNOSIS — J111 Influenza due to unidentified influenza virus with other respiratory manifestations: Secondary | ICD-10-CM | POA: Diagnosis not present

## 2023-10-09 DIAGNOSIS — B9689 Other specified bacterial agents as the cause of diseases classified elsewhere: Secondary | ICD-10-CM

## 2023-10-09 MED ORDER — METHYLPREDNISOLONE ACETATE 80 MG/ML IJ SUSP
80.0000 mg | Freq: Once | INTRAMUSCULAR | Status: AC
  Administered 2023-10-09: 80 mg via INTRAMUSCULAR

## 2023-10-09 MED ORDER — AZITHROMYCIN 250 MG PO TABS: ORAL_TABLET | ORAL | 0 refills | Status: DC

## 2023-10-09 MED ORDER — ALBUTEROL SULFATE HFA 108 (90 BASE) MCG/ACT IN AERS: 2.0000 | INHALATION_SPRAY | Freq: Four times a day (QID) | RESPIRATORY_TRACT | 0 refills | Status: DC | PRN

## 2023-10-09 NOTE — Patient Instructions (Signed)

## 2023-10-09 NOTE — Progress Notes (Signed)
Subjective:    Patient ID: Katherine Hays, female    DOB: Oct 04, 1970, 53 y.o.   MRN: [id number]  Chief Complaint  Patient presents with   URI   PT presents to the office today with cough that started 10/03/23. She was diagnosed with flu on 10/05/23 and was given zyrtec, flonase, mucinex, promethazine DM, and dexamethasone. She states she continues to have a dry cough and feels horrible.  Cough This is a new problem. The current episode started 1 to 4 weeks ago. The problem has been gradually improving. The problem occurs every few minutes. The cough is Non-productive. Associated symptoms include a fever (99.1 F), myalgias, nasal congestion, a sore throat and wheezing. Pertinent negatives include no chills, ear congestion, ear pain, headaches, postnasal drip or shortness of breath. Associated symptoms comments: Weakness . She has tried rest, OTC cough suppressant, prescription cough suppressant and oral steroids for the symptoms.      Review of Systems  Constitutional:  Positive for fever (99.1 F). Negative for chills.  HENT:  Positive for sore throat. Negative for ear pain and postnasal drip.   Respiratory:  Positive for cough and wheezing. Negative for shortness of breath.   Musculoskeletal:  Positive for myalgias.  Neurological:  Negative for headaches.  All other systems reviewed and are negative.   Social History   Socioeconomic History   Marital status: Married    Spouse name: Not on file   Number of children: 3   Years of education: Not on file   Highest education level: Not on file  Occupational History   Occupation: Forensic scientist: [employer]  Tobacco Use   Smoking status: Never   Smokeless tobacco: Never  Vaping Use   Vaping status: Never Used  Substance and Sexual Activity   Alcohol use: No   Drug use: No   Sexual activity: Yes  Other Topics Concern   Not on file  Social History Narrative   Not on file   Social Drivers of Health    Financial Resource Strain: Not on file  Food Insecurity: Not on file  Transportation Needs: Not on file  Physical Activity: Not on file  Stress: Not on file  Social Connections: Not on file   Family History  Problem Relation Age of Onset   Hyperthyroidism Mother    Liver disease Father        cirrhosis   Hypertension Brother    Diabetes Maternal Grandfather    Stroke Maternal Grandfather    Breast cancer Paternal Grandmother    Cancer Paternal Grandmother        breast   Heart disease Paternal Grandfather    Stroke Paternal Grandfather    Pancreatic cancer Maternal Uncle    Crohn's disease Other        Nephew   Colon cancer Neg Hx    Stomach cancer Neg Hx    Esophageal cancer Neg Hx         Objective:   Physical Exam Vitals reviewed.  Constitutional:      General: She is not in acute distress.    Appearance: She is well-developed.  HENT:     Head: Normocephalic and atraumatic.     Right Ear: Tympanic membrane normal.     Left Ear: Tympanic membrane normal.  Eyes:     Pupils: Pupils are equal, round, and reactive to light.  Neck:     Thyroid: No thyromegaly.  Cardiovascular:     Rate  and Rhythm: Normal rate and regular rhythm.     Heart sounds: No murmur heard. Pulmonary:     Effort: Pulmonary effort is normal. No respiratory distress.     Breath sounds: Normal breath sounds. No wheezing.     Comments: Coarse nonproductive cough Abdominal:     General: Bowel sounds are normal. There is no distension.     Palpations: Abdomen is soft.     Tenderness: There is no abdominal tenderness.  Musculoskeletal:        General: No tenderness. Normal range of motion.     Cervical back: Normal range of motion and neck supple.  Skin:    General: Skin is warm and dry.  Neurological:     Mental Status: She is alert and oriented to person, place, and time.     Cranial Nerves: No cranial nerve deficit.     Deep Tendon Reflexes: Reflexes are normal and symmetric.   Psychiatric:        Behavior: Behavior normal.        Thought Content: Thought content normal.        Judgment: Judgment normal.       BP 128/77   Pulse 71   Temp (!) 96.2 F (35.7 C)   Ht 5\' 7"  (1.702 m)   Wt 166 lb (75.3 kg)   SpO2 99%   BMI 26.00 kg/m      Assessment & Plan:  Katherine Hays comes in today with chief complaint of URI   Diagnosis and orders addressed:  1. Influenza (Primary) - methylPREDNISolone acetate (DEPO-MEDROL) injection 80 mg  2. Acute bacterial bronchitis Given continued low grade fever will treat with Zpak.  - Take meds as prescribed - Use a cool mist humidifier  -Use saline nose sprays frequently -Force fluids -For any cough or congestion  Use plain Mucinex- regular strength or max strength is fine -For fever or aces or pains- take tylenol or ibuprofen. -Throat lozenges if help -Follow up if symptoms worsen or do not improve  - azithromycin (ZITHROMAX) 250 MG tablet; Take 500 mg once, then 250 mg for four days  Dispense: 6 tablet; Refill: 0 - methylPREDNISolone acetate (DEPO-MEDROL) injection 80 mg - albuterol (VENTOLIN HFA) 108 (90 Base) MCG/ACT inhaler; Inhale 2 puffs into the lungs every 6 (six) hours as needed for wheezing or shortness of breath.  Dispense: 8 g; Refill: 0    Jannifer Rodney, FNP

## 2023-10-13 ENCOUNTER — Ambulatory Visit (INDEPENDENT_AMBULATORY_CARE_PROVIDER_SITE_OTHER): Payer: 59 | Admitting: Family Medicine

## 2023-10-13 ENCOUNTER — Encounter: Payer: Self-pay | Admitting: Family

## 2023-10-13 ENCOUNTER — Ambulatory Visit (INDEPENDENT_AMBULATORY_CARE_PROVIDER_SITE_OTHER): Payer: 59

## 2023-10-13 ENCOUNTER — Encounter: Payer: Self-pay | Admitting: Family Medicine

## 2023-10-13 VITALS — BP 113/72 | HR 93 | Temp 97.0°F | Ht 67.0 in | Wt 166.0 lb

## 2023-10-13 DIAGNOSIS — R053 Chronic cough: Secondary | ICD-10-CM

## 2023-10-13 DIAGNOSIS — J208 Acute bronchitis due to other specified organisms: Secondary | ICD-10-CM

## 2023-10-13 DIAGNOSIS — B9689 Other specified bacterial agents as the cause of diseases classified elsewhere: Secondary | ICD-10-CM

## 2023-10-13 DIAGNOSIS — J40 Bronchitis, not specified as acute or chronic: Secondary | ICD-10-CM

## 2023-10-13 MED ORDER — CEFDINIR 300 MG PO CAPS: 300.0000 mg | ORAL_CAPSULE | Freq: Two times a day (BID) | ORAL | 0 refills | Status: DC

## 2023-10-13 MED ORDER — PREDNISONE 20 MG PO TABS: ORAL_TABLET | ORAL | 0 refills | Status: DC

## 2023-10-13 MED ORDER — ALBUTEROL SULFATE HFA 108 (90 BASE) MCG/ACT IN AERS: 2.0000 | INHALATION_SPRAY | Freq: Four times a day (QID) | RESPIRATORY_TRACT | 0 refills | Status: DC | PRN

## 2023-10-13 NOTE — Progress Notes (Signed)
 BP 113/72   Pulse 93   Temp (!) 97 F (36.1 C)   Ht 5\' 7"  (1.702 m)   Wt 166 lb (75.3 kg)   SpO2 98%   BMI 26.00 kg/m    Subjective:   Patient ID: Harrel Carina, female    DOB: March 03, 1971, 53 y.o.   MRN: [id number]  HPI: LASHONDA SONNEBORN is a 53 y.o. female presenting on 10/13/2023 for persistent cough   HPI Cough and chest congestion Patient is coming in today for cough and chest congestion that she feels has been worsening.  She denies any shortness of breath.  She does feel like she has a little bit of rattling in her chest and then she has lower chest rib pain from all of the coughing.  It hurts worse with coughing and deep inspiration..  She does feel like some achiness and fatigue.  Her cough has been mostly dry.  She has been using albuterol and Delsym and prescribed cough syrup and Mucinex and Flonase and they are may be helping a little bit  Relevant past medical, surgical, family and social history reviewed and updated as indicated. Interim medical history since our last visit reviewed. Allergies and medications reviewed and updated.  Review of Systems  Constitutional:  Negative for chills and fever.  HENT:  Positive for congestion. Negative for ear discharge and ear pain.   Eyes:  Negative for visual disturbance.  Respiratory:  Positive for cough. Negative for chest tightness, shortness of breath and wheezing.   Cardiovascular:  Negative for chest pain and leg swelling.  Genitourinary:  Negative for difficulty urinating and dysuria.  Musculoskeletal:  Negative for back pain and gait problem.  Skin:  Negative for rash.  Neurological:  Negative for light-headedness and headaches.  Psychiatric/Behavioral:  Negative for agitation and behavioral problems.   All other systems reviewed and are negative.   Per HPI unless specifically indicated above   Allergies as of 10/13/2023       Reactions   Codeine Itching        Medication List        Accurate as  of October 13, 2023  2:54 PM. If you have any questions, ask your nurse or doctor.          STOP taking these medications    azithromycin 250 MG tablet Commonly known as: ZITHROMAX Stopped by: Elige Radon Shakeya Kerkman   levalbuterol 45 MCG/ACT inhaler Commonly known as: XOPENEX HFA Stopped by: Elige Radon Johney Perotti       TAKE these medications    albuterol 108 (90 Base) MCG/ACT inhaler Commonly known as: VENTOLIN HFA Inhale 2 puffs into the lungs every 6 (six) hours as needed for wheezing or shortness of breath.   ALPRAZolam 0.5 MG tablet Commonly known as: XANAX Take 1 tablet (0.5 mg total) by mouth 2 (two) times daily.   benzonatate 200 MG capsule Commonly known as: TESSALON Take 1 capsule (200 mg total) by mouth 3 (three) times daily as needed.   cefdinir 300 MG capsule Commonly known as: OMNICEF Take 1 capsule (300 mg total) by mouth 2 (two) times daily. 1 po BID Started by: Elige Radon Norberta Stobaugh   cetirizine 10 MG tablet Commonly known as: ZyrTEC Allergy Take 1 tablet (10 mg total) by mouth at bedtime.   dicyclomine 10 MG capsule Commonly known as: BENTYL Take 1 capsule (10 mg total) by mouth 2 (two) times daily. Take 30 minutes before breakfast and dinner   famotidine 20  MG tablet Commonly known as: PEPCID Take 1 tablet (20 mg total) by mouth 2 (two) times daily.   fluticasone 50 MCG/ACT nasal spray Commonly known as: FLONASE Place 1 spray into both nostrils daily. Begin by using 2 sprays in each nare daily for 3 to 5 days, then decrease to 1 spray in each nare daily.   guaifenesin 400 MG Tabs tablet Commonly known as: HUMIBID E Take 1 tablet 3 times daily as needed for chest congestion and cough   ibuprofen 200 MG tablet Commonly known as: ADVIL Take 400-600 mg by mouth every 6 (six) hours as needed for moderate pain.   levonorgestrel 20 MCG/24HR IUD Commonly known as: MIRENA 1 each by Intrauterine route once.   levothyroxine 50 MCG tablet Commonly known  as: SYNTHROID Take 1 tablet (50 mcg total) by mouth daily before breakfast.   NIFEdipine 30 MG 24 hr tablet Commonly known as: PROCARDIA-XL/NIFEDICAL-XL Take 1 tablet (30 mg total) by mouth daily.   omeprazole 40 MG capsule Commonly known as: PRILOSEC Take 1 capsule (40 mg) by mouth 2 times daily.   oxyCODONE-acetaminophen 5-325 MG tablet Commonly known as: Percocet Take 1 tablet by mouth every 4 (four) hours as needed for severe pain.   predniSONE 20 MG tablet Commonly known as: DELTASONE 2 po at same time daily for 5 days Started by: Elige Radon Benford Asch   promethazine-dextromethorphan 6.25-15 MG/5ML syrup Commonly known as: PROMETHAZINE-DM Take 5 mLs by mouth at bedtime as needed for cough.   sertraline 100 MG tablet Commonly known as: ZOLOFT Take 1 tablet (100 mg total) by mouth daily.   traZODone 100 MG tablet Commonly known as: DESYREL Take 1 tablet (100 mg total) by mouth at bedtime.         Objective:   BP 113/72   Pulse 93   Temp (!) 97 F (36.1 C)   Ht 5\' 7"  (1.702 m)   Wt 166 lb (75.3 kg)   SpO2 98%   BMI 26.00 kg/m   Wt Readings from Last 3 Encounters:  10/13/23 166 lb (75.3 kg)  10/09/23 166 lb (75.3 kg)  08/11/23 156 lb (70.8 kg)    Physical Exam Vitals reviewed.  Constitutional:      General: She is not in acute distress.    Appearance: She is well-developed. She is not diaphoretic.  HENT:     Right Ear: Tympanic membrane, ear canal and external ear normal.     Left Ear: Tympanic membrane, ear canal and external ear normal.     Nose: Mucosal edema and rhinorrhea present.     Right Sinus: No maxillary sinus tenderness or frontal sinus tenderness.     Left Sinus: No maxillary sinus tenderness or frontal sinus tenderness.     Mouth/Throat:     Pharynx: Uvula midline. No oropharyngeal exudate or posterior oropharyngeal erythema.     Tonsils: No tonsillar abscesses.  Eyes:     Conjunctiva/sclera: Conjunctivae normal.  Cardiovascular:      Rate and Rhythm: Normal rate and regular rhythm.     Heart sounds: Normal heart sounds. No murmur heard. Pulmonary:     Effort: Pulmonary effort is normal. No respiratory distress.     Breath sounds: Normal breath sounds. No wheezing, rhonchi or rales.  Chest:     Chest wall: Tenderness (Lower rib tenderness) present.  Musculoskeletal:        General: No tenderness. Normal range of motion.  Skin:    General: Skin is warm and dry.  Findings: No rash.  Neurological:     Mental Status: She is alert and oriented to person, place, and time.     Coordination: Coordination normal.  Psychiatric:        Behavior: Behavior normal.       Assessment & Plan:   Problem List Items Addressed This Visit   None Visit Diagnoses       Bronchitis    -  Primary   Relevant Medications   albuterol (VENTOLIN HFA) 108 (90 Base) MCG/ACT inhaler   predniSONE (DELTASONE) 20 MG tablet   cefdinir (OMNICEF) 300 MG capsule     Persistent cough       Relevant Medications   albuterol (VENTOLIN HFA) 108 (90 Base) MCG/ACT inhaler   predniSONE (DELTASONE) 20 MG tablet   cefdinir (OMNICEF) 300 MG capsule   Other Relevant Orders   DG Chest 2 View     Acute bacterial bronchitis       Relevant Medications   albuterol (VENTOLIN HFA) 108 (90 Base) MCG/ACT inhaler   predniSONE (DELTASONE) 20 MG tablet   cefdinir (OMNICEF) 300 MG capsule     Will give albuterol prednisone and cefdinir   Follow up plan: Return if symptoms worsen or fail to improve.  Counseling provided for all of the vaccine components Orders Placed This Encounter  Procedures   DG Chest 2 View    Arville Care, MD [facility] 10/13/2023, 2:54 PM

## 2023-10-14 ENCOUNTER — Other Ambulatory Visit: Payer: Self-pay

## 2023-10-14 ENCOUNTER — Other Ambulatory Visit: Payer: Self-pay | Admitting: Family Medicine

## 2023-10-14 DIAGNOSIS — B9689 Other specified bacterial agents as the cause of diseases classified elsewhere: Secondary | ICD-10-CM

## 2023-10-14 DIAGNOSIS — R053 Chronic cough: Secondary | ICD-10-CM

## 2023-10-14 DIAGNOSIS — J40 Bronchitis, not specified as acute or chronic: Secondary | ICD-10-CM

## 2023-10-14 MED ORDER — ALBUTEROL SULFATE HFA 108 (90 BASE) MCG/ACT IN AERS
2.0000 | INHALATION_SPRAY | Freq: Four times a day (QID) | RESPIRATORY_TRACT | 0 refills | Status: DC | PRN
  Filled 2023-10-14: qty 6.7, 25d supply, fill #0

## 2023-10-22 ENCOUNTER — Encounter: Payer: Self-pay | Admitting: Family Medicine

## 2023-11-11 DIAGNOSIS — N939 Abnormal uterine and vaginal bleeding, unspecified: Secondary | ICD-10-CM | POA: Diagnosis not present

## 2023-11-11 DIAGNOSIS — Z30432 Encounter for removal of intrauterine contraceptive device: Secondary | ICD-10-CM | POA: Diagnosis not present

## 2023-11-26 ENCOUNTER — Other Ambulatory Visit (HOSPITAL_COMMUNITY): Payer: Self-pay

## 2023-12-13 ENCOUNTER — Emergency Department (HOSPITAL_COMMUNITY)
Admission: EM | Admit: 2023-12-13 | Discharge: 2023-12-13 | Disposition: A | Discharge status: Home / Self Care | Attending: Emergency Medicine | Admitting: Emergency Medicine

## 2023-12-13 ENCOUNTER — Encounter (HOSPITAL_COMMUNITY): Payer: Self-pay | Admitting: *Deleted

## 2023-12-13 ENCOUNTER — Emergency Department (HOSPITAL_COMMUNITY)

## 2023-12-13 ENCOUNTER — Other Ambulatory Visit: Payer: Self-pay

## 2023-12-13 DIAGNOSIS — Z79899 Other long term (current) drug therapy: Secondary | ICD-10-CM | POA: Diagnosis not present

## 2023-12-13 DIAGNOSIS — I10 Essential (primary) hypertension: Secondary | ICD-10-CM | POA: Diagnosis not present

## 2023-12-13 DIAGNOSIS — R112 Nausea with vomiting, unspecified: Secondary | ICD-10-CM | POA: Diagnosis not present

## 2023-12-13 DIAGNOSIS — R42 Dizziness and giddiness: Secondary | ICD-10-CM | POA: Insufficient documentation

## 2023-12-13 DIAGNOSIS — R519 Headache, unspecified: Secondary | ICD-10-CM | POA: Diagnosis not present

## 2023-12-13 HISTORY — DX: Dizziness and giddiness: R42

## 2023-12-13 LAB — COMPREHENSIVE METABOLIC PANEL WITH GFR
ALT: 15 U/L (ref 0–44)
AST: 19 U/L (ref 15–41)
Albumin: 4.5 g/dL (ref 3.5–5.0)
Alkaline Phosphatase: 72 U/L (ref 38–126)
Anion gap: 9 (ref 5–15)
BUN: 8 mg/dL (ref 6–20)
CO2: 26 mmol/L (ref 22–32)
Calcium: 9.7 mg/dL (ref 8.9–10.3)
Chloride: 104 mmol/L (ref 98–111)
Creatinine, Ser: 0.74 mg/dL (ref 0.44–1.00)
GFR, Estimated: >60 mL/min (ref >60)
Glucose, Bld: 113 mg/dL — ABNORMAL HIGH (ref 70–99)
Potassium: 3.9 mmol/L (ref 3.5–5.1)
Sodium: 139 mmol/L (ref 135–145)
Total Bilirubin: 0.9 mg/dL (ref 0.0–1.2)
Total Protein: 7.8 g/dL (ref 6.5–8.1)

## 2023-12-13 LAB — URINALYSIS, ROUTINE W REFLEX MICROSCOPIC
Bacteria, UA: NONE SEEN
Bilirubin Urine: NEGATIVE
Glucose, UA: NEGATIVE mg/dL
Hgb urine dipstick: NEGATIVE
Ketones, ur: 5 mg/dL — AB
Nitrite: NEGATIVE
Protein, ur: 100 mg/dL — AB
Specific Gravity, Urine: 1.021 (ref 1.005–1.030)
WBC, UA: >50 WBC/hpf (ref 0–5)
pH: 8 (ref 5.0–8.0)

## 2023-12-13 LAB — CBC WITH DIFFERENTIAL/PLATELET
Abs Immature Granulocytes: 0.05 10*3/uL (ref 0.00–0.07)
Basophils Absolute: 0.1 10*3/uL (ref 0.0–0.1)
Basophils Relative: 1 %
Eosinophils Absolute: 0.1 10*3/uL (ref 0.0–0.5)
Eosinophils Relative: 1 %
HCT: 44.3 % (ref 36.0–46.0)
Hemoglobin: 14.3 g/dL (ref 12.0–15.0)
Immature Granulocytes: 1 %
Lymphocytes Relative: 14 %
Lymphs Abs: 1.4 10*3/uL (ref 0.7–4.0)
MCH: 28.9 pg (ref 26.0–34.0)
MCHC: 32.3 g/dL (ref 30.0–36.0)
MCV: 89.7 fL (ref 80.0–100.0)
Monocytes Absolute: 0.7 10*3/uL (ref 0.1–1.0)
Monocytes Relative: 7 %
Neutro Abs: 7.6 10*3/uL (ref 1.7–7.7)
Neutrophils Relative %: 76 %
Platelets: 325 10*3/uL (ref 150–400)
RBC: 4.94 MIL/uL (ref 3.87–5.11)
RDW: 13.2 % (ref 11.5–15.5)
WBC: 10 10*3/uL (ref 4.0–10.5)
nRBC: 0 % (ref 0.0–0.2)

## 2023-12-13 LAB — POC URINE PREG, ED: Preg Test, Ur: NEGATIVE

## 2023-12-13 MED ORDER — METOCLOPRAMIDE HCL 5 MG/ML IJ SOLN
5.0000 mg | Freq: Once | INTRAMUSCULAR | Status: AC
  Administered 2023-12-13: 5 mg via INTRAVENOUS
  Filled 2023-12-13: qty 2

## 2023-12-13 MED ORDER — SODIUM CHLORIDE 0.9 % IV BOLUS
1000.0000 mL | Freq: Once | INTRAVENOUS | Status: AC
  Administered 2023-12-13: 1000 mL via INTRAVENOUS

## 2023-12-13 MED ORDER — ONDANSETRON HCL 4 MG/2ML IJ SOLN
4.0000 mg | Freq: Once | INTRAMUSCULAR | Status: AC
  Administered 2023-12-13: 4 mg via INTRAVENOUS
  Filled 2023-12-13: qty 2

## 2023-12-13 MED ORDER — MECLIZINE HCL 12.5 MG PO TABS
25.0000 mg | ORAL_TABLET | Freq: Once | ORAL | Status: AC
  Administered 2023-12-13: 25 mg via ORAL
  Filled 2023-12-13: qty 2

## 2023-12-13 MED ORDER — METOCLOPRAMIDE HCL 10 MG PO TABS
5.0000 mg | ORAL_TABLET | Freq: Once | ORAL | 0 refills | Status: DC | PRN
Start: 2023-12-13 — End: 2023-12-13

## 2023-12-13 MED ORDER — METOCLOPRAMIDE HCL 10 MG PO TABS: 5.0000 mg | ORAL_TABLET | Freq: Once | ORAL | 0 refills | Status: DC | PRN

## 2023-12-13 MED ORDER — ONDANSETRON 4 MG PO TBDP: 4.0000 mg | ORAL_TABLET | Freq: Three times a day (TID) | ORAL | 0 refills | Status: DC | PRN

## 2023-12-13 MED ORDER — MECLIZINE HCL 25 MG PO TABS: 25.0000 mg | ORAL_TABLET | Freq: Three times a day (TID) | ORAL | 0 refills | Status: DC | PRN

## 2023-12-13 NOTE — ED Triage Notes (Signed)
 Pt with severe dizziness since last night, dizziness gets better with laying down. Pt with hx of vertigo.

## 2023-12-13 NOTE — ED Provider Notes (Signed)
 [place] EMERGENCY DEPARTMENT AT [facility] Provider Note   CSN: [id number] Arrival date & time: 12/13/23  1700     History {Add pertinent medical, surgical, social history, OB history to HPI:1} Chief Complaint  Patient presents with   Dizziness    Katherine Hays is a 53 y.o. female.  With past medical history of anxiety, vertigo, OCD, Raynaud's disease presenting to emergency room with complaint of dizziness.  Patient has had intermittent dizziness over the past month.  She reports that specifically last night when she went to bed she was fine woke up around midnight to go to the bathroom and noticed that she was quite dizzy, felt like the room was spinning and felt like she had unsteady gait.  She became nauseous and vomited several times. She went back to sleep and woke up with similar complaint.  She is not currently dizzy but symptoms are reproducible upon standing and walking. Reports vertigo approximately 2 years ago that was similar.  She does not have any difficulty finding her words or change in vision or numbness or tingling in extremities.  Has not tried anything for symptoms. Reports recent viral illness 2 weeks ago, reports she had the flu. No current ear pain or URI like symptoms.    Dizziness      Home Medications Prior to Admission medications   Medication Sig Start Date End Date Taking? Authorizing Provider  cetirizine  (ZYRTEC  ALLERGY) 10 MG tablet Take 1 tablet (10 mg total) by mouth at bedtime. 10/05/23 04/02/24 Yes Eloise Hake Scales, PA-C  levothyroxine  (SYNTHROID ) 50 MCG tablet Take 1 tablet (50 mcg total) by mouth daily before breakfast. 10/02/23  Yes Hawks, Christy A, FNP  NIFEdipine  (PROCARDIA -XL/NIFEDICAL-XL) 30 MG 24 hr tablet Take 1 tablet (30 mg total) by mouth daily. 08/11/23  Yes Martin, Mary-Margaret, FNP  omeprazole  (PRILOSEC) 40 MG capsule Take 1 capsule (40 mg) by mouth 2 times daily. 12/09/22  Yes Daina Drum, MD  sertraline   (ZOLOFT ) 100 MG tablet Take 1 tablet (100 mg total) by mouth daily. 10/02/23  Yes Hawks, Christy A, FNP  albuterol  (VENTOLIN  HFA) 108 (90 Base) MCG/ACT inhaler Inhale 2 puffs into the lungs every 6 (six) hours as needed for wheezing or shortness of breath. 10/14/23   Tommas Fragmin A, FNP  ALPRAZolam  (XANAX ) 0.5 MG tablet Take 1 tablet (0.5 mg total) by mouth 2 (two) times daily. 04/18/23   Yevette Hem, FNP  benzonatate  (TESSALON ) 200 MG capsule Take 1 capsule (200 mg total) by mouth 3 (three) times daily as needed. 06/09/23   Yevette Hem, FNP  cefdinir  (OMNICEF ) 300 MG capsule Take 1 capsule (300 mg total) by mouth 2 (two) times daily. 1 po BID 10/13/23   Dettinger, Lucio Sabin, MD  dicyclomine  (BENTYL ) 10 MG capsule Take 1 capsule (10 mg total) by mouth 2 (two) times daily. Take 30 minutes before breakfast and dinner 03/18/23   Tory Freiberg, NP  famotidine  (PEPCID ) 20 MG tablet Take 1 tablet (20 mg total) by mouth 2 (two) times daily. 03/18/23   Kennedy-Smith, Colleen M, NP  fluticasone  (FLONASE ) 50 MCG/ACT nasal spray Place 1 spray into both nostrils daily. Begin by using 2 sprays in each nare daily for 3 to 5 days, then decrease to 1 spray in each nare daily. 10/05/23   Eloise Hake Scales, PA-C  guaifenesin  (HUMIBID E) 400 MG TABS tablet Take 1 tablet 3 times daily as needed for chest congestion and cough 10/05/23  Eloise Hake Scales, PA-C  ibuprofen  (ADVIL ) 200 MG tablet Take 400-600 mg by mouth every 6 (six) hours as needed for moderate pain.    [provider]  levonorgestrel  (MIRENA ) 20 MCG/24HR IUD 1 each by Intrauterine route once.    [provider]  oxyCODONE -acetaminophen  (PERCOCET) 5-325 MG tablet Take 1 tablet by mouth every 4 (four) hours as needed for severe pain. 05/19/23 05/18/24  Stechschulte, Avon Boers, MD  predniSONE  (DELTASONE ) 20 MG tablet 2 po at same time daily for 5 days 10/13/23   Dettinger, Lucio Sabin, MD  promethazine -dextromethorphan  (PROMETHAZINE -DM) 6.25-15 MG/5ML syrup Take 5 mLs by mouth at bedtime as needed for cough. 10/05/23   Eloise Hake Scales, PA-C  traZODone  (DESYREL ) 100 MG tablet Take 1 tablet (100 mg total) by mouth at bedtime. 04/18/23   Yevette Hem, FNP      Allergies    Codeine    Review of Systems   Review of Systems  Neurological:  Positive for dizziness.    Physical Exam Updated Vital Signs BP 132/76   Pulse 74   Temp 98.1 F (36.7 C) (Oral)   Ht 5\' 7"  (1.702 m)   Wt 74.8 kg   SpO2 100%   BMI 25.84 kg/m  Physical Exam Vitals and nursing note reviewed.  Constitutional:      General: She is not in acute distress.    Appearance: She is not toxic-appearing.  HENT:     Head: Normocephalic and atraumatic.  Eyes:     General: No scleral icterus.    Conjunctiva/sclera: Conjunctivae normal.  Cardiovascular:     Rate and Rhythm: Normal rate and regular rhythm.     Pulses: Normal pulses.     Heart sounds: Normal heart sounds.  Pulmonary:     Effort: Pulmonary effort is normal. No respiratory distress.     Breath sounds: Normal breath sounds.  Abdominal:     General: Abdomen is flat. Bowel sounds are normal.     Palpations: Abdomen is soft.     Tenderness: There is no abdominal tenderness.  Skin:    General: Skin is warm and dry.     Findings: No lesion.  Neurological:     General: No focal deficit present.     Mental Status: She is alert and oriented to person, place, and time. Mental status is at baseline.     Comments: Is alert and oriented with no slurred speech.  Pupils are equal and reactive.  No nystagmus.  No visual field deficits.  Upper and lower extremity sensation and strength intact. No ataxia with finger to nose. Dose not want to get up and stand until zofran .      ED Results / Procedures / Treatments   Labs (all labs ordered are listed, but only abnormal results are displayed) Labs Reviewed  COMPREHENSIVE METABOLIC PANEL WITH GFR  CBC WITH  DIFFERENTIAL/PLATELET  URINALYSIS, ROUTINE W REFLEX MICROSCOPIC  POC URINE PREG, ED    EKG None  Radiology No results found.  Procedures Procedures  {Document cardiac monitor, telemetry assessment procedure when appropriate:1}  Medications Ordered in ED Medications  sodium chloride  0.9 % bolus 1,000 mL (has no administration in time range)  ondansetron  (ZOFRAN ) injection 4 mg (has no administration in time range)  meclizine  (ANTIVERT ) tablet 25 mg (has no administration in time range)    ED Course/ Medical Decision Making/ A&P   {   Click here for ABCD2, HEART and other calculatorsREFRESH Note before signing :1}  Medical Decision Making Amount and/or Complexity of Data Reviewed Labs: ordered. Radiology: ordered.  Risk Prescription drug management.   This patient presents to the ED for concern of vertigo, this involves an extensive number of treatment options, and is a complaint that carries with it a high risk of complications and morbidity.  The differential diagnosis includes BPPV, meniere's disease, cerebellar stroke, migraine, dehydration, electrolyze abnormality    Co morbidities that complicate the patient evaluation  HTN Vertigo   Lab Tests:  I personally interpreted labs.  The pertinent results include:   CBC, CMP, UA and Urine preg.    Imaging Studies ordered:  I ordered imaging studies including CT head  I independently visualized and interpreted imaging which showed *** I agree with the radiologist interpretation   Cardiac Monitoring: / EKG:  The patient was maintained on a cardiac monitor.  I personally viewed and interpreted the cardiac monitored which showed an underlying rhythm of: ***   Consultations Obtained:  I requested consultation with the ***,  and discussed lab and imaging findings as well as pertinent plan - they recommend: ***   Problem List / ED Course / Critical interventions / Medication  management  Patient reporting with weeks of vertigo that has worsened in the last 24 hours associated with nausea, vomiting and room is spinning. She is alert and oriented with normal neurologic exam. No nystagmus. No dizziness at rest. She has hyperlipidemia, no other stroke risk factors. Stable and well appearing. Lung CTAB and no focal abdominal tenderness. Plan to check imaging, labs. Will give symptom control and reassess.  ***  I ordered medication including NS, zofran  and meclizine .  Reevaluation of the patient after these medicines showed that the patient improved I have reviewed the patients home medicines and have made adjustments as needed   Plan  F/u w/ PCP in 2-3d to ensure resolution of sx.  Patient was given return precautions. Patient stable for discharge at this time.  Patient educated on sx/dx and verbalized understanding of plan. Return to ER w/ new or worsening sx.    {Document critical care time when appropriate:1} {Document review of labs and clinical decision tools ie heart score, Chads2Vasc2 etc:1}  {Document your independent review of radiology images, and any outside records:1} {Document your discussion with family members, caretakers, and with consultants:1} {Document social determinants of health affecting pt's care:1} {Document your decision making why or why not admission, treatments were needed:1} Final Clinical Impression(s) / ED Diagnoses Final diagnoses:  None    Rx / DC Orders ED Discharge Orders     None

## 2023-12-13 NOTE — ED Notes (Signed)
Went over d/c paperwork at this time with patient. Pt had no questions, comments or concerns after review and verbally understood them.

## 2023-12-13 NOTE — ED Notes (Signed)
 Ambulation trial was completed by a lap around the nurses station post Reglan. The pt feels a lot better. She is still unsteady on her feet but I think its because she was walking fast. After we slowed down her walking, she did better. She would like to go home. Provider notified.

## 2023-12-13 NOTE — Discharge Instructions (Addendum)
 I have sent meclizine  to your pharmacy please take as prescribed for dizziness.   Make sure staying well-hydrated with water and alternate Pedialyte and Gatorade.  Also sent Zofran  take as needed for nausea vomiting.  If the Zofran  does not work you can try the Reglan.  Follow-up with primary care.

## 2023-12-16 ENCOUNTER — Other Ambulatory Visit (HOSPITAL_COMMUNITY): Payer: Self-pay

## 2023-12-16 ENCOUNTER — Encounter: Payer: Self-pay | Admitting: Family Medicine

## 2023-12-16 ENCOUNTER — Ambulatory Visit (INDEPENDENT_AMBULATORY_CARE_PROVIDER_SITE_OTHER): Admitting: Family Medicine

## 2023-12-16 VITALS — BP 126/74 | HR 77 | Temp 97.2°F | Ht 67.0 in | Wt 168.0 lb

## 2023-12-16 DIAGNOSIS — R42 Dizziness and giddiness: Secondary | ICD-10-CM | POA: Diagnosis not present

## 2023-12-16 DIAGNOSIS — R7309 Other abnormal glucose: Secondary | ICD-10-CM

## 2023-12-16 LAB — BAYER DCA HB A1C WAIVED: HB A1C (BAYER DCA - WAIVED): 5.3 % (ref 4.8–5.6)

## 2023-12-16 MED ORDER — MECLIZINE HCL 25 MG PO TABS
25.0000 mg | ORAL_TABLET | Freq: Three times a day (TID) | ORAL | 1 refills | Status: DC | PRN
  Filled 2023-12-16 – 2024-01-13 (×4): qty 30, 10d supply, fill #0

## 2023-12-16 NOTE — Progress Notes (Signed)
 Subjective:  Patient ID: Katherine Hays, female    DOB: 1970-08-21, 53 y.o.   MRN: [id number]  Patient Care Team: Yevette Hem, FNP as PCP - General (Family Medicine) Ob/Gyn, [facility]   Chief Complaint:  ER follow up  (12/13/2023 (6 hours)/[place] [facility] at [facility]- dizziness )   HPI: Katherine Hays is a 53 y.o. female presenting on 12/16/2023 for ER follow up  (12/13/2023 (6 hours)/[place] [facility]- dizziness )   History of Present Illness   Katherine Hays "Katherine Hays" is a 53 year old female who presents with dizziness and vertigo.  She experiences severe dizziness that began abruptly when she woke up in the middle of the night, rendering her unable to walk. This episode led to an ER visit where a CT of the head was performed, showing normal results. Lab work was also conducted and appeared normal, although a few premature atrial contractions (PACs) were noted on her EKG.  She continues to experience dizziness, particularly when getting up and walking. She describes the sensation as 'just dizzy' without the room spinning. She has been trying to increase her water intake since the weekend. She is currently taking meclizine  regularly, which has helped reduce the severity of her symptoms, though they have not completely resolved.  She experienced a severe headache on Saturday and reported vomiting. She denies any falls associated with the dizziness. She started taking Zyrtec  on Saturday or Sunday and uses Flonase  regularly. She noticed her glucose levels were elevated, which is unusual for her, and an A1c test is planned to further investigate this finding.  No numbness, weakness, confusion, or falls associated with the dizziness. Vomiting occurred on Saturday.          Relevant past medical, surgical, family, and social history reviewed and updated as indicated.  Allergies and medications reviewed and  updated. Data reviewed: Chart in Epic.   Past Medical History:  Diagnosis Date   Anxiety    Arthritis    in neck, hip and knee   Asthma    GERD (gastroesophageal reflux disease)    Headache    migraines   IBS (irritable bowel syndrome)    Nerve pain    right leg   OCD (obsessive compulsive disorder)    PONV (postoperative nausea and vomiting)    Thyroid  disease    hypo   Vertigo     Past Surgical History:  Procedure Laterality Date   CHOLECYSTECTOMY N/A 05/19/2023   Procedure: LAPAROSCOPIC CHOLECYSTECTOMY;  Surgeon: Junie Olds, MD;  Location: [facility];  Service: General;  Laterality: N/A;   ROTATOR CUFF REPAIR Left 10/22/2021   TONSILLECTOMY     TUBAL LIGATION     WISDOM TOOTH EXTRACTION      Social History   Socioeconomic History   Marital status: Married    Spouse name: Not on file   Number of children: 3   Years of education: Not on file   Highest education level: Not on file  Occupational History   Occupation: Forensic scientist: [employer]  Tobacco Use   Smoking status: Never   Smokeless tobacco: Never  Vaping Use   Vaping status: Never Used  Substance and Sexual Activity   Alcohol use: No   Drug use: No   Sexual activity: Yes  Other Topics Concern   Not on file  Social History Narrative   Not on file  Social Drivers of Corporate investment banker Strain: Not on file  Food Insecurity: Not on file  Transportation Needs: Not on file  Physical Activity: Not on file  Stress: Not on file  Social Connections: Not on file  Intimate Partner Violence: Not on file    Outpatient Encounter Medications as of 12/16/2023  Medication Sig   albuterol  (VENTOLIN  HFA) 108 (90 Base) MCG/ACT inhaler Inhale 2 puffs into the lungs every 6 (six) hours as needed for wheezing or shortness of breath.   ALPRAZolam  (XANAX ) 0.5 MG tablet Take 1 tablet (0.5 mg total) by mouth 2 (two) times daily.   cetirizine  (ZYRTEC  ALLERGY) 10 MG tablet Take 1 tablet (10  mg total) by mouth at bedtime.   dicyclomine  (BENTYL ) 10 MG capsule Take 1 capsule (10 mg total) by mouth 2 (two) times daily. Take 30 minutes before breakfast and dinner   famotidine  (PEPCID ) 20 MG tablet Take 1 tablet (20 mg total) by mouth 2 (two) times daily.   fluticasone  (FLONASE ) 50 MCG/ACT nasal spray Place 1 spray into both nostrils daily. Begin by using 2 sprays in each nare daily for 3 to 5 days, then decrease to 1 spray in each nare daily.   ibuprofen  (ADVIL ) 200 MG tablet Take 400-600 mg by mouth every 6 (six) hours as needed for moderate pain.   levothyroxine  (SYNTHROID ) 50 MCG tablet Take 1 tablet (50 mcg total) by mouth daily before breakfast.   omeprazole  (PRILOSEC) 40 MG capsule Take 1 capsule (40 mg) by mouth 2 times daily.   ondansetron  (ZOFRAN -ODT) 4 MG disintegrating tablet Take 1 tablet (4 mg total) by mouth every 8 (eight) hours as needed for nausea or vomiting.   sertraline  (ZOLOFT ) 100 MG tablet Take 1 tablet (100 mg total) by mouth daily.   traZODone  (DESYREL ) 100 MG tablet Take 1 tablet (100 mg total) by mouth at bedtime.   [DISCONTINUED] meclizine  (ANTIVERT ) 25 MG tablet Take 1 tablet (25 mg total) by mouth 3 (three) times daily as needed for dizziness.   meclizine  (ANTIVERT ) 25 MG tablet Take 1 tablet (25 mg total) by mouth 3 (three) times daily as needed for dizziness.   metoCLOPramide (REGLAN) 10 MG tablet Take 0.5 tablets (5 mg total) by mouth once as needed for up to 1 dose for nausea. (Patient not taking: Reported on 12/16/2023)   NIFEdipine  (PROCARDIA -XL/NIFEDICAL-XL) 30 MG 24 hr tablet Take 1 tablet (30 mg total) by mouth daily. (Patient not taking: Reported on 12/16/2023)   [DISCONTINUED] benzonatate  (TESSALON ) 200 MG capsule Take 1 capsule (200 mg total) by mouth 3 (three) times daily as needed.   [DISCONTINUED] cefdinir  (OMNICEF ) 300 MG capsule Take 1 capsule (300 mg total) by mouth 2 (two) times daily. 1 po BID   [DISCONTINUED] guaifenesin  (HUMIBID E) 400 MG  TABS tablet Take 1 tablet 3 times daily as needed for chest congestion and cough   [DISCONTINUED] levonorgestrel  (MIRENA ) 20 MCG/24HR IUD 1 each by Intrauterine route once.   [DISCONTINUED] oxyCODONE -acetaminophen  (PERCOCET) 5-325 MG tablet Take 1 tablet by mouth every 4 (four) hours as needed for severe pain.   [DISCONTINUED] predniSONE  (DELTASONE ) 20 MG tablet 2 po at same time daily for 5 days   [DISCONTINUED] promethazine -dextromethorphan (PROMETHAZINE -DM) 6.25-15 MG/5ML syrup Take 5 mLs by mouth at bedtime as needed for cough.   No facility-administered encounter medications on file as of 12/16/2023.    Allergies  Allergen Reactions   Codeine Itching    Pertinent ROS per HPI, otherwise unremarkable  Objective:  BP 126/74   Pulse 77   Temp (!) 97.2 F (36.2 C)   Ht 5\' 7"  (1.702 m)   Wt 168 lb (76.2 kg)   SpO2 99%   BMI 26.31 kg/m    Wt Readings from Last 3 Encounters:  12/16/23 168 lb (76.2 kg)  12/13/23 165 lb (74.8 kg)  10/13/23 166 lb (75.3 kg)    Physical Exam Vitals and nursing note reviewed.  Constitutional:      Appearance: Normal appearance.  HENT:     Head: Normocephalic and atraumatic.     Right Ear: Tympanic membrane, ear canal and external ear normal.     Left Ear: Tympanic membrane, ear canal and external ear normal.     Nose: Nose normal.     Mouth/Throat:     Mouth: Mucous membranes are moist.  Eyes:     Extraocular Movements: Extraocular movements intact.     Conjunctiva/sclera: Conjunctivae normal.     Pupils: Pupils are equal, round, and reactive to light.  Cardiovascular:     Rate and Rhythm: Normal rate and regular rhythm.     Heart sounds: Normal heart sounds. No murmur heard.    No friction rub. No gallop.  Pulmonary:     Effort: Pulmonary effort is normal.     Breath sounds: Normal breath sounds.  Musculoskeletal:     Cervical back: Neck supple.     Right lower leg: No edema.     Left lower leg: No edema.  Skin:    General:  Skin is warm and dry.     Capillary Refill: Capillary refill takes less than 2 seconds.  Neurological:     General: No focal deficit present.     Mental Status: She is alert and oriented to person, place, and time.     Cranial Nerves: No cranial nerve deficit.     Sensory: No sensory deficit.     Motor: No weakness.     Coordination: Coordination normal.     Gait: Gait normal.     Deep Tendon Reflexes: Reflexes normal.  Psychiatric:        Mood and Affect: Mood normal.        Behavior: Behavior normal.        Thought Content: Thought content normal.        Judgment: Judgment normal.    Results for orders placed or performed during the hospital encounter of 12/13/23  Comprehensive metabolic panel   Collection Time: 12/13/23  6:19 PM  Result Value Ref Range   Sodium 139 135 - 145 mmol/L   Potassium 3.9 3.5 - 5.1 mmol/L   Chloride 104 98 - 111 mmol/L   CO2 26 22 - 32 mmol/L   Glucose, Bld 113 (H) 70 - 99 mg/dL   BUN 8 6 - 20 mg/dL   Creatinine, Ser 1.30 0.44 - 1.00 mg/dL   Calcium 9.7 8.9 - 86.5 mg/dL   Total Protein 7.8 6.5 - 8.1 g/dL   Albumin 4.5 3.5 - 5.0 g/dL   AST 19 15 - 41 U/L   ALT 15 0 - 44 U/L   Alkaline Phosphatase 72 38 - 126 U/L   Total Bilirubin 0.9 0.0 - 1.2 mg/dL   GFR, Estimated >78 >46 mL/min   Anion gap 9 5 - 15  CBC with Diff   Collection Time: 12/13/23  6:19 PM  Result Value Ref Range   WBC 10.0 4.0 - 10.5 K/uL   RBC 4.94 3.87 -  5.11 MIL/uL   Hemoglobin 14.3 12.0 - 15.0 g/dL   HCT 16.1 09.6 - 04.5 %   MCV 89.7 80.0 - 100.0 fL   MCH 28.9 26.0 - 34.0 pg   MCHC 32.3 30.0 - 36.0 g/dL   RDW 40.9 81.1 - 91.4 %   Platelets 325 150 - 400 K/uL   nRBC 0.0 0.0 - 0.2 %   Neutrophils Relative % 76 %   Neutro Abs 7.6 1.7 - 7.7 K/uL   Lymphocytes Relative 14 %   Lymphs Abs 1.4 0.7 - 4.0 K/uL   Monocytes Relative 7 %   Monocytes Absolute 0.7 0.1 - 1.0 K/uL   Eosinophils Relative 1 %   Eosinophils Absolute 0.1 0.0 - 0.5 K/uL   Basophils Relative 1 %    Basophils Absolute 0.1 0.0 - 0.1 K/uL   Immature Granulocytes 1 %   Abs Immature Granulocytes 0.05 0.00 - 0.07 K/uL  Urinalysis, Routine w reflex microscopic -Urine, Clean Catch   Collection Time: 12/13/23  7:40 PM  Result Value Ref Range   Color, Urine YELLOW YELLOW   APPearance HAZY (A) CLEAR   Specific Gravity, Urine 1.021 1.005 - 1.030   pH 8.0 5.0 - 8.0   Glucose, UA NEGATIVE NEGATIVE mg/dL   Hgb urine dipstick NEGATIVE NEGATIVE   Bilirubin Urine NEGATIVE NEGATIVE   Ketones, ur 5 (A) NEGATIVE mg/dL   Protein, ur 782 (A) NEGATIVE mg/dL   Nitrite NEGATIVE NEGATIVE   Leukocytes,Ua SMALL (A) NEGATIVE   RBC / HPF 0-5 0 - 5 RBC/hpf   WBC, UA >50 0 - 5 WBC/hpf   Bacteria, UA NONE SEEN NONE SEEN   Squamous Epithelial / HPF 11-20 0 - 5 /HPF   Mucus PRESENT   POC urine preg, ED   Collection Time: 12/13/23  7:41 PM  Result Value Ref Range   Preg Test, Ur Negative Negative       Pertinent labs & imaging results that were available during my care of the patient were reviewed by me and considered in my medical decision making.  Assessment & Plan:  Katherine Hays "Katherine Hays" was seen today for er follow up .  Diagnoses and all orders for this visit:  Vertigo -     meclizine  (ANTIVERT ) 25 MG tablet; Take 1 tablet (25 mg total) by mouth 3 (three) times daily as needed for dizziness.  Elevated glucose -     Bayer DCA Hb A1c Waived     Assessment and Plan    Vertigo Acute vertigo with dizziness upon positional changes, accompanied by nystagmus and vomiting. Likely benign positional vertigo, exacerbated by dehydration and possibly allergies. No falls reported. Head CT normal, no significant findings on EKG. Meclizine  providing partial relief. Discussed the importance of hydration and allergy management to alleviate symptoms. If symptoms persist, vestibular rehabilitation may be necessary to reposition inner ear crystals. - Encourage increased hydration. - Continue meclizine  for the next few  days. - Prescribe Zyrtec  and Flonase  for allergy management. - If symptoms persist by Thursday, refer to vestibular rehabilitation.  Elevated glucose level Elevated glucose level noted, but no prior history of hyperglycemia. Possible transient elevation due to dietary intake or stress. Further evaluation needed to rule out persistent hyperglycemia. - Order A1c test to assess for persistent hyperglycemia.          Continue all other maintenance medications.  Follow up plan: Return if symptoms worsen or fail to improve.   Continue healthy lifestyle choices, including diet (rich in fruits, vegetables,  and lean proteins, and low in salt and simple carbohydrates) and exercise (at least 30 minutes of moderate physical activity daily).  Educational handout given for BPV  The above assessment and management plan was discussed with the patient. The patient verbalized understanding of and has agreed to the management plan. Patient is aware to call the clinic if they develop any new symptoms or if symptoms persist or worsen. Patient is aware when to return to the clinic for a follow-up visit. Patient educated on when it is appropriate to go to the emergency department.   Kattie Parrot, FNP-C [facility] [phone]

## 2023-12-29 ENCOUNTER — Other Ambulatory Visit (HOSPITAL_COMMUNITY): Payer: Self-pay

## 2023-12-30 ENCOUNTER — Other Ambulatory Visit: Payer: Self-pay

## 2023-12-30 ENCOUNTER — Encounter: Payer: Self-pay | Admitting: Pharmacist

## 2023-12-31 ENCOUNTER — Other Ambulatory Visit (HOSPITAL_COMMUNITY): Payer: Self-pay

## 2024-01-01 ENCOUNTER — Other Ambulatory Visit (HOSPITAL_COMMUNITY): Payer: Self-pay

## 2024-01-06 ENCOUNTER — Other Ambulatory Visit (HOSPITAL_COMMUNITY): Payer: Self-pay

## 2024-01-06 ENCOUNTER — Other Ambulatory Visit: Payer: Self-pay

## 2024-01-09 ENCOUNTER — Other Ambulatory Visit: Payer: Self-pay

## 2024-01-13 ENCOUNTER — Other Ambulatory Visit (HOSPITAL_COMMUNITY): Payer: Self-pay

## 2024-01-13 DIAGNOSIS — Z01419 Encounter for gynecological examination (general) (routine) without abnormal findings: Secondary | ICD-10-CM | POA: Diagnosis not present

## 2024-01-13 DIAGNOSIS — Z1231 Encounter for screening mammogram for malignant neoplasm of breast: Secondary | ICD-10-CM | POA: Diagnosis not present

## 2024-02-09 ENCOUNTER — Other Ambulatory Visit: Payer: Self-pay

## 2024-02-09 ENCOUNTER — Other Ambulatory Visit (HOSPITAL_COMMUNITY): Payer: Self-pay

## 2024-02-09 ENCOUNTER — Encounter: Payer: Self-pay | Admitting: Pharmacist

## 2024-02-10 ENCOUNTER — Other Ambulatory Visit (HOSPITAL_COMMUNITY): Payer: Self-pay

## 2024-02-10 ENCOUNTER — Other Ambulatory Visit: Payer: Self-pay

## 2024-03-08 ENCOUNTER — Other Ambulatory Visit: Payer: Self-pay | Admitting: Family Medicine

## 2024-03-08 MED ORDER — FLUCONAZOLE 150 MG PO TABS
150.0000 mg | ORAL_TABLET | Freq: Once | ORAL | 0 refills | Status: AC
Start: 2024-03-08 — End: 2024-03-08

## 2024-04-08 DIAGNOSIS — N950 Postmenopausal bleeding: Secondary | ICD-10-CM | POA: Diagnosis not present

## 2024-04-09 ENCOUNTER — Other Ambulatory Visit: Payer: Self-pay | Admitting: Family

## 2024-04-13 ENCOUNTER — Ambulatory Visit (INDEPENDENT_AMBULATORY_CARE_PROVIDER_SITE_OTHER): Admitting: Family

## 2024-04-13 ENCOUNTER — Encounter: Payer: Self-pay | Admitting: Family

## 2024-04-13 ENCOUNTER — Other Ambulatory Visit: Payer: Self-pay

## 2024-04-13 ENCOUNTER — Other Ambulatory Visit (HOSPITAL_COMMUNITY): Payer: Self-pay

## 2024-04-13 VITALS — BP 108/63 | HR 75 | Temp 97.8°F | Ht 67.0 in | Wt 170.6 lb

## 2024-04-13 DIAGNOSIS — K580 Irritable bowel syndrome with diarrhea: Secondary | ICD-10-CM | POA: Diagnosis not present

## 2024-04-13 DIAGNOSIS — Z0001 Encounter for general adult medical examination with abnormal findings: Secondary | ICD-10-CM

## 2024-04-13 DIAGNOSIS — G4700 Insomnia, unspecified: Secondary | ICD-10-CM | POA: Diagnosis not present

## 2024-04-13 DIAGNOSIS — R5383 Other fatigue: Secondary | ICD-10-CM | POA: Diagnosis not present

## 2024-04-13 DIAGNOSIS — K219 Gastro-esophageal reflux disease without esophagitis: Secondary | ICD-10-CM | POA: Diagnosis not present

## 2024-04-13 DIAGNOSIS — Z8659 Personal history of other mental and behavioral disorders: Secondary | ICD-10-CM | POA: Diagnosis not present

## 2024-04-13 DIAGNOSIS — E559 Vitamin D deficiency, unspecified: Secondary | ICD-10-CM | POA: Diagnosis not present

## 2024-04-13 DIAGNOSIS — Z0000 Encounter for general adult medical examination without abnormal findings: Secondary | ICD-10-CM

## 2024-04-13 DIAGNOSIS — E785 Hyperlipidemia, unspecified: Secondary | ICD-10-CM

## 2024-04-13 DIAGNOSIS — F411 Generalized anxiety disorder: Secondary | ICD-10-CM

## 2024-04-13 DIAGNOSIS — E039 Hypothyroidism, unspecified: Secondary | ICD-10-CM | POA: Diagnosis not present

## 2024-04-13 MED ORDER — OMEPRAZOLE 40 MG PO CPDR
40.0000 mg | DELAYED_RELEASE_CAPSULE | Freq: Every day | ORAL | 2 refills | Status: AC
  Filled 2024-04-13: qty 90, 90d supply, fill #0
  Filled 2024-06-03 – 2024-08-27 (×2): qty 90, 90d supply, fill #1

## 2024-04-13 MED ORDER — FAMOTIDINE 20 MG PO TABS
20.0000 mg | ORAL_TABLET | Freq: Two times a day (BID) | ORAL | 2 refills | Status: AC
  Filled 2024-04-13: qty 180, 90d supply, fill #0
  Filled 2024-06-03 – 2024-08-27 (×2): qty 180, 90d supply, fill #1

## 2024-04-13 MED ORDER — DICYCLOMINE HCL 10 MG PO CAPS
10.0000 mg | ORAL_CAPSULE | Freq: Two times a day (BID) | ORAL | 3 refills | Status: AC
  Filled 2024-04-13: qty 180, 90d supply, fill #0
  Filled 2024-06-03 – 2024-08-27 (×2): qty 180, 90d supply, fill #1

## 2024-04-13 MED ORDER — FLUTICASONE PROPIONATE 50 MCG/ACT NA SUSP
1.0000 | Freq: Every day | NASAL | 2 refills | Status: AC
  Filled 2024-04-13: qty 16, 55d supply, fill #0
  Filled 2024-06-03: qty 16, 55d supply, fill #1
  Filled 2024-08-27: qty 16, 55d supply, fill #2

## 2024-04-13 MED ORDER — TRAZODONE HCL 100 MG PO TABS
100.0000 mg | ORAL_TABLET | Freq: Every day | ORAL | 3 refills | Status: AC
  Filled 2024-04-13: qty 90, 90d supply, fill #0
  Filled 2024-06-03 – 2024-08-27 (×2): qty 90, 90d supply, fill #1

## 2024-04-13 MED ORDER — SERTRALINE HCL 100 MG PO TABS
100.0000 mg | ORAL_TABLET | Freq: Every day | ORAL | 4 refills | Status: AC
  Filled 2024-04-13 – 2024-06-03 (×2): qty 90, 90d supply, fill #0
  Filled 2024-08-27: qty 90, 90d supply, fill #1

## 2024-04-13 MED ORDER — LEVOTHYROXINE SODIUM 50 MCG PO TABS
50.0000 ug | ORAL_TABLET | Freq: Every day | ORAL | 3 refills | Status: AC
  Filled 2024-04-13 – 2024-06-03 (×2): qty 90, 90d supply, fill #0
  Filled 2024-08-27: qty 30, 30d supply, fill #1

## 2024-04-13 NOTE — Patient Instructions (Signed)

## 2024-04-13 NOTE — Progress Notes (Signed)
 Subjective:    Patient ID: Katherine Hays, female    DOB: [birth year], 53 y.o.   MRN: [id number]  Chief Complaint  Patient presents with   Annual Exam   Pt presents to the office today for CPE without pap.   She is followed by GYN annually. She is having abnormal menstrual bleeding. Had an endometrial biopsy 04/08/24.  Followed by GI as needed for GERD.  Gastroesophageal Reflux She complains of belching and heartburn. This is a chronic problem. The current episode started more than 1 year ago. The problem occurs frequently. The symptoms are aggravated by certain foods. Associated symptoms include fatigue. She has tried a PPI and an herbal remedy for the symptoms. The treatment provided moderate relief.  Thyroid  Problem Presents for follow-up visit. Symptoms include anxiety, diarrhea, dry skin and fatigue. Patient reports no constipation. The symptoms have been stable.  Insomnia Primary symptoms: fragmented sleep, difficulty falling asleep.   The current episode started more than one year. The onset quality is gradual. The problem occurs intermittently. Past treatments include medication. The treatment provided moderate relief.  Hyperlipidemia This is a chronic problem. The current episode started more than 1 year ago. The problem is uncontrolled. Recent lipid tests were reviewed and are high. Current antihyperlipidemic treatment includes diet change. The current treatment provides mild improvement of lipids. Risk factors for coronary artery disease include dyslipidemia, hypertension, a sedentary lifestyle and post-menopausal.  Anxiety Presents for follow-up visit. Symptoms include excessive worry, insomnia, irritability, nervous/anxious behavior and restlessness. Symptoms occur occasionally. The severity of symptoms is mild.        Review of Systems  Constitutional:  Positive for fatigue and irritability.  Gastrointestinal:  Positive for diarrhea and heartburn. Negative for  constipation.  Psychiatric/Behavioral:  The patient is nervous/anxious and has insomnia.   All other systems reviewed and are negative.   Family History  Problem Relation Age of Onset   Hyperthyroidism Mother    Liver disease Father        cirrhosis   Hypertension Brother    Diabetes Maternal Grandfather    Stroke Maternal Grandfather    Breast cancer Paternal Grandmother    Cancer Paternal Grandmother        breast   Heart disease Paternal Grandfather    Stroke Paternal Grandfather    Pancreatic cancer Maternal Uncle    Crohn's disease Other        Nephew   Colon cancer Neg Hx    Stomach cancer Neg Hx    Esophageal cancer Neg Hx    Social History   Socioeconomic History   Marital status: Married    Spouse name: Not on file   Number of children: 3   Years of education: Not on file   Highest education level: Not on file  Occupational History   Occupation: Forensic scientist: [employer]  Tobacco Use   Smoking status: Never   Smokeless tobacco: Never  Vaping Use   Vaping status: Never Used  Substance and Sexual Activity   Alcohol use: No   Drug use: No   Sexual activity: Yes  Other Topics Concern   Not on file  Social History Narrative   Not on file   Social Drivers of Health   Financial Resource Strain: Not on file  Food Insecurity: Not on file  Transportation Needs: Not on file  Physical Activity: Not on file  Stress: Not on file  Social Connections: Not on file  Objective:   Physical Exam Vitals reviewed.  Constitutional:      General: She is not in acute distress.    Appearance: She is well-developed.  HENT:     Head: Normocephalic and atraumatic.     Right Ear: Tympanic membrane normal.     Left Ear: Tympanic membrane normal.  Eyes:     Pupils: Pupils are equal, round, and reactive to light.  Neck:     Thyroid : No thyromegaly.  Cardiovascular:     Rate and Rhythm: Normal rate and regular rhythm.     Heart sounds: Normal heart  sounds. No murmur heard. Pulmonary:     Effort: Pulmonary effort is normal. No respiratory distress.     Breath sounds: Normal breath sounds. No wheezing.  Abdominal:     General: Bowel sounds are normal. There is no distension.     Palpations: Abdomen is soft.     Tenderness: There is abdominal tenderness (RUQ tenderness).  Musculoskeletal:        General: No tenderness. Normal range of motion.     Cervical back: Normal range of motion and neck supple.  Skin:    General: Skin is warm and dry.  Neurological:     Mental Status: She is alert and oriented to person, place, and time.     Cranial Nerves: No cranial nerve deficit.     Deep Tendon Reflexes: Reflexes are normal and symmetric.  Psychiatric:        Behavior: Behavior normal.        Thought Content: Thought content normal.        Judgment: Judgment normal.       BP 108/63   Pulse 75   Temp 97.8 F (36.6 C) (Temporal)   Ht 5' 7 (1.702 m)   Wt 170 lb 9.6 oz (77.4 kg)   BMI 26.72 kg/m      Assessment & Plan:  SAHITI JOSWICK comes in today with chief complaint of Annual Exam   Diagnosis and orders addressed:  1. Annual physical exam (Primary) - CMP14+EGFR - CBC with Differential/Platelet - Lipid panel - TSH  2. Other fatigue - CMP14+EGFR - CBC with Differential/Platelet  3. GAD (generalized anxiety disorder) - CMP14+EGFR - CBC with Differential/Platelet - sertraline  (ZOLOFT ) 100 MG tablet; Take 1 tablet (100 mg total) by mouth daily.  Dispense: 90 tablet; Refill: 4  4. Insomnia, unspecified type - CMP14+EGFR - CBC with Differential/Platelet - traZODone  (DESYREL ) 100 MG tablet; Take 1 tablet (100 mg total) by mouth at bedtime.  Dispense: 90 tablet; Refill: 3 - sertraline  (ZOLOFT ) 100 MG tablet; Take 1 tablet (100 mg total) by mouth daily.  Dispense: 90 tablet; Refill: 4  5. Vitamin D  deficiency - CMP14+EGFR - CBC with Differential/Platelet  6. Hypothyroidism, unspecified type - CMP14+EGFR -  CBC with Differential/Platelet - TSH - levothyroxine  (SYNTHROID ) 50 MCG tablet; Take 1 tablet (50 mcg total) by mouth daily before breakfast.  Dispense: 90 tablet; Refill: 3  7. Hyperlipidemia, unspecified hyperlipidemia type - CMP14+EGFR - CBC with Differential/Platelet - Lipid panel  8. Gastroesophageal reflux disease, unspecified whether esophagitis present - CMP14+EGFR - CBC with Differential/Platelet - omeprazole  (PRILOSEC) 40 MG capsule; Take 1 capsule (40 mg total) by mouth daily.  Dispense: 90 capsule; Refill: 2  9. History of OCD (obsessive compulsive disorder) - CMP14+EGFR - CBC with Differential/Platelet - sertraline  (ZOLOFT ) 100 MG tablet; Take 1 tablet (100 mg total) by mouth daily.  Dispense: 90 tablet; Refill: 4 - famotidine  (PEPCID ) 20 MG  tablet; Take 1 tablet (20 mg total) by mouth 2 (two) times daily.  Dispense: 180 tablet; Refill: 2   12. Irritable bowel syndrome with diarrhea - CMP14+EGFR - CBC with Differential/Platelet - dicyclomine  (BENTYL ) 10 MG capsule; Take 1 capsule (10 mg total) by mouth 2 (two) times daily. Take 30 minutes before breakfast and dinner  Dispense: 180 capsule; Refill: 3    Labs pending Continue current medications  Patient reviewed in  controlled database, no flags noted. Contract and drug screen are up to date.  Health Maintenance reviewed Diet and exercise encouraged  Follow up plan: 6 months    Bari Learn, FNP

## 2024-04-14 ENCOUNTER — Encounter: Payer: Self-pay | Admitting: Obstetrics

## 2024-04-14 LAB — CBC WITH DIFFERENTIAL/PLATELET
Basophils Absolute: 0.1 x10E3/uL (ref 0.0–0.2)
Basos: 1 %
EOS (ABSOLUTE): 0.2 x10E3/uL (ref 0.0–0.4)
Eos: 4 %
Hematocrit: 40.9 % (ref 34.0–46.6)
Hemoglobin: 13 g/dL (ref 11.1–15.9)
Immature Grans (Abs): 0 x10E3/uL (ref 0.0–0.1)
Immature Granulocytes: 0 %
Lymphocytes Absolute: 2 x10E3/uL (ref 0.7–3.1)
Lymphs: 36 %
MCH: 29.1 pg (ref 26.6–33.0)
MCHC: 31.8 g/dL (ref 31.5–35.7)
MCV: 92 fL (ref 79–97)
Monocytes Absolute: 0.5 x10E3/uL (ref 0.1–0.9)
Monocytes: 9 %
Neutrophils Absolute: 2.8 x10E3/uL (ref 1.4–7.0)
Neutrophils: 50 %
Platelets: 288 x10E3/uL (ref 150–450)
RBC: 4.47 x10E6/uL (ref 3.77–5.28)
RDW: 11.9 % (ref 11.7–15.4)
WBC: 5.6 x10E3/uL (ref 3.4–10.8)

## 2024-04-14 LAB — LIPID PANEL
Chol/HDL Ratio: 4.2 ratio (ref 0.0–4.4)
Cholesterol, Total: 191 mg/dL (ref 100–199)
HDL: 45 mg/dL (ref >39)
LDL Chol Calc (NIH): 130 mg/dL — ABNORMAL HIGH (ref 0–99)
Triglycerides: 85 mg/dL (ref 0–149)
VLDL Cholesterol Cal: 16 mg/dL (ref 5–40)

## 2024-04-14 LAB — CMP14+EGFR
ALT: 12 IU/L (ref 0–32)
AST: 16 IU/L (ref 0–40)
Albumin: 4.4 g/dL (ref 3.8–4.9)
Alkaline Phosphatase: 66 IU/L (ref 44–121)
BUN/Creatinine Ratio: 14 (ref 9–23)
BUN: 10 mg/dL (ref 6–24)
Bilirubin Total: 0.2 mg/dL (ref 0.0–1.2)
CO2: 23 mmol/L (ref 20–29)
Calcium: 9 mg/dL (ref 8.7–10.2)
Chloride: 103 mmol/L (ref 96–106)
Creatinine, Ser: 0.74 mg/dL (ref 0.57–1.00)
Globulin, Total: 2.3 g/dL (ref 1.5–4.5)
Glucose: 85 mg/dL (ref 70–99)
Potassium: 4.6 mmol/L (ref 3.5–5.2)
Sodium: 141 mmol/L (ref 134–144)
Total Protein: 6.7 g/dL (ref 6.0–8.5)
eGFR: 95 mL/min/1.73 (ref >59)

## 2024-04-14 LAB — TSH: TSH: 3.73 u[IU]/mL (ref 0.450–4.500)

## 2024-04-15 ENCOUNTER — Ambulatory Visit: Payer: Self-pay | Admitting: Family

## 2024-04-20 ENCOUNTER — Encounter: Admitting: Family

## 2024-06-03 ENCOUNTER — Other Ambulatory Visit: Payer: Self-pay

## 2024-06-03 ENCOUNTER — Other Ambulatory Visit (HOSPITAL_COMMUNITY): Payer: Self-pay

## 2024-08-06 DIAGNOSIS — H43812 Vitreous degeneration, left eye: Secondary | ICD-10-CM | POA: Diagnosis not present

## 2024-08-13 ENCOUNTER — Other Ambulatory Visit: Payer: Self-pay

## 2024-08-13 MED ORDER — FLUCONAZOLE 150 MG PO TABS: ORAL_TABLET | ORAL | 0 refills | Status: AC

## 2024-08-27 ENCOUNTER — Other Ambulatory Visit: Payer: Self-pay

## 2024-08-27 ENCOUNTER — Other Ambulatory Visit (HOSPITAL_COMMUNITY): Payer: Self-pay

## 2024-09-17 ENCOUNTER — Other Ambulatory Visit: Payer: Self-pay | Admitting: Family Medicine

## 2024-09-17 ENCOUNTER — Other Ambulatory Visit

## 2024-09-17 DIAGNOSIS — L509 Urticaria, unspecified: Secondary | ICD-10-CM

## 2024-09-21 ENCOUNTER — Ambulatory Visit (INDEPENDENT_AMBULATORY_CARE_PROVIDER_SITE_OTHER): Admitting: Nurse Practitioner

## 2024-09-21 ENCOUNTER — Ambulatory Visit: Payer: Self-pay | Admitting: Family

## 2024-09-21 ENCOUNTER — Encounter: Payer: Self-pay | Admitting: Nurse Practitioner

## 2024-09-21 VITALS — BP 95/66 | HR 69 | Temp 97.1°F | Ht 67.0 in | Wt 170.0 lb

## 2024-09-21 DIAGNOSIS — B001 Herpesviral vesicular dermatitis: Secondary | ICD-10-CM

## 2024-09-21 DIAGNOSIS — J3489 Other specified disorders of nose and nasal sinuses: Secondary | ICD-10-CM

## 2024-09-21 LAB — ALPHA-GAL PANEL
Allergen Lamb IgE: <0.1 kU/L
Beef IgE: <0.1 kU/L
IgE (Immunoglobulin E), Serum: 31 [IU]/mL (ref 6–495)
O215-IgE Alpha-Gal: <0.1 kU/L
Pork IgE: <0.1 kU/L

## 2024-09-21 MED ORDER — CEPHALEXIN 500 MG PO CAPS: 500.0000 mg | ORAL_CAPSULE | Freq: Three times a day (TID) | ORAL | 0 refills | Status: AC

## 2024-09-21 MED ORDER — ACYCLOVIR 5 % EX OINT: 1.0000 | TOPICAL_OINTMENT | CUTANEOUS | 1 refills | Status: AC

## 2024-09-21 NOTE — Patient Instructions (Signed)
 Cold Sore    A cold sore, also called a fever blister, is a small, fluid-filled sore that forms inside of the mouth or on the lips, gums, nose, chin, or cheeks. Cold sores can spread to other parts of the body, such as the eyes, fingers, or genitals.  Cold sores can spread from person to person (are contagious) until the sores crust over completely. Most cold sores go away within 2 weeks.  What are the causes?  Cold sores are caused by a virus (herpes simplex virus type 1, HSV-1). The virus can spread from person to person through close contact, such as through:  Kissing.  Touching the affected area.  Sharing personal items such as lip balm, razors, a drinking glass, or eating utensils.  What increases the risk?  Being tired, stressed, or sick.  Having your period (menstruating).  Being pregnant.  Taking certain medicines.  Being out in cold weather or getting too much sun.  What are the signs or symptoms?  Symptoms of a cold sore go through different stages:  Tingling, itching, or burning is felt 1-2 days before the cold sore appears.  Fluid-filled blisters appear on the lips, inside the mouth, on the nose, or on the cheeks.  The blisters start to ooze clear fluid.  The blisters dry up, and a yellow crust appears in their place.  The crust falls off.  In some cases, other symptoms can develop along with cold sores. These can include:  Fever.  Sore throat.  Headache.  Muscle aches.  Swollen neck glands.  How is this treated?  There is no cure for cold sores or the virus that causes them. There is also no vaccine to prevent the virus. Most cold sores go away on their own without treatment within 2 weeks. Your doctor may prescribe medicines to:  Help with pain.  Keep the virus from growing.  Help you heal faster.  Medicines may be in the form of creams, gels, pills, or a shot.  Follow these instructions at home:  Medicines  Take or apply over-the-counter and prescription medicines only as told by your doctor.  Use a  cotton-tip swab to apply creams or gels to your sores.  Ask your doctor if you can take lysine supplements. These may help with healing.  Sore care    Do not touch the sores or pick the scabs.  Wash your hands often with soap and water for at least 20 seconds. Do not touch your eyes without washing your hands first.  Keep the sores clean and dry.  If told, put ice on the sores. To do this:  Put ice in a plastic bag.  Place a towel between your skin and the bag.  Leave the ice on for 20 minutes, 2-3 times a day.  Take off the ice if your skin turns bright red. This is very important. If you cannot feel pain, heat, or cold, you have a greater risk of damage to the area.  Eating and drinking  Eat a soft, bland diet. Avoid eating hot, cold, or salty foods. These can hurt your mouth.  Use a straw if it hurts to drink out of a glass.  Eat foods that have a lot of lysine in them. These include meat, fish, and dairy products.  Avoid sugary foods, chocolates, nuts, and grains. These foods have a high amount of a substance (arginine) that can cause the virus to grow.  Lifestyle  Do not kiss, have oral sex,  or share personal items until your sores heal.  Stress, poor sleep, and being out in the sun can trigger a cold sore. Make sure you:  Do activities that help you relax, such as deep breathing exercises or meditation.  Get enough sleep.  Put sunscreen on your lips before you go out in the sun.  Contact a doctor if:  You have symptoms for more than 2 weeks.  You have pus coming from the sores.  You have redness that is spreading.  You have pain or irritation in your eye.  You get sores on your genitals.  Your sores do not heal within 2 weeks.  You get cold sores often.  Get help right away if:  You have a fever and your symptoms suddenly get worse.  You have a headache and confusion.  You have tiredness (fatigue).  You do not want to eat as much as normal (loss of appetite).  You have a stiff neck or are sensitive to  light.  Summary  A cold sore is a small, fluid-filled sore that forms inside of the mouth or on the lips, gums, nose, chin, or cheeks.  Cold sores can spread from person to person (are contagious) until the sores crust over completely. Most cold sores go away within 2 weeks.  Wash your hands often. Do not touch your eyes without washing your hands first.  Do not kiss, have oral sex, or share personal items until your sores heal.  Contact a doctor if your sores do not heal within 2 weeks.  This information is not intended to replace advice given to you by your health care provider. Make sure you discuss any questions you have with your health care provider.  Document Revised: 05/16/2021 Document Reviewed: 05/16/2021  [employer] Patient Education  2024 [employer].
